# Patient Record
Sex: Female | Born: 1950 | Race: White | Hispanic: No | Marital: Married | Smoking: Current every day smoker
Health system: Southern US, Community
[De-identification: ages and names within clinical notes are randomized; demographics above are authoritative.]

## PROBLEM LIST (undated history)

## (undated) DIAGNOSIS — F419 Anxiety disorder, unspecified: Secondary | ICD-10-CM

## (undated) DIAGNOSIS — Z9181 History of falling: Secondary | ICD-10-CM

## (undated) DIAGNOSIS — R918 Other nonspecific abnormal finding of lung field: Secondary | ICD-10-CM

## (undated) DIAGNOSIS — Z95828 Presence of other vascular implants and grafts: Secondary | ICD-10-CM

## (undated) DIAGNOSIS — Z923 Personal history of irradiation: Secondary | ICD-10-CM

## (undated) DIAGNOSIS — D496 Neoplasm of unspecified behavior of brain: Secondary | ICD-10-CM

## (undated) HISTORY — PX: COLON SURGERY: SHX602

## (undated) HISTORY — PX: APPENDECTOMY: SHX54

---

## 2005-01-24 ENCOUNTER — Ambulatory Visit: Payer: Self-pay

## 2006-02-13 ENCOUNTER — Ambulatory Visit: Payer: Self-pay

## 2007-02-19 ENCOUNTER — Ambulatory Visit: Payer: Self-pay

## 2007-03-06 ENCOUNTER — Ambulatory Visit: Payer: Self-pay | Admitting: Internal Medicine

## 2008-01-13 ENCOUNTER — Emergency Department: Payer: Self-pay | Admitting: Emergency Medicine

## 2008-01-13 ENCOUNTER — Ambulatory Visit: Payer: Self-pay | Admitting: Internal Medicine

## 2008-03-10 ENCOUNTER — Ambulatory Visit: Payer: Self-pay

## 2009-03-12 ENCOUNTER — Ambulatory Visit: Payer: Self-pay

## 2009-12-15 ENCOUNTER — Emergency Department: Payer: Self-pay | Admitting: Emergency Medicine

## 2010-03-18 ENCOUNTER — Ambulatory Visit: Payer: Self-pay | Admitting: Internal Medicine

## 2010-03-29 ENCOUNTER — Ambulatory Visit: Payer: Self-pay

## 2010-04-09 ENCOUNTER — Ambulatory Visit: Payer: Self-pay | Admitting: Unknown Physician Specialty

## 2010-04-12 LAB — PATHOLOGY REPORT

## 2010-04-20 ENCOUNTER — Ambulatory Visit: Payer: Self-pay

## 2010-08-18 ENCOUNTER — Ambulatory Visit: Payer: Self-pay | Admitting: Unknown Physician Specialty

## 2010-08-20 LAB — PATHOLOGY REPORT

## 2011-04-20 ENCOUNTER — Ambulatory Visit: Payer: Self-pay

## 2012-02-02 ENCOUNTER — Ambulatory Visit: Payer: Self-pay | Admitting: Medical

## 2012-04-24 ENCOUNTER — Ambulatory Visit: Payer: Self-pay

## 2013-11-11 ENCOUNTER — Inpatient Hospital Stay (HOSPITAL_COMMUNITY): Payer: BC Managed Care – PPO

## 2013-11-11 ENCOUNTER — Inpatient Hospital Stay (HOSPITAL_COMMUNITY)
Admission: EM | Admit: 2013-11-11 | Discharge: 2013-12-06 | DRG: 826 | Disposition: A | Discharge status: Rehabilitation Facility | Payer: BC Managed Care – PPO | Source: Other Acute Inpatient Hospital | Attending: Hematology & Oncology | Admitting: Hematology & Oncology

## 2013-11-11 ENCOUNTER — Emergency Department: Payer: Self-pay | Admitting: Emergency Medicine

## 2013-11-11 ENCOUNTER — Encounter (HOSPITAL_COMMUNITY): Payer: Self-pay | Admitting: *Deleted

## 2013-11-11 DIAGNOSIS — C719 Malignant neoplasm of brain, unspecified: Secondary | ICD-10-CM

## 2013-11-11 DIAGNOSIS — G935 Compression of brain: Secondary | ICD-10-CM | POA: Diagnosis present

## 2013-11-11 DIAGNOSIS — E440 Moderate protein-calorie malnutrition: Secondary | ICD-10-CM | POA: Insufficient documentation

## 2013-11-11 DIAGNOSIS — R599 Enlarged lymph nodes, unspecified: Secondary | ICD-10-CM | POA: Diagnosis present

## 2013-11-11 DIAGNOSIS — Z79899 Other long term (current) drug therapy: Secondary | ICD-10-CM

## 2013-11-11 DIAGNOSIS — Z681 Body mass index (BMI) 19 or less, adult: Secondary | ICD-10-CM

## 2013-11-11 DIAGNOSIS — C3490 Malignant neoplasm of unspecified part of unspecified bronchus or lung: Secondary | ICD-10-CM

## 2013-11-11 DIAGNOSIS — R739 Hyperglycemia, unspecified: Secondary | ICD-10-CM | POA: Diagnosis present

## 2013-11-11 DIAGNOSIS — T380X5A Adverse effect of glucocorticoids and synthetic analogues, initial encounter: Secondary | ICD-10-CM

## 2013-11-11 DIAGNOSIS — W19XXXA Unspecified fall, initial encounter: Secondary | ICD-10-CM

## 2013-11-11 DIAGNOSIS — F411 Generalized anxiety disorder: Secondary | ICD-10-CM | POA: Diagnosis present

## 2013-11-11 DIAGNOSIS — R4182 Altered mental status, unspecified: Secondary | ICD-10-CM

## 2013-11-11 DIAGNOSIS — S0990XA Unspecified injury of head, initial encounter: Secondary | ICD-10-CM | POA: Diagnosis present

## 2013-11-11 DIAGNOSIS — R55 Syncope and collapse: Secondary | ICD-10-CM | POA: Diagnosis present

## 2013-11-11 DIAGNOSIS — J9600 Acute respiratory failure, unspecified whether with hypoxia or hypercapnia: Secondary | ICD-10-CM

## 2013-11-11 DIAGNOSIS — D496 Neoplasm of unspecified behavior of brain: Secondary | ICD-10-CM

## 2013-11-11 DIAGNOSIS — J189 Pneumonia, unspecified organism: Secondary | ICD-10-CM | POA: Diagnosis not present

## 2013-11-11 DIAGNOSIS — R918 Other nonspecific abnormal finding of lung field: Secondary | ICD-10-CM

## 2013-11-11 DIAGNOSIS — C7A090 Malignant carcinoid tumor of the bronchus and lung: Secondary | ICD-10-CM | POA: Diagnosis present

## 2013-11-11 DIAGNOSIS — Y838 Other surgical procedures as the cause of abnormal reaction of the patient, or of later complication, without mention of misadventure at the time of the procedure: Secondary | ICD-10-CM | POA: Diagnosis not present

## 2013-11-11 DIAGNOSIS — R7309 Other abnormal glucose: Secondary | ICD-10-CM | POA: Diagnosis present

## 2013-11-11 DIAGNOSIS — E43 Unspecified severe protein-calorie malnutrition: Secondary | ICD-10-CM

## 2013-11-11 DIAGNOSIS — Z9181 History of falling: Secondary | ICD-10-CM

## 2013-11-11 DIAGNOSIS — Z781 Physical restraint status: Secondary | ICD-10-CM | POA: Diagnosis not present

## 2013-11-11 DIAGNOSIS — G988 Other disorders of nervous system: Secondary | ICD-10-CM | POA: Diagnosis not present

## 2013-11-11 DIAGNOSIS — Z7982 Long term (current) use of aspirin: Secondary | ICD-10-CM

## 2013-11-11 DIAGNOSIS — R64 Cachexia: Secondary | ICD-10-CM

## 2013-11-11 DIAGNOSIS — C7931 Secondary malignant neoplasm of brain: Secondary | ICD-10-CM

## 2013-11-11 DIAGNOSIS — R636 Underweight: Secondary | ICD-10-CM

## 2013-11-11 DIAGNOSIS — C7949 Secondary malignant neoplasm of other parts of nervous system: Secondary | ICD-10-CM

## 2013-11-11 DIAGNOSIS — C7B8 Other secondary neuroendocrine tumors: Principal | ICD-10-CM | POA: Diagnosis present

## 2013-11-11 DIAGNOSIS — F17200 Nicotine dependence, unspecified, uncomplicated: Secondary | ICD-10-CM | POA: Diagnosis present

## 2013-11-11 DIAGNOSIS — I10 Essential (primary) hypertension: Secondary | ICD-10-CM

## 2013-11-11 DIAGNOSIS — E785 Hyperlipidemia, unspecified: Secondary | ICD-10-CM | POA: Diagnosis present

## 2013-11-11 DIAGNOSIS — G936 Cerebral edema: Secondary | ICD-10-CM | POA: Diagnosis present

## 2013-11-11 DIAGNOSIS — W1809XA Striking against other object with subsequent fall, initial encounter: Secondary | ICD-10-CM | POA: Diagnosis present

## 2013-11-11 DIAGNOSIS — G910 Communicating hydrocephalus: Secondary | ICD-10-CM | POA: Diagnosis present

## 2013-11-11 HISTORY — DX: Anxiety disorder, unspecified: F41.9

## 2013-11-11 HISTORY — DX: Neoplasm of unspecified behavior of brain: D49.6

## 2013-11-11 HISTORY — DX: History of falling: Z91.81

## 2013-11-11 HISTORY — DX: Other nonspecific abnormal finding of lung field: R91.8

## 2013-11-11 LAB — CBC
HCT: 40.6 % (ref 35.0–47.0)
HGB: 14 g/dL (ref 12.0–16.0)
MCH: 32.4 pg (ref 26.0–34.0)
MCHC: 34.4 g/dL (ref 32.0–36.0)
MCV: 94 fL (ref 80–100)
Platelet: 187 10*3/uL (ref 150–440)
RBC: 4.31 10*6/uL (ref 3.80–5.20)
RDW: 13.2 % (ref 11.5–14.5)
WBC: 7.6 10*3/uL (ref 3.6–11.0)

## 2013-11-11 LAB — COMPREHENSIVE METABOLIC PANEL
ALBUMIN: 3.7 g/dL (ref 3.4–5.0)
ANION GAP: 6 — AB (ref 7–16)
AST: 44 U/L — AB (ref 15–37)
Alkaline Phosphatase: 31 U/L — ABNORMAL LOW
BUN: 18 mg/dL (ref 7–18)
Bilirubin,Total: 1.3 mg/dL — ABNORMAL HIGH (ref 0.2–1.0)
CHLORIDE: 105 mmol/L (ref 98–107)
CO2: 24 mmol/L (ref 21–32)
Calcium, Total: 8.9 mg/dL (ref 8.5–10.1)
Creatinine: 0.69 mg/dL (ref 0.60–1.30)
EGFR (African American): >60
Glucose: 88 mg/dL (ref 65–99)
Osmolality: 271 (ref 275–301)
POTASSIUM: 4 mmol/L (ref 3.5–5.1)
SGPT (ALT): 36 U/L (ref 12–78)
SODIUM: 135 mmol/L — AB (ref 136–145)
TOTAL PROTEIN: 6.6 g/dL (ref 6.4–8.2)

## 2013-11-11 LAB — MRSA PCR SCREENING: MRSA by PCR: NEGATIVE

## 2013-11-11 LAB — PROTIME-INR
INR: 1
Prothrombin Time: 13.1 secs (ref 11.5–14.7)

## 2013-11-11 MED ORDER — GADOBENATE DIMEGLUMINE 529 MG/ML IV SOLN
10.0000 mL | Freq: Once | INTRAVENOUS | Status: AC | PRN
  Administered 2013-11-11: 10 mL via INTRAVENOUS

## 2013-11-11 MED ORDER — FAMOTIDINE IN NACL 20-0.9 MG/50ML-% IV SOLN
20.0000 mg | Freq: Two times a day (BID) | INTRAVENOUS | Status: DC
  Administered 2013-11-11 – 2013-11-14 (×7): 20 mg via INTRAVENOUS
  Filled 2013-11-11 (×9): qty 50

## 2013-11-11 MED ORDER — ONDANSETRON HCL 4 MG/2ML IJ SOLN: 4.0000 mg | Freq: Four times a day (QID) | INTRAMUSCULAR | Status: DC | PRN

## 2013-11-11 MED ORDER — SODIUM CHLORIDE 0.9 % IV SOLN
INTRAVENOUS | Status: DC
  Administered 2013-11-11 – 2013-11-15 (×6): via INTRAVENOUS
  Administered 2013-11-17: 20 mL via INTRAVENOUS
  Administered 2013-11-20 – 2013-11-30 (×4): via INTRAVENOUS

## 2013-11-11 MED ORDER — ONDANSETRON HCL 4 MG PO TABS: 4.0000 mg | ORAL_TABLET | Freq: Four times a day (QID) | ORAL | Status: DC | PRN

## 2013-11-11 MED ORDER — DEXAMETHASONE SODIUM PHOSPHATE 10 MG/ML IJ SOLN
10.0000 mg | Freq: Four times a day (QID) | INTRAMUSCULAR | Status: DC
  Administered 2013-11-12 – 2013-11-26 (×58): 10 mg via INTRAVENOUS
  Filled 2013-11-11 (×70): qty 1

## 2013-11-11 MED ORDER — LORAZEPAM BOLUS VIA INFUSION
0.5000 mg | INTRAVENOUS | Status: DC | PRN
  Filled 2013-11-11: qty 1

## 2013-11-11 MED ORDER — LORAZEPAM 2 MG/ML IJ SOLN
0.5000 mg | INTRAMUSCULAR | Status: DC | PRN
  Administered 2013-11-11: 0.5 mg via INTRAVENOUS
  Filled 2013-11-11: qty 1

## 2013-11-11 NOTE — H&P (Signed)
Katie [name] is an 63 y.o. female.   Chief Complaint: New diagnosis brain tumor HPI: Patient is a 63 year old right-handed female who apparently had a syncopal episode today falling and hitting her head in the left frontal region. A CT scan of the brain demonstrates at least 2 large right-sided lesions one frontally and one parietal occipitally there is 12 mm of shift with subfalcine herniation. Was contacted regarding the patient's condition noting that she was somewhat disoriented.  In talking to the husband it appears that she has been failing in her normal activities of daily living for the last 6 weeks time. She did become confused disoriented had some speech difficulties and was not doing routine household chores. She was to see a neurologist in the next week's time.  The patient gives no history of having had any other cancerous lesions ever.  Past Medical History  Diagnosis Date  . Anxiety     takes lexapro    Past Surgical History  Procedure Laterality Date  . Appendectomy    . Colon surgery      polyps  removed    History reviewed. No pertinent family history. Social History:  reports that she has been smoking.  She does not have any smokeless tobacco history on file. Her alcohol and drug histories are not on file.  Allergies: No Known Allergies  No prescriptions prior to admission    No results found for this or any previous visit (from the past 48 hour(s)). No results found.  Review of Systems  Unable to perform ROS: mental acuity    Temperature 98.1 F (36.7 C), temperature source Oral, height 5\' 7"  (1.702 m), weight 53.9 kg (118 lb 13.3 oz). Physical Exam  Constitutional: She appears well-developed and well-nourished.  HENT:  Head: Normocephalic.  Small left frontal contusion and abrasion on scalp at coronal suture  Eyes: Conjunctivae and EOM are normal. Pupils are equal, round, and reactive to light.  Neck: Normal range of motion. Neck supple.  Cardiovascular:  Normal rate, regular rhythm and normal heart sounds.   Respiratory: Effort normal and breath sounds normal.  GI: Soft. Bowel sounds are normal.  Musculoskeletal:  All 4 extremities move well. But not to command  Neurological: She has normal reflexes. No cranial nerve deficit.  Patient appears obtunded. Will not open eyes to painful stimuli but grimaces symmetrically. Moves arms purposefully on both sides. Moves legs spontaneously. Patient was given a half milligram of Ativan prior to transport to [place].  Skin: Skin is warm and dry.     Assessment/Plan New diagnosis of brain lesions in this 63 year old otherwise healthy individual.  She will require an MRI of the brain in addition to a CT of the chest abdomen and pelvis to look for primary lesion. The patient will require surgical removal of the largest lesion which is in the right parieto-occipital region. The MRI will demonstrated there are any other than these 2 notable lesions on the right side of her head.  Katie [name] J 11/11/2013, 9:53 PM

## 2013-11-12 ENCOUNTER — Inpatient Hospital Stay (HOSPITAL_COMMUNITY): Payer: BC Managed Care – PPO

## 2013-11-12 ENCOUNTER — Encounter (HOSPITAL_COMMUNITY): Payer: Self-pay | Admitting: Certified Registered Nurse Anesthetist

## 2013-11-12 ENCOUNTER — Inpatient Hospital Stay (HOSPITAL_COMMUNITY): Payer: BC Managed Care – PPO | Admitting: Certified Registered"

## 2013-11-12 ENCOUNTER — Encounter (HOSPITAL_COMMUNITY): Payer: BC Managed Care – PPO | Admitting: Anesthesiology

## 2013-11-12 ENCOUNTER — Encounter (HOSPITAL_COMMUNITY)
Admission: EM | Disposition: A | Discharge status: Rehabilitation Facility | Payer: Self-pay | Source: Other Acute Inpatient Hospital | Attending: Hematology & Oncology

## 2013-11-12 ENCOUNTER — Inpatient Hospital Stay (HOSPITAL_COMMUNITY): Payer: BC Managed Care – PPO | Admitting: Anesthesiology

## 2013-11-12 ENCOUNTER — Encounter (HOSPITAL_COMMUNITY): Payer: BC Managed Care – PPO | Admitting: Certified Registered"

## 2013-11-12 DIAGNOSIS — R4182 Altered mental status, unspecified: Secondary | ICD-10-CM

## 2013-11-12 DIAGNOSIS — J9600 Acute respiratory failure, unspecified whether with hypoxia or hypercapnia: Secondary | ICD-10-CM | POA: Diagnosis present

## 2013-11-12 DIAGNOSIS — C719 Malignant neoplasm of brain, unspecified: Secondary | ICD-10-CM

## 2013-11-12 HISTORY — PX: CRANIOTOMY: SHX93

## 2013-11-12 LAB — COMPREHENSIVE METABOLIC PANEL
ALT: 25 U/L (ref 0–35)
AST: 39 U/L — ABNORMAL HIGH (ref 0–37)
Albumin: 3.7 g/dL (ref 3.5–5.2)
Alkaline Phosphatase: 29 U/L — ABNORMAL LOW (ref 39–117)
BILIRUBIN TOTAL: 1.3 mg/dL — AB (ref 0.3–1.2)
BUN: 14 mg/dL (ref 6–23)
CHLORIDE: 100 meq/L (ref 96–112)
CO2: 22 mEq/L (ref 19–32)
CREATININE: 0.56 mg/dL (ref 0.50–1.10)
Calcium: 9.4 mg/dL (ref 8.4–10.5)
GFR calc Af Amer: >90 mL/min (ref >90)
GFR calc non Af Amer: >90 mL/min (ref >90)
Glucose, Bld: 138 mg/dL — ABNORMAL HIGH (ref 70–99)
Potassium: 4 mEq/L (ref 3.7–5.3)
Sodium: 138 mEq/L (ref 137–147)
Total Protein: 6.1 g/dL (ref 6.0–8.3)

## 2013-11-12 LAB — POCT I-STAT 7, (LYTES, BLD GAS, ICA,H+H)
ACID-BASE EXCESS: 4 mmol/L — AB (ref 0.0–2.0)
Acid-Base Excess: 1 mmol/L (ref 0.0–2.0)
Acid-base deficit: 1 mmol/L (ref 0.0–2.0)
BICARBONATE: 26.7 meq/L — AB (ref 20.0–24.0)
BICARBONATE: 26.9 meq/L — AB (ref 20.0–24.0)
BICARBONATE: 24.8 meq/L — AB (ref 20.0–24.0)
CALCIUM ION: 1.12 mmol/L — AB (ref 1.13–1.30)
CALCIUM ION: 1.16 mmol/L (ref 1.13–1.30)
Calcium, Ion: 1.13 mmol/L (ref 1.13–1.30)
HCT: 34 % — ABNORMAL LOW (ref 36.0–46.0)
HCT: 27 % — ABNORMAL LOW (ref 36.0–46.0)
HEMATOCRIT: 34 % — AB (ref 36.0–46.0)
HEMOGLOBIN: 11.6 g/dL — AB (ref 12.0–15.0)
HEMOGLOBIN: 11.6 g/dL — AB (ref 12.0–15.0)
HEMOGLOBIN: 9.2 g/dL — AB (ref 12.0–15.0)
O2 SAT: 100 %
O2 Saturation: 100 %
O2 Saturation: 100 %
PH ART: 7.556 — AB (ref 7.350–7.450)
PH ART: 7.385 (ref 7.350–7.450)
PH ART: 7.36 (ref 7.350–7.450)
PO2 ART: 472 mmHg — AB (ref 80.0–100.0)
Patient temperature: 36.3
Patient temperature: 36
Potassium: 3.4 mEq/L — ABNORMAL LOW (ref 3.7–5.3)
Potassium: 3.4 mEq/L — ABNORMAL LOW (ref 3.7–5.3)
Potassium: 3.3 mEq/L — ABNORMAL LOW (ref 3.7–5.3)
SODIUM: 134 meq/L — AB (ref 137–147)
Sodium: 131 mEq/L — ABNORMAL LOW (ref 137–147)
Sodium: 139 mEq/L (ref 137–147)
TCO2: 28 mmol/L (ref 0–100)
TCO2: 28 mmol/L (ref 0–100)
TCO2: 26 mmol/L (ref 0–100)
pCO2 arterial: 29.6 mmHg — ABNORMAL LOW (ref 35.0–45.0)
pCO2 arterial: 44.6 mmHg (ref 35.0–45.0)
pCO2 arterial: 43.4 mmHg (ref 35.0–45.0)
pO2, Arterial: 492 mmHg — ABNORMAL HIGH (ref 80.0–100.0)
pO2, Arterial: 518 mmHg — ABNORMAL HIGH (ref 80.0–100.0)

## 2013-11-12 LAB — GLUCOSE, CAPILLARY
GLUCOSE-CAPILLARY: 132 mg/dL — AB (ref 70–99)
GLUCOSE-CAPILLARY: 159 mg/dL — AB (ref 70–99)

## 2013-11-12 LAB — POCT I-STAT 3, ART BLOOD GAS (G3+)
ACID-BASE DEFICIT: 1 mmol/L (ref 0.0–2.0)
Acid-base deficit: 1 mmol/L (ref 0.0–2.0)
BICARBONATE: 23.7 meq/L (ref 20.0–24.0)
Bicarbonate: 24.4 mEq/L — ABNORMAL HIGH (ref 20.0–24.0)
O2 SAT: 100 %
O2 Saturation: 99 %
Patient temperature: 97.5
Patient temperature: 97.9
TCO2: 26 mmol/L (ref 0–100)
TCO2: 25 mmol/L (ref 0–100)
pCO2 arterial: 44 mmHg (ref 35.0–45.0)
pCO2 arterial: 37.8 mmHg (ref 35.0–45.0)
pH, Arterial: 7.349 — ABNORMAL LOW (ref 7.350–7.450)
pH, Arterial: 7.403 (ref 7.350–7.450)
pO2, Arterial: 367 mmHg — ABNORMAL HIGH (ref 80.0–100.0)
pO2, Arterial: 117 mmHg — ABNORMAL HIGH (ref 80.0–100.0)

## 2013-11-12 LAB — MAGNESIUM: Magnesium: 1.8 mg/dL (ref 1.5–2.5)

## 2013-11-12 LAB — PREPARE RBC (CROSSMATCH)

## 2013-11-12 LAB — ABO/RH: ABO/RH(D): A NEG

## 2013-11-12 LAB — PHOSPHORUS: Phosphorus: 3.7 mg/dL (ref 2.3–4.6)

## 2013-11-12 SURGERY — CRANIOTOMY HEMATOMA EVACUATION SUBDURAL
Anesthesia: General | Site: Head | Laterality: Right

## 2013-11-12 MED ORDER — ROCURONIUM BROMIDE 50 MG/5ML IV SOLN
50.0000 mg | INTRAVENOUS | Status: DC
  Filled 2013-11-12: qty 5

## 2013-11-12 MED ORDER — BUPIVACAINE HCL (PF) 0.5 % IJ SOLN
INTRAMUSCULAR | Status: DC | PRN
  Administered 2013-11-12: 5 mL

## 2013-11-12 MED ORDER — MANNITOL 25 % IV SOLN
INTRAVENOUS | Status: AC
  Filled 2013-11-12: qty 150

## 2013-11-12 MED ORDER — SODIUM CHLORIDE 0.9 % IV SOLN
INTRAVENOUS | Status: DC | PRN
  Administered 2013-11-12: 08:00:00 via INTRAVENOUS

## 2013-11-12 MED ORDER — FENTANYL CITRATE 0.05 MG/ML IJ SOLN
200.0000 ug | Freq: Once | INTRAMUSCULAR | Status: AC
  Administered 2013-11-12: 200 ug via INTRAVENOUS

## 2013-11-12 MED ORDER — PANTOPRAZOLE SODIUM 40 MG IV SOLR
40.0000 mg | Freq: Every day | INTRAVENOUS | Status: DC
  Administered 2013-11-12 – 2013-11-14 (×3): 40 mg via INTRAVENOUS
  Filled 2013-11-12 (×4): qty 40

## 2013-11-12 MED ORDER — MANNITOL 25 % IV SOLN
INTRAVENOUS | Status: AC
  Filled 2013-11-12: qty 50

## 2013-11-12 MED ORDER — SODIUM CHLORIDE 0.9 % IV SOLN
INTRAVENOUS | Status: DC
Start: 2013-11-12 — End: 2013-11-13
  Administered 2013-11-13: via INTRAVENOUS

## 2013-11-12 MED ORDER — SUFENTANIL CITRATE 50 MCG/ML IV SOLN
INTRAVENOUS | Status: AC
  Filled 2013-11-12: qty 1

## 2013-11-12 MED ORDER — LEVETIRACETAM 500 MG/5ML IV SOLN
500.0000 mg | Freq: Two times a day (BID) | INTRAVENOUS | Status: DC
Start: 2013-11-12 — End: 2013-11-15
  Administered 2013-11-12 – 2013-11-14 (×6): 500 mg via INTRAVENOUS
  Filled 2013-11-12 (×7): qty 5

## 2013-11-12 MED ORDER — THROMBIN 5000 UNITS EX SOLR
OROMUCOSAL | Status: DC | PRN
  Administered 2013-11-12 (×3): via TOPICAL

## 2013-11-12 MED ORDER — ONDANSETRON HCL 4 MG/2ML IJ SOLN: 4.0000 mg | INTRAMUSCULAR | Status: DC | PRN

## 2013-11-12 MED ORDER — PHENYLEPHRINE 40 MCG/ML (10ML) SYRINGE FOR IV PUSH (FOR BLOOD PRESSURE SUPPORT)
PREFILLED_SYRINGE | INTRAVENOUS | Status: AC
  Filled 2013-11-12: qty 10

## 2013-11-12 MED ORDER — CHLORHEXIDINE GLUCONATE 0.12 % MT SOLN
15.0000 mL | Freq: Two times a day (BID) | OROMUCOSAL | Status: DC
  Administered 2013-11-12 – 2013-11-15 (×7): 15 mL via OROMUCOSAL
  Filled 2013-11-12 (×6): qty 15

## 2013-11-12 MED ORDER — ADULT MULTIVITAMIN LIQUID CH
5.0000 mL | Freq: Every day | ORAL | Status: DC
  Administered 2013-11-12 – 2013-11-26 (×15): 5 mL
  Filled 2013-11-12 (×18): qty 5

## 2013-11-12 MED ORDER — LIDOCAINE-EPINEPHRINE 1 %-1:100000 IJ SOLN
INTRAMUSCULAR | Status: DC | PRN
  Administered 2013-11-12: 5 mL via INTRADERMAL

## 2013-11-12 MED ORDER — SUFENTANIL CITRATE 50 MCG/ML IV SOLN
INTRAVENOUS | Status: DC | PRN
  Administered 2013-11-12 (×2): 10 ug via INTRAVENOUS
  Administered 2013-11-12: 20 ug via INTRAVENOUS
  Administered 2013-11-12: 10 ug via INTRAVENOUS

## 2013-11-12 MED ORDER — PROPOFOL 10 MG/ML IV EMUL
INTRAVENOUS | Status: AC
  Filled 2013-11-12: qty 100

## 2013-11-12 MED ORDER — FENTANYL CITRATE 0.05 MG/ML IJ SOLN
25.0000 ug | INTRAMUSCULAR | Status: DC | PRN
  Administered 2013-11-14 (×2): 50 ug via INTRAVENOUS
  Filled 2013-11-12 (×2): qty 2

## 2013-11-12 MED ORDER — DEXTROSE 5 % IV SOLN
10.0000 mg | INTRAVENOUS | Status: DC | PRN
  Administered 2013-11-12: 25 ug/min via INTRAVENOUS

## 2013-11-12 MED ORDER — THROMBIN 20000 UNITS EX KIT
PACK | CUTANEOUS | Status: DC | PRN
  Administered 2013-11-12: 07:00:00 via TOPICAL

## 2013-11-12 MED ORDER — SODIUM CHLORIDE 0.9 % IJ SOLN
INTRAMUSCULAR | Status: AC
  Filled 2013-11-12: qty 10

## 2013-11-12 MED ORDER — ROCURONIUM BROMIDE 50 MG/5ML IV SOLN: 1.0000 mg/kg | Freq: Once | INTRAVENOUS | Status: DC

## 2013-11-12 MED ORDER — SUCCINYLCHOLINE CHLORIDE 20 MG/ML IJ SOLN
INTRAMUSCULAR | Status: DC | PRN
  Administered 2013-11-12: 120 mg via INTRAVENOUS

## 2013-11-12 MED ORDER — CEFAZOLIN SODIUM-DEXTROSE 2-3 GM-% IV SOLR
INTRAVENOUS | Status: AC
  Filled 2013-11-12: qty 50

## 2013-11-12 MED ORDER — PROMETHAZINE HCL 25 MG PO TABS: 12.5000 mg | ORAL_TABLET | ORAL | Status: DC | PRN

## 2013-11-12 MED ORDER — ROCURONIUM BROMIDE 100 MG/10ML IV SOLN
INTRAVENOUS | Status: DC | PRN
  Administered 2013-11-12: 20 mg via INTRAVENOUS
  Administered 2013-11-12: 30 mg via INTRAVENOUS
  Administered 2013-11-12: 50 mg via INTRAVENOUS

## 2013-11-12 MED ORDER — FENTANYL CITRATE 0.05 MG/ML IJ SOLN
INTRAMUSCULAR | Status: AC
  Filled 2013-11-12: qty 8

## 2013-11-12 MED ORDER — MANNITOL 25 % IV SOLN
50.0000 g | Freq: Once | INTRAVENOUS | Status: AC
  Administered 2013-11-12: 50 g via INTRAVENOUS

## 2013-11-12 MED ORDER — LABETALOL HCL 5 MG/ML IV SOLN
10.0000 mg | INTRAVENOUS | Status: DC | PRN
  Administered 2013-11-16 – 2013-11-17 (×2): 10 mg via INTRAVENOUS
  Filled 2013-11-12: qty 4
  Filled 2013-11-12 (×2): qty 8

## 2013-11-12 MED ORDER — SODIUM CHLORIDE 0.9 % IR SOLN
Status: DC | PRN
  Administered 2013-11-12: 07:00:00

## 2013-11-12 MED ORDER — CEFAZOLIN SODIUM-DEXTROSE 2-3 GM-% IV SOLR
INTRAVENOUS | Status: DC | PRN
  Administered 2013-11-12: 2 g via INTRAVENOUS

## 2013-11-12 MED ORDER — PIVOT 1.5 CAL PO LIQD
1000.0000 mL | ORAL | Status: DC
  Administered 2013-11-13 – 2013-11-15 (×2): 1000 mL
  Filled 2013-11-12 (×5): qty 1000

## 2013-11-12 MED ORDER — ONDANSETRON HCL 4 MG PO TABS: 4.0000 mg | ORAL_TABLET | ORAL | Status: DC | PRN

## 2013-11-12 MED ORDER — VECURONIUM BROMIDE 10 MG IV SOLR
INTRAVENOUS | Status: DC | PRN
  Administered 2013-11-12: 2 mg via INTRAVENOUS

## 2013-11-12 MED ORDER — ROCURONIUM BROMIDE 50 MG/5ML IV SOLN
INTRAVENOUS | Status: AC
  Filled 2013-11-12: qty 2

## 2013-11-12 MED ORDER — PROPOFOL 10 MG/ML IV EMUL
5.0000 ug/kg/min | INTRAVENOUS | Status: DC
  Administered 2013-11-12: 10 ug/kg/min via INTRAVENOUS
  Administered 2013-11-13: 30 ug/kg/min via INTRAVENOUS
  Administered 2013-11-13: 40 ug/kg/min via INTRAVENOUS
  Administered 2013-11-14: 10 ug/kg/min via INTRAVENOUS
  Administered 2013-11-14 (×2): 40 ug/kg/min via INTRAVENOUS
  Filled 2013-11-12 (×5): qty 100

## 2013-11-12 MED ORDER — LACTATED RINGERS IV SOLN
INTRAVENOUS | Status: DC | PRN
  Administered 2013-11-12: 06:00:00 via INTRAVENOUS

## 2013-11-12 MED ORDER — BIOTENE DRY MOUTH MT LIQD
15.0000 mL | Freq: Four times a day (QID) | OROMUCOSAL | Status: DC
  Administered 2013-11-12 – 2013-12-06 (×91): 15 mL via OROMUCOSAL

## 2013-11-12 MED ORDER — 0.9 % SODIUM CHLORIDE (POUR BTL) OPTIME
TOPICAL | Status: DC | PRN
  Administered 2013-11-12 (×3): 1000 mL

## 2013-11-12 MED ORDER — PROPOFOL 10 MG/ML IV BOLUS
INTRAVENOUS | Status: DC | PRN
  Administered 2013-11-12: 200 mg via INTRAVENOUS

## 2013-11-12 SURGICAL SUPPLY — 76 items
BAG DECANTER FOR FLEXI CONT (MISCELLANEOUS) ×2 IMPLANT
BANDAGE GAUZE ELAST BULKY 4 IN (GAUZE/BANDAGES/DRESSINGS) ×2 IMPLANT
BIT DRILL WIRE PASS 1.3MM (BIT) IMPLANT
BRUSH SCRUB EZ PLAIN DRY (MISCELLANEOUS) ×2 IMPLANT
BUR ACORN 6.0 (BURR) ×2 IMPLANT
BUR ADDG 1.1 (BURR) IMPLANT
BUR ROUTER D-58 CRANI (BURR) IMPLANT
CANISTER SUCT 3000ML (MISCELLANEOUS) ×2 IMPLANT
CLIP TI MEDIUM 6 (CLIP) IMPLANT
CONT SPEC 4OZ CLIKSEAL STRL BL (MISCELLANEOUS) ×6 IMPLANT
CORDS BIPOLAR (ELECTRODE) ×2 IMPLANT
DECANTER SPIKE VIAL GLASS SM (MISCELLANEOUS) ×2 IMPLANT
DRAIN CHANNEL 10M FLAT 3/4 FLT (DRAIN) IMPLANT
DRAIN PENROSE 1/2X12 LTX STRL (WOUND CARE) IMPLANT
DRAPE SURG IRRIG POUCH 19X23 (DRAPES) IMPLANT
DRAPE WARM FLUID 44X44 (DRAPE) ×2 IMPLANT
DRESSING TELFA 8X3 (GAUZE/BANDAGES/DRESSINGS) ×2 IMPLANT
DRILL WIRE PASS 1.3MM (BIT)
DRSG ADAPTIC 3X8 NADH LF (GAUZE/BANDAGES/DRESSINGS) IMPLANT
DRSG OPSITE 4X5.5 SM (GAUZE/BANDAGES/DRESSINGS) ×2 IMPLANT
DURAPREP 6ML APPLICATOR 50/CS (WOUND CARE) ×2 IMPLANT
ELECT CAUTERY BLADE 6.4 (BLADE) ×2 IMPLANT
ELECT REM PT RETURN 9FT ADLT (ELECTROSURGICAL) ×2
ELECTRODE REM PT RTRN 9FT ADLT (ELECTROSURGICAL) ×1 IMPLANT
EVACUATOR SILICONE 100CC (DRAIN) IMPLANT
FORCEPS BIPOLAR SPETZLER 8 1.0 (NEUROSURGERY SUPPLIES) ×2 IMPLANT
GAUZE SPONGE 4X4 16PLY XRAY LF (GAUZE/BANDAGES/DRESSINGS) IMPLANT
GLOVE BIO SURGEON STRL SZ7.5 (GLOVE) IMPLANT
GLOVE BIOGEL PI IND STRL 7.5 (GLOVE) IMPLANT
GLOVE BIOGEL PI IND STRL 8.5 (GLOVE) ×1 IMPLANT
GLOVE BIOGEL PI INDICATOR 7.5 (GLOVE)
GLOVE BIOGEL PI INDICATOR 8.5 (GLOVE) ×1
GLOVE ECLIPSE 8.5 STRL (GLOVE) ×4 IMPLANT
GLOVE EXAM NITRILE LRG STRL (GLOVE) IMPLANT
GLOVE EXAM NITRILE MD LF STRL (GLOVE) IMPLANT
GLOVE EXAM NITRILE XL STR (GLOVE) IMPLANT
GLOVE EXAM NITRILE XS STR PU (GLOVE) IMPLANT
GOWN BRE IMP SLV AUR LG STRL (GOWN DISPOSABLE) IMPLANT
GOWN BRE IMP SLV AUR XL STRL (GOWN DISPOSABLE) IMPLANT
GOWN SPEC L3 XXLG W/TWL (GOWN DISPOSABLE) ×2 IMPLANT
GOWN STRL REIN 2XL LVL4 (GOWN DISPOSABLE) IMPLANT
GOWN STRL REUS W/ TWL XL LVL3 (GOWN DISPOSABLE) ×1 IMPLANT
GOWN STRL REUS W/TWL MED LVL3 (GOWN DISPOSABLE) ×2 IMPLANT
GOWN STRL REUS W/TWL XL LVL3 (GOWN DISPOSABLE) ×1
HEMOSTAT POWDER SURGIFOAM 1G (HEMOSTASIS) ×6 IMPLANT
HEMOSTAT SURGICEL 2X14 (HEMOSTASIS) ×2 IMPLANT
HOOK DURA (MISCELLANEOUS) ×2 IMPLANT
KIT BASIN OR (CUSTOM PROCEDURE TRAY) ×2 IMPLANT
KIT ROOM TURNOVER OR (KITS) ×2 IMPLANT
NEEDLE HYPO 22GX1.5 SAFETY (NEEDLE) ×2 IMPLANT
NS IRRIG 1000ML POUR BTL (IV SOLUTION) ×6 IMPLANT
PACK CRANIOTOMY (CUSTOM PROCEDURE TRAY) ×2 IMPLANT
PAD ABD 8X10 STRL (GAUZE/BANDAGES/DRESSINGS) IMPLANT
PATTIES SURGICAL .5 X.5 (GAUZE/BANDAGES/DRESSINGS) IMPLANT
PATTIES SURGICAL .5 X3 (DISPOSABLE) IMPLANT
PATTIES SURGICAL 1X1 (DISPOSABLE) ×8 IMPLANT
PIN MAYFIELD SKULL DISP (PIN) ×2 IMPLANT
PLATE 1.5  2HOLE LNG NEURO (Plate) ×4 IMPLANT
PLATE 1.5 2HOLE LNG NEURO (Plate) ×4 IMPLANT
SCREW SELF DRILL HT 1.5/4MM (Screw) ×16 IMPLANT
SPECIMEN JAR SMALL (MISCELLANEOUS) IMPLANT
SPONGE GAUZE 4X4 12PLY (GAUZE/BANDAGES/DRESSINGS) IMPLANT
SPONGE NEURO XRAY DETECT 1X3 (DISPOSABLE) ×2 IMPLANT
SPONGE SURGIFOAM ABS GEL 100 (HEMOSTASIS) ×4 IMPLANT
STAPLER SKIN PROX WIDE 3.9 (STAPLE) ×2 IMPLANT
SUT ETHILON 3 0 FSL (SUTURE) IMPLANT
SUT NURALON 4 0 TR CR/8 (SUTURE) ×4 IMPLANT
SUT VIC AB 2-0 CP2 18 (SUTURE) ×4 IMPLANT
SYR 20ML ECCENTRIC (SYRINGE) ×2 IMPLANT
SYR CONTROL 10ML LL (SYRINGE) ×2 IMPLANT
TOWEL OR 17X24 6PK STRL BLUE (TOWEL DISPOSABLE) ×2 IMPLANT
TOWEL OR 17X26 10 PK STRL BLUE (TOWEL DISPOSABLE) ×2 IMPLANT
TRAP SPECIMEN MUCOUS 40CC (MISCELLANEOUS) IMPLANT
TRAY FOLEY CATH 14FRSI W/METER (CATHETERS) IMPLANT
UNDERPAD 30X30 INCONTINENT (UNDERPADS AND DIAPERS) IMPLANT
WATER STERILE IRR 1000ML POUR (IV SOLUTION) ×2 IMPLANT

## 2013-11-12 NOTE — Consult Note (Signed)
PULMONARY / CRITICAL CARE MEDICINE   Name: Katie [name] MRN: [id number] DOB: 1950/10/08    ADMISSION DATE:  11/11/2013 CONSULTATION DATE:  11/12/2013  REFERRING MD :  Ellene Route PRIMARY SERVICE: Neurosurgery  CHIEF COMPLAINT:  Vent Management s/p craniotomy and resection of brain tumor.  BRIEF PATIENT DESCRIPTION: 63 y.o. F who underwent right parietal occipital craniotomy and resection of brain tumor 3/24.  PCCM consulted post op for vent management.  SIGNIFICANT EVENTS / STUDIES:  3/23 >>> admitted after syncopal episode, CT head demonstrated 2 large R sided lesions with subfalcine herniation. 3/24 >>> to OR for right parietal occipital craniotomy and resection of brain tumor, back to Neuro ICU on vent.  LINES / TUBES: OETT 3/24 >>> R radial a.line 3/24 >>> OGT 3/24 >>> Foley 3/24 >>>  CULTURES: None  ANTIBIOTICS: None  HISTORY OF PRESENT ILLNESS:  Katie [name] is a 63 y.o. F who apparently had a syncopal episode 3/23 falling and hitting her head in the left frontal region. A CT scan of the brain demonstrated at least 2 large right-sided lesions one frontally and one parietal occipitally there is 12 mm of shift with subfalcine herniation. Neurosurgery was contacted regarding the patient's condition noting that she was somewhat disoriented.  Per NS notes, the husband reported that pt has been failing in her normal activities of daily living for the last 6 weeks. She has become confused, disoriented, and has had some speech difficulties. She was to see a neurologist in the next week's time.   No history of having had any other cancerous lesions ever. Pt went to OR emergently on 3/24 for resection of tumor after she began to develop decorticate posturing while undergoing CT of the chest/abd/pelvis for further workup. Pathologic diagnosis from frozen section was obtained as malignant carcinoma. Pt returned to neuro ICU on vent and PCCM consulted for vent management.  PAST MEDICAL HISTORY :   Past Medical History  Diagnosis Date  . Anxiety     takes lexapro   Past Surgical History  Procedure Laterality Date  . Appendectomy    . Colon surgery      polyps  removed   Prior to Admission medications   Medication Sig Start Date End Date Taking? Authorizing Provider  aspirin EC 81 MG tablet Take 81 mg by mouth daily.   Yes Historical Provider, MD  atorvastatin (LIPITOR) 10 MG tablet Take 10 mg by mouth every evening.   Yes Historical Provider, MD  cyanocobalamin 500 MCG tablet Take 500 mcg by mouth daily.   Yes Historical Provider, MD  escitalopram (LEXAPRO) 10 MG tablet Take 10 mg by mouth every morning.   Yes Historical Provider, MD   No Known Allergies  FAMILY HISTORY:  History reviewed. No pertinent family history. SOCIAL HISTORY:  reports that she has been smoking.  She does not have any smokeless tobacco history on file. Her alcohol and drug histories are not on file.  REVIEW OF SYSTEMS:  Unable to complete as pt is intubated.  SUBJECTIVE:  Stable post op, on vent.  VITAL SIGNS: Temp:  [98.1 F (36.7 C)-99.1 F (37.3 C)] 99.1 F (37.3 C) (03/24 0320) Pulse Rate:  [63-114] 70 (03/24 1130) Resp:  [12-24] 12 (03/24 1130) BP: (83-184)/(42-110) 106/60 mmHg (03/24 1130) SpO2:  [89 %-100 %] 100 % (03/24 1130) Arterial Line BP: (94-140)/(35-55) 140/55 mmHg (03/24 1130) FiO2 (%):  [40 %-100 %] 40 % (03/24 1130) Weight:  [118 lb 13.3 oz (53.9 kg)] 118 lb 13.3 oz (53.9 kg) (  03/23 2107) HEMODYNAMICS:   VENTILATOR SETTINGS: Vent Mode:  [-] PRVC FiO2 (%):  [40 %-100 %] 40 % Set Rate:  [14 bmp-15 bmp] 14 bmp Vt Set:  [470 mL-500 mL] 500 mL PEEP:  [5 cmH20] 5 cmH20 Plateau Pressure:  [12 cmH20-21 cmH20] 21 cmH20 INTAKE / OUTPUT: Intake/Output     03/23 0701 - 03/24 0700 03/24 0701 - 03/25 0700   I.V. (mL/kg) 731.3 (13.6) 2018.8 (37.5)   IV Piggyback 50    Total Intake(mL/kg) 781.3 (14.5) 2018.8 (37.5)   Urine (mL/kg/hr) 500 600 (2.3)   Blood  1100 (4.1)   Total  Output 500 1700   Net +281.3 +318.8        Urine Occurrence 2 x      PHYSICAL EXAMINATION: General:  Acutely ill appearing female, sedated post op. Neuro:  Sedated and paralyzed, not moving any ext. HEENT:  Post crani, pupils non-reactive, no EOM. Cardiovascular:  RRR, Nl S1/S2, -M/R/G. Lungs:  Coarse BS diffusely. Abdomen:  Soft, NT, ND and +BS. Musculoskeletal:  -edema and -tenderness. Skin:  Intact, wound dressed.  LABS:  CBC No results found for this basename: WBC, HGB, HCT, PLT,  in the last 168 hours Coag's No results found for this basename: APTT, INR,  in the last 168 hours BMET  Recent Labs Lab 11/12/13 0055  NA 138  K 4.0  CL 100  CO2 22  BUN 14  CREATININE 0.56  GLUCOSE 138*   Electrolytes  Recent Labs Lab 11/12/13 0055  CALCIUM 9.4  MG 1.8  PHOS 3.7   Sepsis Markers No results found for this basename: LATICACIDVEN, PROCALCITON, O2SATVEN,  in the last 168 hours ABG  Recent Labs Lab 11/12/13 0949  PHART 7.349*  PCO2ART 44.0  PO2ART 367.0*   Liver Enzymes  Recent Labs Lab 11/12/13 0055  AST 39*  ALT 25  ALKPHOS 29*  BILITOT 1.3*  ALBUMIN 3.7   Cardiac Enzymes No results found for this basename: TROPONINI, PROBNP,  in the last 168 hours Glucose No results found for this basename: GLUCAP,  in the last 168 hours  Imaging Mr Jeri Cos Wo Contrast  11/12/2013   CLINICAL DATA:  Syncopal episode, intracranial masses.  EXAM: MRI HEAD WITHOUT AND WITH CONTRAST  TECHNIQUE: Multiplanar, multiecho pulse sequences of the brain and surrounding structures were obtained without and with intravenous contrast for surgical planning.  CONTRAST:  34mL MULTIHANCE GADOBENATE DIMEGLUMINE 529 MG/ML IV SOLN  COMPARISON:  CT HEAD W/O CM dated 11/11/2013; CT HEAD W/O CM dated 12/15/2009  FINDINGS: Moderately motion degraded examination, per technologist patient was unable to remain still for the examination.  Heterogeneous may enhancing right temporal parietal  occipital at least 4.4 x 6 cm (transverse by AP) mass with surrounding T2 FLAIR hyperintense vasogenic edema and possibly nonenhancing tumor. T2 bright signal along the temporal stem extending into the internal capsule, external capsule on the right. Effacement of the right temporal horn, occipital horn with left lateral ventricle/atrial enlargement concerning for entrapment with FLAIR hyperintense probable transependymal flow cerebral spinal fluid. 10 mm of right-to-left midline shift.  Similar characteristic 2.3 x 2.3 cm mass in high right frontal lobe, the gray-white matter junction. Extensor surrounding FLAIR hyperintense signal.  Additional scattered subcentimeter supratentorial white matter lesions, with no definite enhancement. No extra-axial masses no abnormal leptomeningeal enhancement. Effacement of the right quadrigeminal/ambient cistern deforming the mid brain. Mildly effaced suprasellar cistern.  IMPRESSION: Moderately motion degraded limited MRI of the brain for surgical planning.  Heterogeneously enhancing  4.4 x 6 cm right temporal parietal occipital mass with extensive surrounding presumed vasogenic edema, though a component infiltrated nonenhancing tumor not excluded. In addition, similar appearing high right frontal lobe 2.3 x 2.3. Constellation of findings likely reflect metastatic disease, less likely lymphoma or possibly primary brain tumors.  10 mm of right-to-left midline shift with left ventricle entrapment/hydrocephalus. Effaced basal cisterns.  Additional subcentimeter white matter lesions favor chronic small vessel ischemic disease, incompletely characterized on this motion degraded limited MRI.   Electronically Signed   By: Elon Alas   On: 11/12/2013 00:32   Dg Chest Port 1 View  11/12/2013   CLINICAL DATA:  Metastatic brain tumor.  Craniotomy yesterday.  EXAM: PORTABLE CHEST - 1 VIEW  COMPARISON:  11/11/2013  FINDINGS: Endotracheal tube and the NG tube appear in good position.   Lungs appear clear.  No effusions.  No acute osseous abnormality.  IMPRESSION: Clear lungs.   Electronically Signed   By: Rozetta Nunnery M.D.   On: 11/12/2013 10:22   Dg Abd Portable 1v  11/12/2013   CLINICAL DATA:  Nasogastric tube placement.  EXAM: PORTABLE ABDOMEN - 1 VIEW  COMPARISON:  No priors.  FINDINGS: Single view of the abdomen demonstrates a nasogastric tube with tip in the body of the stomach and side port near the gastroesophageal junction. Visualized bowel gas pattern is nonobstructive.  IMPRESSION: 1. Tip of nasogastric tube is in the body of the stomach.   Electronically Signed   By: Vinnie Langton M.D.   On: 11/12/2013 05:05     ASSESSMENT / PLAN:  PULMONARY A: VDRF P:   - Full vent support, wean as able. - ABG done, titrate FiO2 down. - Propofol with PRN fentanyl for sedation. - Daily SBT's. - CXR in AM.  CARDIOVASCULAR A:  BP control P:  - Per Neurosurgery.  RENAL A:   No acute issues P:   - BMP in AM. - Correct lytes as indicated. - IVF as ordered, will KVO once TF are at goal.  GASTROINTESTINAL A:   NPO P:   - SUP: pantoprazole. - Consult nutrition for TF as per nutrition.  HEMATOLOGIC A:  VTE Prophylaxis P:  - SCD's. - Hold anticoags until OK with neurosurgery. - CBC in AM.  INFECTIOUS A:   No acute issues P:   - Monitor WBC's/fever curve.  ENDOCRINE A:   At risk for steroid induced hyperglycemia   P:   - Monitor glucose.  NEUROLOGIC A:   S/p right craniotomy Malignant carcinoma P:   - Per neurosurgery.   Montey Hora, PA - [facility] Pgr: [phone]  or [phone]   I have personally obtained a history, examined the patient, evaluated laboratory and imaging results, formulated the assessment and plan and placed orders.  CRITICAL CARE: The patient is critically ill with multiple organ systems failure and requires high complexity decision making for assessment and support, frequent  evaluation and titration of therapies, application of advanced monitoring technologies and extensive interpretation of multiple databases. Critical Care Time devoted to patient care services described in this note is 35 minutes.   Rush Farmer, M.D. [facility] Pulmonary/Critical Care Medicine. Pager: [phone]. After hours pager: [phone].

## 2013-11-12 NOTE — Progress Notes (Addendum)
Per CT, pt requiring oral contrast as well as IV contrast for CA w/u. Attempted to page Dr. Ellene Route x2 to clarify if pt could have NG placed and receive oral contrast while NPO for surgery. At this time, no call back received. Will await response.   Dr. Ellene Route responded and wished for NG to be placed for oral contrast administration. Will follow through.

## 2013-11-12 NOTE — Anesthesia Procedure Notes (Signed)
Procedure Name: Intubation Date/Time: 11/12/2013 5:45 AM Performed by: Valetta Fuller Pre-anesthesia Checklist: Patient identified, Emergency Drugs available, Suction available and Patient being monitored Patient Re-evaluated:Patient Re-evaluated prior to inductionOxygen Delivery Method: Circle system utilized Preoxygenation: Pre-oxygenation with 100% oxygen Intubation Type: IV induction, Rapid sequence and Cricoid Pressure applied Ventilation: Mask ventilation without difficulty Laryngoscope Size: Miller and 2 Grade View: Grade II Tube type: Subglottic suction tube Tube size: 7.5 mm Number of attempts: 2 (first attempt with Glidescope. Able to visualize posterior cords but unable to pass ETT. DL with Mill 2, Gr 2 view, intubated without trauma ) Airway Equipment and Method: Stylet Placement Confirmation: ETT inserted through vocal cords under direct vision,  positive ETCO2 and breath sounds checked- equal and bilateral Secured at: 23 cm Tube secured with: Tape Dental Injury: Teeth and Oropharynx as per pre-operative assessment

## 2013-11-12 NOTE — Progress Notes (Signed)
UR completed.  Brandan Glauber, RN BSN [facility] CCM Trauma/Neuro ICU Case Manager [phone]  

## 2013-11-12 NOTE — Transfer of Care (Signed)
Immediate Anesthesia Transfer of Care Note  Patient: Katie [name]  Procedure(s) Performed: Procedure(s): RIGHT PARIETAL CRANIOTOMY (Right)  Patient Location: ICU  Anesthesia Type:General  Level of Consciousness: sedated  Airway & Oxygen Therapy: Patient remains intubated per anesthesia plan and Patient placed on Ventilator (see vital sign flow sheet for setting)  Post-op Assessment: Report given to PACU RN and Post -op Vital signs reviewed and stable  Post vital signs: Reviewed and stable  Complications: No apparent anesthesia complications

## 2013-11-12 NOTE — Progress Notes (Addendum)
INITIAL NUTRITION ASSESSMENT  DOCUMENTATION CODES Per approved criteria  -Not Applicable   INTERVENTION: Initiate Pivot 1.5 @ 20 ml/hr via OGT and increase by 10 ml every 4 hours to goal rate of 35 ml/hr. At goal rate, tube feeding regimen will provide 1260 kcal, 78 grams of protein, and 638 ml of H2O. This will meet 95% of estimated calorie needs and 100% of estimated protein needs.  Provide daily liquid multivitamin per tube   NUTRITION DIAGNOSIS: Inadequate oral intake related to inability to eat as evidenced by NPO/Vent status.   Goal: Pt to meet >/= 90% of their estimated nutrition needs   Monitor:  TF initiation/tolerance, vent status, diet advancement/PO intake, weight trends, labs  Reason for Assessment: Vent/ Consult for TF management  63 y.o. female  Admitting Dx: <principal problem not specified>  ASSESSMENT: 63 y.o. F who apparently had a syncopal episode 3/23 falling and hitting her head in the left frontal region. A CT scan of the brain demonstrated at least 2 large right-sided lesions one frontally and one parietal occipitally there is 12 mm of shift with subfalcine herniation. She underwent right parietal occipital craniotomy and resection of brain tumor 3/24. PCCM consulted post op for vent management. Per Chart, the husband reported that pt has been failing in her normal activities of daily living for the last 6 weeks. She has become confused, disoriented, and has had some speech difficulties.  Pt asleep at time of visit. Propofol hanging but not in use. No family present at this time.   Nutrition Focused Physical Exam:  Subcutaneous Fat:  Orbital Region: wnl Upper Arm Region: wnl Thoracic and Lumbar Region: NA  Muscle:  Temple Region: wnl Clavicle Bone Region: mild/moderate wasting Clavicle and Acromion Bone Region: moderate wasting Scapular Bone Region: NA Dorsal Hand: NA Patellar Region: mild wasting Anterior Thigh Region: mild wasting Posterior Calf  Region: NA  Edema: none noted  Height: Ht Readings from Last 1 Encounters:  11/11/13 5\' 7"  (1.702 m)    Weight: Wt Readings from Last 1 Encounters:  11/11/13 118 lb 13.3 oz (53.9 kg)    Ideal Body Weight: 135 lbs  % Ideal Body Weight: 87%  Wt Readings from Last 10 Encounters:  11/11/13 118 lb 13.3 oz (53.9 kg)  11/11/13 118 lb 13.3 oz (53.9 kg)    Usual Body Weight: unknown  % Usual Body Weight: NA  BMI:  Body mass index is 18.61 kg/(m^2).  Patient is currently intubated on ventilator support.  MV: 7.2 L/min Temp (24hrs), Avg:98.3 F (36.8 C), Min:97.5 F (36.4 C), Max:99.1 F (37.3 C)  Propofol: turned off  Estimated Nutritional Needs: Kcal: 1322 Protein: 70-80 grams Fluid: 2 L/day  Skin: closed incision on head, abrasion on left elbow, ecchymosis on left hip  Diet Order: NPO  EDUCATION NEEDS: -No education needs identified at this time   Intake/Output Summary (Last 24 hours) at 11/12/13 1441 Last data filed at 11/12/13 1300  Gross per 24 hour  Intake   3105 ml  Output   2460 ml  Net    645 ml    Last BM: PTA   Labs:   Recent Labs Lab 11/12/13 0055  NA 138  K 4.0  CL 100  CO2 22  BUN 14  CREATININE 0.56  CALCIUM 9.4  MG 1.8  PHOS 3.7  GLUCOSE 138*    CBG (last 3)  No results found for this basename: GLUCAP,  in the last 72 hours  Scheduled Meds: . antiseptic oral  rinse  15 mL Mouth Rinse QID  . chlorhexidine  15 mL Mouth Rinse BID  . dexamethasone  10 mg Intravenous 4 times per day  . famotidine (PEPCID) IV  20 mg Intravenous Q12H  . levETIRAcetam  500 mg Intravenous Q12H  . pantoprazole (PROTONIX) IV  40 mg Intravenous QHS  . propofol        Continuous Infusions: . sodium chloride 75 mL/hr at 11/11/13 2155  . sodium chloride 75 mL/hr at 11/12/13 1300  . propofol      Past Medical History  Diagnosis Date  . Anxiety     takes lexapro    Past Surgical History  Procedure Laterality Date  . Appendectomy    . Colon  surgery      polyps  removed    Pryor Ochoa RD, LDN Inpatient Clinical Dietitian Pager: [phone] After Hours Pager: [phone]

## 2013-11-12 NOTE — Plan of Care (Signed)
Problem: Consults Goal: Craniotomy Patient Education See Patient Education Module for education specifics.  Outcome: Completed/Met Date Met:  11/12/13 Education provided to family. Goal: Diagnosis - Craniotomy Outcome: Completed/Met Date Met:  11/12/13 Brain tumor R parietal, R frontal

## 2013-11-12 NOTE — Op Note (Signed)
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 Surgeon: Kristeen Miss Assistant: Deri Fuelling Anesthesia: Gen. endotracheal Indications: The patient is a 63 year old individual who's had new diagnosis of brain tumor made yesterday. She was undergoing workup to include a CT of the chest abdomen and pelvis what was noted that the patient was developing decorticate posture. She was emergently intubated she is taken to the operating room to undergo surgical decompression of a large right parietal metastatic lesion. On  Procedure: Patient was brought to the operating room supine on a stretcher already intubated. She was placed in a 3 pin head rest and carefully transferred to the operating table where she was placed in the left lateral decubitus position. The head was turned towards the left side to expose the left parietal occipital region. The scalp over this region was shaved. The skin was cleansed with alcohol and then prepped with DuraPrep. A straight incision was made from the region of the mastoid to the vertex of the scalp. The dissection was carried down to the galea. The subperiosteal tissues were dissected free. The posterior portion of the temporalis muscle and fascia was then removed from the bone. A singular burr hole was placed in its anterior most location near the temporalis fascia. A craniotomy flap was then raised measuring 6 cm in maximum diameter. The underlying dura was noted be modestly tense. The patient had been given 50 g of mannitol in addition to being hyperventilated. The dura was then opened in a cruciate fashion. At the inferior aspect of the craniotomy there was noted to be a translucent area of the brain  with some grayish material underneath. A corticectomy was performed here. Brain tissue was evacuated that was clearly devitalized in this region. Meanwhile immediately underneath this a very hemorrhagic abnormal mass of tumor was encountered. Several biopsies of this region were obtained. These were sent for frozen section. Then a general dissection of the mass was performed by using some cottonoid patties to [place] on the surrounding brain from the mass. The mass was gradually mobilized. As the dissection continued in all the quadrants some of the tumor could be debulked from an internal debulking the most of it was then removed as a singular large mass after an adequate circumferential dissection was performed. There was a substantial amount of devitalized brain around the tumor. This was resected. Hemostasis was obtained in a piecemeal fashion using pledgets of Gelfoam soaked in thrombin along with Surgifoam to create and cannot enforce with cottonoids overlying this. This procedure of hemostasis then continued for the next 45 minutes until ultimately a dry field was obtained. The dissection was noted to have gone down to the tentorium, and also abutted the ventricle. The ventricular lining was cauterized carefully Nevin Bloodgood feels surfaces being cauterized to obtain hemostasis.  Pathologic diagnosis from the frozen section was obtained as a malignant carcinoma. Once hemostasis was adequately obtained the dura was Crowell closed in a cruciate fashion with 4-0 Nurolon tack up sutures were placed around the perimetry the bone flap was replaced with 4 straight titanium plates. The galea was closed with 2-0 Vicryl, and surgical staples were used on the scalp. A dry dressing was applied. The patient was returned to the neuro ICU in stable condition intubated. Blood loss was estimated at 750 cc.

## 2013-11-12 NOTE — Anesthesia Preprocedure Evaluation (Signed)
Anesthesia Evaluation  Patient identified by MRN, date of birth, ID band Patient unresponsive    Reviewed: Allergy & Precautions, H&P , NPO status , Patient's Chart, lab work & pertinent test results  Airway Mallampati: I TM Distance: >3 FB Neck ROM: Full    Dental  (+) Teeth Intact   Pulmonary Current Smoker,  breath sounds clear to auscultation        Cardiovascular Rhythm:Regular Rate:Normal     Neuro/Psych    GI/Hepatic   Endo/Other    Renal/GU      Musculoskeletal   Abdominal   Peds  Hematology   Anesthesia Other Findings Unable to interview pt as she had a seizure and was reported posturing in a decorticate manner on my arrival.  Reproductive/Obstetrics                           Anesthesia Physical Anesthesia Plan  ASA: III  Anesthesia Plan: General   Post-op Pain Management:    Induction: Intravenous  Airway Management Planned: Oral ETT  Additional Equipment:   Intra-op Plan:   Post-operative Plan: Possible Post-op intubation/ventilation  Informed Consent: I have reviewed the patients History and Physical, chart, labs and discussed the procedure including the risks, benefits and alternatives for the proposed anesthesia with the patient or authorized representative who has indicated his/her understanding and acceptance.   History available from chart only  Plan Discussed with: CRNA, Anesthesiologist and Surgeon  Anesthesia Plan Comments:         Anesthesia Quick Evaluation

## 2013-11-12 NOTE — Anesthesia Postprocedure Evaluation (Signed)
  Anesthesia Post-op Note  Patient: Katie [name]  Procedure(s) Performed: Procedure(s): RIGHT PARIETAL CRANIOTOMY (Right)  Patient Location: PACU  Anesthesia Type:General  Level of Consciousness: sedated and Patient remains intubated per anesthesia plan  Airway and Oxygen Therapy: Patient remains intubated per anesthesia plan  Post-op Pain: none  Post-op Assessment: Post-op Vital signs reviewed, Patient's Cardiovascular Status Stable, Respiratory Function Stable, Patent Airway, No signs of Nausea or vomiting and Pain level controlled  Post-op Vital Signs: Reviewed and stable  Complications: No apparent anesthesia complications

## 2013-11-12 NOTE — Progress Notes (Signed)
Dr. Ellene Route notififed of patient neuro change. Pupils now 3 and hippus/non-reactive. Pt not moving all extremities purposefully at this time. Decorticate posturing to UE. No movement to painful stimulus on LE. Instructed to call anesthesia for intubation. Anesthesia at bedside at this time.

## 2013-11-13 ENCOUNTER — Inpatient Hospital Stay (HOSPITAL_COMMUNITY): Payer: BC Managed Care – PPO

## 2013-11-13 LAB — POCT I-STAT 3, ART BLOOD GAS (G3+)
ACID-BASE EXCESS: 2 mmol/L (ref 0.0–2.0)
BICARBONATE: 25.9 meq/L — AB (ref 20.0–24.0)
O2 Saturation: 100 %
TCO2: 27 mmol/L (ref 0–100)
pCO2 arterial: 39.7 mmHg (ref 35.0–45.0)
pH, Arterial: 7.425 (ref 7.350–7.450)
pO2, Arterial: 298 mmHg — ABNORMAL HIGH (ref 80.0–100.0)

## 2013-11-13 LAB — CBC
HCT: 31.2 % — ABNORMAL LOW (ref 36.0–46.0)
Hemoglobin: 10.7 g/dL — ABNORMAL LOW (ref 12.0–15.0)
MCH: 32.3 pg (ref 26.0–34.0)
MCHC: 34.3 g/dL (ref 30.0–36.0)
MCV: 94.3 fL (ref 78.0–100.0)
PLATELETS: 178 10*3/uL (ref 150–400)
RBC: 3.31 MIL/uL — AB (ref 3.87–5.11)
RDW: 13 % (ref 11.5–15.5)
WBC: 11.1 10*3/uL — AB (ref 4.0–10.5)

## 2013-11-13 LAB — GLUCOSE, CAPILLARY
GLUCOSE-CAPILLARY: 158 mg/dL — AB (ref 70–99)
GLUCOSE-CAPILLARY: 167 mg/dL — AB (ref 70–99)
GLUCOSE-CAPILLARY: 180 mg/dL — AB (ref 70–99)
Glucose-Capillary: 181 mg/dL — ABNORMAL HIGH (ref 70–99)
Glucose-Capillary: 125 mg/dL — ABNORMAL HIGH (ref 70–99)
Glucose-Capillary: 158 mg/dL — ABNORMAL HIGH (ref 70–99)

## 2013-11-13 LAB — BASIC METABOLIC PANEL
BUN: 31 mg/dL — ABNORMAL HIGH (ref 6–23)
CALCIUM: 8.5 mg/dL (ref 8.4–10.5)
CO2: 24 mEq/L (ref 19–32)
CREATININE: 0.64 mg/dL (ref 0.50–1.10)
Chloride: 108 mEq/L (ref 96–112)
GFR calc non Af Amer: >90 mL/min (ref >90)
Glucose, Bld: 157 mg/dL — ABNORMAL HIGH (ref 70–99)
Potassium: 4.3 mEq/L (ref 3.7–5.3)
SODIUM: 143 meq/L (ref 137–147)

## 2013-11-13 LAB — MAGNESIUM: Magnesium: 2.1 mg/dL (ref 1.5–2.5)

## 2013-11-13 LAB — PHOSPHORUS: PHOSPHORUS: 3.4 mg/dL (ref 2.3–4.6)

## 2013-11-13 MED ORDER — ACETAMINOPHEN 160 MG/5ML PO SOLN
650.0000 mg | Freq: Four times a day (QID) | ORAL | Status: DC | PRN
  Administered 2013-11-13 – 2013-12-01 (×7): 650 mg
  Filled 2013-11-13 (×7): qty 20.3

## 2013-11-13 MED ORDER — INSULIN ASPART 100 UNIT/ML ~~LOC~~ SOLN
0.0000 [IU] | SUBCUTANEOUS | Status: DC
  Administered 2013-11-13 (×2): 3 [IU] via SUBCUTANEOUS
  Administered 2013-11-13: 2 [IU] via SUBCUTANEOUS
  Administered 2013-11-13 – 2013-11-14 (×3): 3 [IU] via SUBCUTANEOUS
  Administered 2013-11-14 (×3): 2 [IU] via SUBCUTANEOUS
  Administered 2013-11-14: 3 [IU] via SUBCUTANEOUS
  Administered 2013-11-15 – 2013-11-16 (×8): 2 [IU] via SUBCUTANEOUS
  Administered 2013-11-16: 3 [IU] via SUBCUTANEOUS
  Administered 2013-11-16: 2 [IU] via SUBCUTANEOUS
  Administered 2013-11-16 (×2): 3 [IU] via SUBCUTANEOUS
  Administered 2013-11-17: 2 [IU] via SUBCUTANEOUS
  Administered 2013-11-17: 3 [IU] via SUBCUTANEOUS
  Administered 2013-11-17: 2 [IU] via SUBCUTANEOUS
  Administered 2013-11-17: 3 [IU] via SUBCUTANEOUS
  Administered 2013-11-17 – 2013-11-18 (×4): 2 [IU] via SUBCUTANEOUS
  Administered 2013-11-18: 3 [IU] via SUBCUTANEOUS
  Administered 2013-11-18 – 2013-11-19 (×5): 2 [IU] via SUBCUTANEOUS
  Administered 2013-11-19 – 2013-11-20 (×4): 3 [IU] via SUBCUTANEOUS
  Administered 2013-11-20 (×3): 2 [IU] via SUBCUTANEOUS
  Administered 2013-11-20: 3 [IU] via SUBCUTANEOUS
  Administered 2013-11-20: 2 [IU] via SUBCUTANEOUS
  Administered 2013-11-21: 3 [IU] via SUBCUTANEOUS
  Administered 2013-11-21 (×4): 2 [IU] via SUBCUTANEOUS
  Administered 2013-11-21: 3 [IU] via SUBCUTANEOUS
  Administered 2013-11-22: 2 [IU] via SUBCUTANEOUS
  Administered 2013-11-22: 3 [IU] via SUBCUTANEOUS
  Administered 2013-11-22 – 2013-11-23 (×2): 2 [IU] via SUBCUTANEOUS
  Administered 2013-11-23: 20:00:00 via SUBCUTANEOUS
  Administered 2013-11-23 – 2013-11-24 (×6): 2 [IU] via SUBCUTANEOUS
  Administered 2013-11-24: 3 [IU] via SUBCUTANEOUS
  Administered 2013-11-24 – 2013-11-25 (×4): 2 [IU] via SUBCUTANEOUS
  Administered 2013-11-25: 3 [IU] via SUBCUTANEOUS
  Administered 2013-11-25 – 2013-11-26 (×2): 2 [IU] via SUBCUTANEOUS
  Administered 2013-11-26: 3 [IU] via SUBCUTANEOUS
  Administered 2013-11-26 (×3): 2 [IU] via SUBCUTANEOUS
  Administered 2013-11-27: 3 [IU] via SUBCUTANEOUS
  Administered 2013-11-27 – 2013-11-28 (×4): 2 [IU] via SUBCUTANEOUS
  Administered 2013-11-28: 3 [IU] via SUBCUTANEOUS
  Administered 2013-11-29 (×2): 2 [IU] via SUBCUTANEOUS
  Administered 2013-11-29: 3 [IU] via SUBCUTANEOUS
  Administered 2013-11-30 – 2013-12-01 (×6): 2 [IU] via SUBCUTANEOUS
  Administered 2013-12-02: 3 [IU] via SUBCUTANEOUS
  Administered 2013-12-02 (×3): 2 [IU] via SUBCUTANEOUS
  Administered 2013-12-02: 3 [IU] via SUBCUTANEOUS
  Administered 2013-12-03: 2 [IU] via SUBCUTANEOUS
  Administered 2013-12-03: 3 [IU] via SUBCUTANEOUS
  Administered 2013-12-03 (×3): 2 [IU] via SUBCUTANEOUS
  Administered 2013-12-04 (×4): 3 [IU] via SUBCUTANEOUS
  Administered 2013-12-04 – 2013-12-05 (×2): 2 [IU] via SUBCUTANEOUS
  Administered 2013-12-05: 3 [IU] via SUBCUTANEOUS
  Administered 2013-12-05: 2 [IU] via SUBCUTANEOUS
  Administered 2013-12-06: 3 [IU] via SUBCUTANEOUS
  Administered 2013-12-06: 2 [IU] via SUBCUTANEOUS

## 2013-11-13 NOTE — Progress Notes (Signed)
Subjective: Patient reports Remains intubated. Drainage has been noted from dressing. Patient appears more spontaneous in her localization.  Objective: Vital signs in last 24 hours: Temp:  [97.1 F (36.2 C)-101.2 F (38.4 C)] 97.1 F (36.2 C) (03/25 0805) Pulse Rate:  [69-107] 82 (03/25 1100) Resp:  [13-20] 18 (03/25 1100) BP: (102-140)/(56-78) 140/76 mmHg (03/25 1100) SpO2:  [95 %-100 %] 99 % (03/25 1100) Arterial Line BP: (98-158)/(44-66) 158/66 mmHg (03/25 1100) FiO2 (%):  [40 %] 40 % (03/25 0805) Weight:  [57.2 kg (126 lb 1.7 oz)] 57.2 kg (126 lb 1.7 oz) (03/25 0335)  Intake/Output from previous day: 03/24 0701 - 03/25 0700 In: 4241 [I.V.:3508; NG/GT:423; IV Piggyback:310] Out: 2620 [Urine:1520; Blood:1100] Intake/Output this shift: Total I/O In: 1868.4 [I.V.:1818.4; IV Piggyback:50] Out: 160 [Urine:160]  Localizes briskly to voice or mild pain. Does not follow commands.  Lab Results:  Recent Labs  11/12/13 0913 11/13/13 0500  WBC  --  11.1*  HGB 9.2* 10.7*  HCT 27.0* 31.2*  PLT  --  178   BMET  Recent Labs  11/12/13 0055  11/12/13 0913 11/13/13 0500  NA 138  < > 139 143  K 4.0  < > 3.3* 4.3  CL 100  --   --  108  CO2 22  --   --  24  GLUCOSE 138*  --   --  157*  BUN 14  --   --  31*  CREATININE 0.56  --   --  0.64  CALCIUM 9.4  --   --  8.5  < > = values in this interval not displayed.  Studies/Results: Ct Head Wo Contrast  11/13/2013   CLINICAL DATA:  Follow-up brain tumor resection.  EXAM: CT HEAD WITHOUT CONTRAST  TECHNIQUE: Contiguous axial images were obtained from the base of the skull through the vertex without intravenous contrast.  COMPARISON:  MR HEAD WO/W CM dated 11/11/2013; CT HEAD W/O CM dated 11/11/2013  FINDINGS: Status post interval right frontal parietal craniotomy for debulking/ resection of right temporoparietal mass. Small amount of hyperdense blood products and air within the resection cavity. Small amount of extra-axial  pneumocephalus mass seen.  No apparent change in mildly hyperdense right frontal lobe 22 x 19 mm mass, with a subcentimeter focus along the inferior margin of hyperdense possible blood products, axial 15/33. Right cerebral probable vasogenic edema, corresponding to MRI abnormality. 10 mm right to left midline shift, previously 12 mm. The right lateral ventricle remains predominantly the face, however the left ventricle system is decompressed, no entrapment on today's examination. Patchy hypodensity within the left mesial parietal lobe, axial 20/ 33 and evolving the gray and white matter.  Slight re-expansion of supercerebellar cistern, with persistent effacement of the quadrigeminal/ambient cistern. Very mild right uncal herniation is similar.  Mild calcific atherosclerosis of the carotid siphons. Visualized paranasal sinuses remain well aerated. Included ocular globes and orbital contents are nonsuspicious.  IMPRESSION: Status post interval right frontal parietal craniotomy for debulking/ resection of known right temporal parietal mass, with expected postoperative changes including minimal air and blood within the resection cavity. Small amount of extra-axial pneumocephalus. Overall less mass effect with residual 10 mm right to left midline shift (improved), without hydrocephalus or ventricular entrapment on today's study.  Focal loss of the gray-white matter junction involving the mesial left parietal lobe concerning for acute ischemia.  Stable appearance of 22 x 19 mm right frontal lobe mass, however there is a subcentimeter focus of apparent blood along the inferior  margin of the mass which appears new.   Electronically Signed   By: Elon Alas   On: 11/13/2013 06:33   Mr Brain W Wo Contrast  11/12/2013   CLINICAL DATA:  Syncopal episode, intracranial masses.  EXAM: MRI HEAD WITHOUT AND WITH CONTRAST  TECHNIQUE: Multiplanar, multiecho pulse sequences of the brain and surrounding structures were obtained  without and with intravenous contrast for surgical planning.  CONTRAST:  98mL MULTIHANCE GADOBENATE DIMEGLUMINE 529 MG/ML IV SOLN  COMPARISON:  CT HEAD W/O CM dated 11/11/2013; CT HEAD W/O CM dated 12/15/2009  FINDINGS: Moderately motion degraded examination, per technologist patient was unable to remain still for the examination.  Heterogeneous may enhancing right temporal parietal occipital at least 4.4 x 6 cm (transverse by AP) mass with surrounding T2 FLAIR hyperintense vasogenic edema and possibly nonenhancing tumor. T2 bright signal along the temporal stem extending into the internal capsule, external capsule on the right. Effacement of the right temporal horn, occipital horn with left lateral ventricle/atrial enlargement concerning for entrapment with FLAIR hyperintense probable transependymal flow cerebral spinal fluid. 10 mm of right-to-left midline shift.  Similar characteristic 2.3 x 2.3 cm mass in high right frontal lobe, the gray-white matter junction. Extensor surrounding FLAIR hyperintense signal.  Additional scattered subcentimeter supratentorial white matter lesions, with no definite enhancement. No extra-axial masses no abnormal leptomeningeal enhancement. Effacement of the right quadrigeminal/ambient cistern deforming the mid brain. Mildly effaced suprasellar cistern.  IMPRESSION: Moderately motion degraded limited MRI of the brain for surgical planning.  Heterogeneously enhancing 4.4 x 6 cm right temporal parietal occipital mass with extensive surrounding presumed vasogenic edema, though a component infiltrated nonenhancing tumor not excluded. In addition, similar appearing high right frontal lobe 2.3 x 2.3. Constellation of findings likely reflect metastatic disease, less likely lymphoma or possibly primary brain tumors.  10 mm of right-to-left midline shift with left ventricle entrapment/hydrocephalus. Effaced basal cisterns.  Additional subcentimeter white matter lesions favor chronic small  vessel ischemic disease, incompletely characterized on this motion degraded limited MRI.   Electronically Signed   By: Elon Alas   On: 11/12/2013 00:32   Dg Chest Port 1 View  11/13/2013   CLINICAL DATA:  Evaluate endotracheal tube position.  EXAM: PORTABLE CHEST - 1 VIEW  COMPARISON:  DG CHEST 1V PORT dated 11/12/2013; DG CHEST 1V PORT dated 11/11/2013; DG CHEST 2V dated 12/15/2009  FINDINGS: Endotracheal tube is roughly 3.1 cm above the carina. Slightly increased densities in the retrocardiac space probably represent atelectasis or vascular crowding. Again noted is underlying hyperinflation. No evidence for a pneumothorax. Stable appearance of the heart and mediastinum. Heart size is normal. Nasogastric tube extends into the abdomen.  IMPRESSION: Endotracheal tube is appropriately positioned.  Questionable retrocardiac density could represent focal atelectasis. Recommend attention on follow-up imaging.   Electronically Signed   By: Markus Daft M.D.   On: 11/13/2013 08:09   Dg Chest Port 1 View  11/12/2013   CLINICAL DATA:  Metastatic brain tumor.  Craniotomy yesterday.  EXAM: PORTABLE CHEST - 1 VIEW  COMPARISON:  11/11/2013  FINDINGS: Endotracheal tube and the NG tube appear in good position.  Lungs appear clear.  No effusions.  No acute osseous abnormality.  IMPRESSION: Clear lungs.   Electronically Signed   By: Rozetta Nunnery M.D.   On: 11/12/2013 10:22   Dg Abd Portable 1v  11/12/2013   CLINICAL DATA:  Nasogastric tube placement.  EXAM: PORTABLE ABDOMEN - 1 VIEW  COMPARISON:  No priors.  FINDINGS: Single view  of the abdomen demonstrates a nasogastric tube with tip in the body of the stomach and side port near the gastroesophageal junction. Visualized bowel gas pattern is nonobstructive.  IMPRESSION: 1. Tip of nasogastric tube is in the body of the stomach.   Electronically Signed   By: Vinnie Langton M.D.   On: 11/12/2013 05:05    Assessment/Plan: Dressing changed and more staples applied to  areas of sanguinous fluid from incision.  LOS: 2 days  Continue to observe clinical status. Followup CT scan shows less shift and mass effect than preoperatively. No significant bleeding in tumor bed   Katlen Seyer J 11/13/2013, 12:25 PM

## 2013-11-13 NOTE — Progress Notes (Signed)
Chaplain met with pt family pertaining to consult.  Chaplain spent time talking with family and determining the level of pastoral care needed.  Family seemed to be appropriately grieving and realistic about pt expectations.  Chaplain assessment is that there is suitable family support and interpersonal comfort being provided between family members.  Chaplain ended visit prematurely due to code blue but it appears that pt family is not in need of spiritual support at this time.  However, the need may arise in the coming days and spiritual care will remain on stand-by if that were to happen.

## 2013-11-13 NOTE — Progress Notes (Signed)
[facility] Progress Note Patient Name: Katie [name] DOB: [birth year] MRN: [id number]  Date of Service  11/13/2013   HPI/Events of Note  Hyperglycemia on seroids   eICU Interventions  Placed on q4 hour SSI coverage - moderate scale   Intervention Category Intermediate Interventions: Hyperglycemia - evaluation and treatment  [place] 11/13/2013, 3:55 AM

## 2013-11-13 NOTE — Consult Note (Addendum)
PULMONARY / CRITICAL CARE MEDICINE   Name: Katie [name] MRN: [id number] DOB: 11-18-1950    ADMISSION DATE:  11/11/2013 CONSULTATION DATE:  11/12/2013  REFERRING MD :  Ellene Route PRIMARY SERVICE: Neurosurgery  CHIEF COMPLAINT:  Vent Management s/p craniotomy and resection of brain tumor.  BRIEF PATIENT DESCRIPTION: 63 y.o. F who underwent right parietal occipital craniotomy and resection of brain tumor 3/24.  PCCM consulted post op for vent management.  SIGNIFICANT EVENTS / STUDIES:  3/23 >>> admitted after syncopal episode, CT head demonstrated 2 large R sided lesions with subfalcine herniation. 3/24 >>> to OR for right parietal occipital craniotomy and resection of brain tumor, back to Neuro ICU on vent. 3/25>>>remains vented  LINES / TUBES: OETT 3/24 >>> R radial a.line 3/24 >>> OGT 3/24 >>> Foley 3/24 >>>  CULTURES: None  ANTIBIOTICS: None  SUBJECTIVE:  Remains vented  VITAL SIGNS: Temp:  [97.1 F (36.2 C)-101.2 F (38.4 C)] 97.1 F (36.2 C) (03/25 0805) Pulse Rate:  [63-107] 72 (03/25 0805) Resp:  [11-20] 19 (03/25 0805) BP: (83-131)/(42-78) 131/56 mmHg (03/25 0805) SpO2:  [95 %-100 %] 98 % (03/25 0805) Arterial Line BP: (94-156)/(35-58) 127/51 mmHg (03/25 0700) FiO2 (%):  [40 %-100 %] 40 % (03/25 0805) Weight:  [57.2 kg (126 lb 1.7 oz)] 57.2 kg (126 lb 1.7 oz) (03/25 0335) HEMODYNAMICS:   VENTILATOR SETTINGS: Vent Mode:  [-] CPAP;PSV FiO2 (%):  [40 %-100 %] 40 % Set Rate:  [14 bmp-15 bmp] 14 bmp Vt Set:  [470 mL-500 mL] 500 mL PEEP:  [5 cmH20] 5 cmH20 Pressure Support:  [5 cmH20] 5 cmH20 Plateau Pressure:  [9 cmH20-21 cmH20] 10 cmH20 INTAKE / OUTPUT: Intake/Output     03/24 0701 - 03/25 0700 03/25 0701 - 03/26 0700   I.V. (mL/kg) 3508 (61.3)    NG/GT 423    IV Piggyback 310    Total Intake(mL/kg) 4241 (74.1)    Urine (mL/kg/hr) 1520 (1.1) 60 (0.6)   Blood 1100 (0.8)    Total Output 2620 60   Net +1621 -60          PHYSICAL EXAMINATION: General:   Acutely ill appearing female, sedated post op Neuro:  Sedated, moving all ext, rass 1 HEENT:  Wound oozing Cardiovascular:  RRR, Nl S1/S2 Lungs:  Coarse Abdomen:  Soft, NT, ND and +BS. Musculoskeletal:  -edema and -tenderness. Skin:  Intact, wound dressed.  LABS:  CBC  Recent Labs Lab 11/12/13 0759 11/12/13 0913 11/13/13 0500  WBC  --   --  11.1*  HGB 11.6* 9.2* 10.7*  HCT 34.0* 27.0* 31.2*  PLT  --   --  178   Coag's No results found for this basename: APTT, INR,  in the last 168 hours BMET  Recent Labs Lab 11/12/13 0055  11/12/13 0759 11/12/13 0913 11/13/13 0500  NA 138  < > 134* 139 143  K 4.0  < > 3.4* 3.3* 4.3  CL 100  --   --   --  108  CO2 22  --   --   --  24  BUN 14  --   --   --  31*  CREATININE 0.56  --   --   --  0.64  GLUCOSE 138*  --   --   --  157*  < > = values in this interval not displayed. Electrolytes  Recent Labs Lab 11/12/13 0055 11/13/13 0500  CALCIUM 9.4 8.5  MG 1.8 2.1  PHOS 3.7 3.4   Sepsis Markers  No results found for this basename: LATICACIDVEN, PROCALCITON, O2SATVEN,  in the last 168 hours ABG  Recent Labs Lab 11/12/13 0949 11/12/13 1427 11/13/13 0458  PHART 7.349* 7.403 7.425  PCO2ART 44.0 37.8 39.7  PO2ART 367.0* 117.0* 298.0*   Liver Enzymes  Recent Labs Lab 11/12/13 0055  AST 39*  ALT 25  ALKPHOS 29*  BILITOT 1.3*  ALBUMIN 3.7   Cardiac Enzymes No results found for this basename: TROPONINI, PROBNP,  in the last 168 hours Glucose  Recent Labs Lab 11/12/13 1639 11/12/13 2022 11/13/13 0001 11/13/13 0349  GLUCAP 132* 159* 180* 181*    Imaging Ct Head Wo Contrast  11/13/2013   CLINICAL DATA:  Follow-up brain tumor resection.  EXAM: CT HEAD WITHOUT CONTRAST  TECHNIQUE: Contiguous axial images were obtained from the base of the skull through the vertex without intravenous contrast.  COMPARISON:  MR HEAD WO/W CM dated 11/11/2013; CT HEAD W/O CM dated 11/11/2013  FINDINGS: Status post interval right  frontal parietal craniotomy for debulking/ resection of right temporoparietal mass. Small amount of hyperdense blood products and air within the resection cavity. Small amount of extra-axial pneumocephalus mass seen.  No apparent change in mildly hyperdense right frontal lobe 22 x 19 mm mass, with a subcentimeter focus along the inferior margin of hyperdense possible blood products, axial 15/33. Right cerebral probable vasogenic edema, corresponding to MRI abnormality. 10 mm right to left midline shift, previously 12 mm. The right lateral ventricle remains predominantly the face, however the left ventricle system is decompressed, no entrapment on today's examination. Patchy hypodensity within the left mesial parietal lobe, axial 20/ 33 and evolving the gray and white matter.  Slight re-expansion of supercerebellar cistern, with persistent effacement of the quadrigeminal/ambient cistern. Very mild right uncal herniation is similar.  Mild calcific atherosclerosis of the carotid siphons. Visualized paranasal sinuses remain well aerated. Included ocular globes and orbital contents are nonsuspicious.  IMPRESSION: Status post interval right frontal parietal craniotomy for debulking/ resection of known right temporal parietal mass, with expected postoperative changes including minimal air and blood within the resection cavity. Small amount of extra-axial pneumocephalus. Overall less mass effect with residual 10 mm right to left midline shift (improved), without hydrocephalus or ventricular entrapment on today's study.  Focal loss of the gray-white matter junction involving the mesial left parietal lobe concerning for acute ischemia.  Stable appearance of 22 x 19 mm right frontal lobe mass, however there is a subcentimeter focus of apparent blood along the inferior margin of the mass which appears new.   Electronically Signed   By: Elon Alas   On: 11/13/2013 06:33   Mr Brain W Wo Contrast  11/12/2013   CLINICAL  DATA:  Syncopal episode, intracranial masses.  EXAM: MRI HEAD WITHOUT AND WITH CONTRAST  TECHNIQUE: Multiplanar, multiecho pulse sequences of the brain and surrounding structures were obtained without and with intravenous contrast for surgical planning.  CONTRAST:  38mL MULTIHANCE GADOBENATE DIMEGLUMINE 529 MG/ML IV SOLN  COMPARISON:  CT HEAD W/O CM dated 11/11/2013; CT HEAD W/O CM dated 12/15/2009  FINDINGS: Moderately motion degraded examination, per technologist patient was unable to remain still for the examination.  Heterogeneous may enhancing right temporal parietal occipital at least 4.4 x 6 cm (transverse by AP) mass with surrounding T2 FLAIR hyperintense vasogenic edema and possibly nonenhancing tumor. T2 bright signal along the temporal stem extending into the internal capsule, external capsule on the right. Effacement of the right temporal horn, occipital horn with left lateral ventricle/atrial enlargement  concerning for entrapment with FLAIR hyperintense probable transependymal flow cerebral spinal fluid. 10 mm of right-to-left midline shift.  Similar characteristic 2.3 x 2.3 cm mass in high right frontal lobe, the gray-white matter junction. Extensor surrounding FLAIR hyperintense signal.  Additional scattered subcentimeter supratentorial white matter lesions, with no definite enhancement. No extra-axial masses no abnormal leptomeningeal enhancement. Effacement of the right quadrigeminal/ambient cistern deforming the mid brain. Mildly effaced suprasellar cistern.  IMPRESSION: Moderately motion degraded limited MRI of the brain for surgical planning.  Heterogeneously enhancing 4.4 x 6 cm right temporal parietal occipital mass with extensive surrounding presumed vasogenic edema, though a component infiltrated nonenhancing tumor not excluded. In addition, similar appearing high right frontal lobe 2.3 x 2.3. Constellation of findings likely reflect metastatic disease, less likely lymphoma or possibly primary  brain tumors.  10 mm of right-to-left midline shift with left ventricle entrapment/hydrocephalus. Effaced basal cisterns.  Additional subcentimeter white matter lesions favor chronic small vessel ischemic disease, incompletely characterized on this motion degraded limited MRI.   Electronically Signed   By: Elon Alas   On: 11/12/2013 00:32   Dg Chest Port 1 View  11/13/2013   CLINICAL DATA:  Evaluate endotracheal tube position.  EXAM: PORTABLE CHEST - 1 VIEW  COMPARISON:  DG CHEST 1V PORT dated 11/12/2013; DG CHEST 1V PORT dated 11/11/2013; DG CHEST 2V dated 12/15/2009  FINDINGS: Endotracheal tube is roughly 3.1 cm above the carina. Slightly increased densities in the retrocardiac space probably represent atelectasis or vascular crowding. Again noted is underlying hyperinflation. No evidence for a pneumothorax. Stable appearance of the heart and mediastinum. Heart size is normal. Nasogastric tube extends into the abdomen.  IMPRESSION: Endotracheal tube is appropriately positioned.  Questionable retrocardiac density could represent focal atelectasis. Recommend attention on follow-up imaging.   Electronically Signed   By: Markus Daft M.D.   On: 11/13/2013 08:09   Dg Chest Port 1 View  11/12/2013   CLINICAL DATA:  Metastatic brain tumor.  Craniotomy yesterday.  EXAM: PORTABLE CHEST - 1 VIEW  COMPARISON:  11/11/2013  FINDINGS: Endotracheal tube and the NG tube appear in good position.  Lungs appear clear.  No effusions.  No acute osseous abnormality.  IMPRESSION: Clear lungs.   Electronically Signed   By: Rozetta Nunnery M.D.   On: 11/12/2013 10:22   Dg Abd Portable 1v  11/12/2013   CLINICAL DATA:  Nasogastric tube placement.  EXAM: PORTABLE ABDOMEN - 1 VIEW  COMPARISON:  No priors.  FINDINGS: Single view of the abdomen demonstrates a nasogastric tube with tip in the body of the stomach and side port near the gastroesophageal junction. Visualized bowel gas pattern is nonobstructive.  IMPRESSION: 1. Tip of  nasogastric tube is in the body of the stomach.   Electronically Signed   By: Vinnie Langton M.D.   On: 11/12/2013 05:05     ASSESSMENT / PLAN:  PULMONARY A: VDRF P:   -ABg reviewed, consider MV reduction -pcxr reviewed, repeat in am  -wean cpap 5 ps 5, goal 1 hr, no extubation with current neuro status -assess neuro strength, when able  CARDIOVASCULAR A:  BP control, ICP elevation presumed P:  - consider MAP 65-105 -low threshold cvp, line placement  RENAL A:   ICP elevation P:   - BMP in AM. - avoid brain edema -may need cvp  GASTROINTESTINAL A:   TF started P:   - SUP: pantoprazole. - TF to goal -follow for BM  HEMATOLOGIC A:  VTE Prophylaxis Brain malignancy P:  -  SCD's. - Hold anticoags until OK with neurosurgery, consider lovnenox in future with cancer - CBC in AM. -consider CT abdo/pelvis, chest -follow path  INFECTIOUS A:   No acute issues P:   - Monitor WBC's/fever curve.  ENDOCRINE A:   At risk for steroid induced hyperglycemia   P:   - ssi  NEUROLOGIC A:   S/p right craniotomy Malignant carcinoma P:   - Per neurosurgery. -propofol , WUA planned -keep map 65-105 -avoid fever -steroids -keppra   I have personally obtained a history, examined the patient, evaluated laboratory and imaging results, formulated the assessment and plan and placed orders.  CRITICAL CARE: The patient is critically ill with multiple organ systems failure and requires high complexity decision making for assessment and support, frequent evaluation and titration of therapies, application of advanced monitoring technologies and extensive interpretation of multiple databases. Critical Care Time devoted to patient care services described in this note is 30 minutes.   Lavon Paganini. Titus Mould, MD, [place] Pgr: [facility]

## 2013-11-14 ENCOUNTER — Inpatient Hospital Stay (HOSPITAL_COMMUNITY): Payer: BC Managed Care – PPO

## 2013-11-14 ENCOUNTER — Encounter (HOSPITAL_COMMUNITY): Payer: Self-pay | Admitting: Neurological Surgery

## 2013-11-14 LAB — COMPREHENSIVE METABOLIC PANEL
ALBUMIN: 2.5 g/dL — AB (ref 3.5–5.2)
ALK PHOS: 20 U/L — AB (ref 39–117)
ALT: 17 U/L (ref 0–35)
AST: 22 U/L (ref 0–37)
BUN: 34 mg/dL — AB (ref 6–23)
CALCIUM: 8.2 mg/dL — AB (ref 8.4–10.5)
CO2: 26 mEq/L (ref 19–32)
CREATININE: 0.48 mg/dL — AB (ref 0.50–1.10)
Chloride: 107 mEq/L (ref 96–112)
GFR calc non Af Amer: >90 mL/min (ref >90)
GLUCOSE: 159 mg/dL — AB (ref 70–99)
Potassium: 4.2 mEq/L (ref 3.7–5.3)
Sodium: 143 mEq/L (ref 137–147)
Total Bilirubin: 0.3 mg/dL (ref 0.3–1.2)
Total Protein: 4.6 g/dL — ABNORMAL LOW (ref 6.0–8.3)

## 2013-11-14 LAB — CBC WITH DIFFERENTIAL/PLATELET
Basophils Absolute: 0 10*3/uL (ref 0.0–0.1)
Basophils Relative: 0 % (ref 0–1)
Eosinophils Absolute: 0 10*3/uL (ref 0.0–0.7)
Eosinophils Relative: 0 % (ref 0–5)
HEMATOCRIT: 30.2 % — AB (ref 36.0–46.0)
HEMOGLOBIN: 10.4 g/dL — AB (ref 12.0–15.0)
LYMPHS ABS: 0.8 10*3/uL (ref 0.7–4.0)
LYMPHS PCT: 6 % — AB (ref 12–46)
MCH: 32.5 pg (ref 26.0–34.0)
MCHC: 34.4 g/dL (ref 30.0–36.0)
MCV: 94.4 fL (ref 78.0–100.0)
MONO ABS: 0.5 10*3/uL (ref 0.1–1.0)
Monocytes Relative: 4 % (ref 3–12)
NEUTROS ABS: 11.3 10*3/uL — AB (ref 1.7–7.7)
Neutrophils Relative %: 90 % — ABNORMAL HIGH (ref 43–77)
Platelets: 181 10*3/uL (ref 150–400)
RBC: 3.2 MIL/uL — AB (ref 3.87–5.11)
RDW: 12.9 % (ref 11.5–15.5)
WBC: 12.6 10*3/uL — AB (ref 4.0–10.5)

## 2013-11-14 LAB — TYPE AND SCREEN
ABO/RH(D): A NEG
ANTIBODY SCREEN: NEGATIVE
Unit division: 0
Unit division: 0

## 2013-11-14 LAB — GLUCOSE, CAPILLARY
GLUCOSE-CAPILLARY: 114 mg/dL — AB (ref 70–99)
GLUCOSE-CAPILLARY: 143 mg/dL — AB (ref 70–99)
GLUCOSE-CAPILLARY: 156 mg/dL — AB (ref 70–99)
GLUCOSE-CAPILLARY: 162 mg/dL — AB (ref 70–99)
Glucose-Capillary: 125 mg/dL — ABNORMAL HIGH (ref 70–99)
Glucose-Capillary: 133 mg/dL — ABNORMAL HIGH (ref 70–99)

## 2013-11-14 LAB — POCT I-STAT GLUCOSE
Glucose, Bld: 148 mg/dL — ABNORMAL HIGH (ref 70–99)
OPERATOR ID: 305741

## 2013-11-14 LAB — POCT I-STAT 3, ART BLOOD GAS (G3+)
Acid-Base Excess: 3 mmol/L — ABNORMAL HIGH (ref 0.0–2.0)
Acid-Base Excess: 4 mmol/L — ABNORMAL HIGH (ref 0.0–2.0)
BICARBONATE: 27.5 meq/L — AB (ref 20.0–24.0)
Bicarbonate: 27.5 mEq/L — ABNORMAL HIGH (ref 20.0–24.0)
O2 Saturation: 98 %
O2 Saturation: 98 %
PCO2 ART: 41.2 mmHg (ref 35.0–45.0)
PCO2 ART: 36.5 mmHg (ref 35.0–45.0)
PH ART: 7.431 (ref 7.350–7.450)
Patient temperature: 97.9
TCO2: 29 mmol/L (ref 0–100)
TCO2: 29 mmol/L (ref 0–100)
pH, Arterial: 7.486 — ABNORMAL HIGH (ref 7.350–7.450)
pO2, Arterial: 93 mmHg (ref 80.0–100.0)
pO2, Arterial: 92 mmHg (ref 80.0–100.0)

## 2013-11-14 LAB — TRIGLYCERIDES: Triglycerides: 70 mg/dL (ref <150)

## 2013-11-14 MED ORDER — IOHEXOL 300 MG/ML  SOLN
100.0000 mL | Freq: Once | INTRAMUSCULAR | Status: AC | PRN
  Administered 2013-11-14: 100 mL via INTRAVENOUS

## 2013-11-14 MED ORDER — IOHEXOL 300 MG/ML  SOLN
25.0000 mL | INTRAMUSCULAR | Status: AC
  Administered 2013-11-14 (×2): 25 mL via ORAL

## 2013-11-14 NOTE — Consult Note (Signed)
PULMONARY / CRITICAL CARE MEDICINE   Name: Katie [name] MRN: [id number] DOB: 04/08/1951    ADMISSION DATE:  11/11/2013 CONSULTATION DATE:  11/12/2013  REFERRING MD :  Ellene Route PRIMARY SERVICE: Neurosurgery  CHIEF COMPLAINT:  Vent Management s/p craniotomy and resection of brain tumor.  BRIEF PATIENT DESCRIPTION: 63 y.o. F who underwent right parietal occipital craniotomy and resection of brain tumor 3/24.  PCCM consulted post op for vent management.  SIGNIFICANT EVENTS / STUDIES:  3/23 >>> admitted after syncopal episode, CT head demonstrated 2 large R sided lesions with subfalcine herniation. 3/24 >>> to OR for right parietal occipital craniotomy and resection of brain tumor, back to Neuro ICU on vent. 3/25>>>remains vented 3/25 ct head>>>Status post interval right frontal parietal craniotomy for<BR>debulking/ resection of known right temporal parietal mass, with<BR>expected postoperative changes including minimal air and blood<BR>within the resection cavity. Small amount of extra-axial<BR>pneumocephalus. Overall less mass effect with residual 10 mm right<BR>to left midline shift (improved), without hydrocephalus or<BR>ventricular entrapment on today's study.<BR> <BR>Focal loss of the gray-white matter junction involving the mesial<BR>left parietal lobe concerning for acute ischemia.<BR> <BR>Stable appearance of 22 x 19 mm right frontal lobe mass, however<BR>there is a subcentimeter focus of apparent blood along the inferior<BR>margin of the mass which appears new.<BR>  LINES / TUBES: OETT 3/24 >>> R radial a.line 3/24 >>> OGT 3/24 >>> Foley 3/24 >>>  CULTURES: None  ANTIBIOTICS: None  SUBJECTIVE: Some HTN  VITAL SIGNS: Temp:  [97.7 F (36.5 C)-98.2 F (36.8 C)] 97.9 F (36.6 C) (03/26 0340) Pulse Rate:  [62-83] 83 (03/26 1000) Resp:  [14-19] 17 (03/26 1000) BP: (101-159)/(54-76) 125/66 mmHg (03/26 1000) SpO2:  [96 %-100 %] 100 % (03/26 1000) Arterial Line BP: (105-194)/(51-78)  183/66 mmHg (03/26 1000) FiO2 (%):  [40 %] 40 % (03/26 0830) Weight:  [56.9 kg (125 lb 7.1 oz)] 56.9 kg (125 lb 7.1 oz) (03/26 0411) HEMODYNAMICS:   VENTILATOR SETTINGS: Vent Mode:  [-] CPAP;PSV FiO2 (%):  [40 %] 40 % Set Rate:  [14 bmp] 14 bmp Vt Set:  [500 mL] 500 mL PEEP:  [5 cmH20] 5 cmH20 Pressure Support:  [5 cmH20-8 cmH20] 8 cmH20 Plateau Pressure:  [6 cmH20-13 cmH20] 6 cmH20 INTAKE / OUTPUT: Intake/Output     03/25 0701 - 03/26 0700 03/26 0701 - 03/27 0700   I.V. (mL/kg) 2443.5 (42.9)    NG/GT 840    IV Piggyback 310    Total Intake(mL/kg) 3593.5 (63.2)    Urine (mL/kg/hr) 770 (0.6) 150 (0.8)   Blood     Total Output 770 150   Net +2823.5 -150          PHYSICAL EXAMINATION: General:  Acutely ill appearing female Neuro:  Sedated, moving all ext equal, does not open eyes HEENT:  Wound clean Cardiovascular:  RRR, Nl S1/S2 Lungs:  CTA Abdomen:  Soft, NT, ND and +BS. Musculoskeletal:  -edema and -tenderness. Skin:  Intact, wound dressed.  LABS:  CBC  Recent Labs Lab 11/12/13 0913 11/13/13 0500 11/14/13 0500  WBC  --  11.1* 12.6*  HGB 9.2* 10.7* 10.4*  HCT 27.0* 31.2* 30.2*  PLT  --  178 181   Coag's No results found for this basename: APTT, INR,  in the last 168 hours BMET  Recent Labs Lab 11/12/13 0055  11/12/13 0913 11/13/13 0500 11/14/13 0500  NA 138  < > 139 143 143  K 4.0  < > 3.3* 4.3 4.2  CL 100  --   --  108 107  CO2  22  --   --  24 26  BUN 14  --   --  31* 34*  CREATININE 0.56  --   --  0.64 0.48*  GLUCOSE 138*  --   --  157* 159*  < > = values in this interval not displayed. Electrolytes  Recent Labs Lab 11/12/13 0055 11/13/13 0500 11/14/13 0500  CALCIUM 9.4 8.5 8.2*  MG 1.8 2.1  --   PHOS 3.7 3.4  --    Sepsis Markers No results found for this basename: LATICACIDVEN, PROCALCITON, O2SATVEN,  in the last 168 hours ABG  Recent Labs Lab 11/12/13 1427 11/13/13 0458 11/14/13 0520  PHART 7.403 7.425 7.431  PCO2ART 37.8  39.7 41.2  PO2ART 117.0* 298.0* 93.0   Liver Enzymes  Recent Labs Lab 11/12/13 0055 11/14/13 0500  AST 39* 22  ALT 25 17  ALKPHOS 29* 20*  BILITOT 1.3* 0.3  ALBUMIN 3.7 2.5*   Cardiac Enzymes No results found for this basename: TROPONINI, PROBNP,  in the last 168 hours Glucose  Recent Labs Lab 11/13/13 1148 11/13/13 1600 11/13/13 2008 11/13/13 2358 11/14/13 0339 11/14/13 0806  GLUCAP 125* 167* 158* 143* 156* 162*    Imaging Ct Head Wo Contrast  11/13/2013   CLINICAL DATA:  Follow-up brain tumor resection.  EXAM: CT HEAD WITHOUT CONTRAST  TECHNIQUE: Contiguous axial images were obtained from the base of the skull through the vertex without intravenous contrast.  COMPARISON:  MR HEAD WO/W CM dated 11/11/2013; CT HEAD W/O CM dated 11/11/2013  FINDINGS: Status post interval right frontal parietal craniotomy for debulking/ resection of right temporoparietal mass. Small amount of hyperdense blood products and air within the resection cavity. Small amount of extra-axial pneumocephalus mass seen.  No apparent change in mildly hyperdense right frontal lobe 22 x 19 mm mass, with a subcentimeter focus along the inferior margin of hyperdense possible blood products, axial 15/33. Right cerebral probable vasogenic edema, corresponding to MRI abnormality. 10 mm right to left midline shift, previously 12 mm. The right lateral ventricle remains predominantly the face, however the left ventricle system is decompressed, no entrapment on today's examination. Patchy hypodensity within the left mesial parietal lobe, axial 20/ 33 and evolving the gray and white matter.  Slight re-expansion of supercerebellar cistern, with persistent effacement of the quadrigeminal/ambient cistern. Very mild right uncal herniation is similar.  Mild calcific atherosclerosis of the carotid siphons. Visualized paranasal sinuses remain well aerated. Included ocular globes and orbital contents are nonsuspicious.  IMPRESSION: Status  post interval right frontal parietal craniotomy for debulking/ resection of known right temporal parietal mass, with expected postoperative changes including minimal air and blood within the resection cavity. Small amount of extra-axial pneumocephalus. Overall less mass effect with residual 10 mm right to left midline shift (improved), without hydrocephalus or ventricular entrapment on today's study.  Focal loss of the gray-white matter junction involving the mesial left parietal lobe concerning for acute ischemia.  Stable appearance of 22 x 19 mm right frontal lobe mass, however there is a subcentimeter focus of apparent blood along the inferior margin of the mass which appears new.   Electronically Signed   By: Elon Alas   On: 11/13/2013 06:33   Dg Chest Port 1 View  11/14/2013   CLINICAL DATA:  Endotracheal tube evaluation  EXAM: PORTABLE CHEST - 1 VIEW  COMPARISON:  DG CHEST 1V PORT dated 11/13/2013  FINDINGS: Hyperaeration. Tubular device is stable. Minimal patchy density at the left base stable. Right lung is  clear. No pneumothorax.  IMPRESSION: Stable minimal patchy density at the left base.   Electronically Signed   By: Maryclare Bean M.D.   On: 11/14/2013 07:52   Dg Chest Port 1 View  11/13/2013   CLINICAL DATA:  Evaluate endotracheal tube position.  EXAM: PORTABLE CHEST - 1 VIEW  COMPARISON:  DG CHEST 1V PORT dated 11/12/2013; DG CHEST 1V PORT dated 11/11/2013; DG CHEST 2V dated 12/15/2009  FINDINGS: Endotracheal tube is roughly 3.1 cm above the carina. Slightly increased densities in the retrocardiac space probably represent atelectasis or vascular crowding. Again noted is underlying hyperinflation. No evidence for a pneumothorax. Stable appearance of the heart and mediastinum. Heart size is normal. Nasogastric tube extends into the abdomen.  IMPRESSION: Endotracheal tube is appropriately positioned.  Questionable retrocardiac density could represent focal atelectasis. Recommend attention on  follow-up imaging.   Electronically Signed   By: Markus Daft M.D.   On: 11/13/2013 08:09     ASSESSMENT / PLAN:  PULMONARY A: VDRF P:   -ABG reviewed, reduce T V450 -wean this am cpap5 ps 5 then to 10 , goal 4-6 hr -assess cough, and anility to protect airway -pcxr in am for ett  CARDIOVASCULAR A:  BP control, ICP elevation presumed P:  - consider MAP 65-105, so far has met mark, some concern A line accuracy, dc -low threshold cvp -avoid such gross pos balance  RENAL A:   ICP elevation, for contrast P:   - BMP in AM. -allow pos balance but less than prior, add maintenance for CT  GASTROINTESTINAL A:   TF tolerated R/o primary cance abdo P:   - SUP: pantoprazole. - TF to goal, holf for CT -for CT  HEMATOLOGIC A:  VTE Prophylaxis Brain malignancy P:  - SCD's. - Hold anticoags until OK with neurosurgery, consider lovnenox in future with cancer - CBC in AM -today CT abdo/pelvis, chest -follow path  INFECTIOUS A:   No acute issues P:   - Monitor WBC's/fever curve.  ENDOCRINE A:   At risk for steroid induced hyperglycemia- controlled P:   - ssi  NEUROLOGIC A:   S/p right craniotomy Malignant carcinoma P:   - Per neurosurgery. -propofol now off, can restart  As needed -keep map 65-105, met on own -avoid fever -steroids -keppra   I have personally obtained a history, examined the patient, evaluated laboratory and imaging results, formulated the assessment and plan and placed orders.  CRITICAL CARE: The patient is critically ill with multiple organ systems failure and requires high complexity decision making for assessment and support, frequent evaluation and titration of therapies, application of advanced monitoring technologies and extensive interpretation of multiple databases. Critical Care Time devoted to patient care services described in this note is 30 minutes.   Lavon Paganini. Titus Mould, MD, [place] Pgr: [facility]

## 2013-11-14 NOTE — Progress Notes (Signed)
Postop day 2. Patient remains elevated on ventilator. Sedation has recently been stopped. Patient hemodynamically stable overnight. No new issues.  Afebrile. Vitals are stable. No awakening to voice or pain. Pupils 1 mm and sluggish bilaterally. Corneal nerve reflexes normal bilaterally. Patient with cough and gag reflex. Purposeful movement in both upper and lower extremities without obvious asymmetry. Dressing clean and dry. Chest and abdomen benign.  Status post craniotomy for metastatic brain tumor. Pathology still pending. Observe today and allow sedation to reverse. Cancer workup pending results of pathology and CT of chest and abdomen when feasible.

## 2013-11-14 NOTE — Clinical Documentation Improvement (Signed)
  [facility] MD's, PA's, and NP's   Possible Clinical Conditions?  Patient's BMI 18.7 per anesthethia report.  If  either the following conditions are appropriate, please document in notes if appropriate.  Thank you   Underweight  Cachetic  Other Condition  Cannot clinically determine   Risk Factors:s/p Craniotomy for newly dx Brain tumor  Thank You, Ree Kida ,RN Clinical Documentation Specialist:  [facility] Information Management

## 2013-11-15 ENCOUNTER — Inpatient Hospital Stay (HOSPITAL_COMMUNITY): Payer: BC Managed Care – PPO

## 2013-11-15 ENCOUNTER — Encounter: Payer: Self-pay | Admitting: Radiation Oncology

## 2013-11-15 ENCOUNTER — Ambulatory Visit
Admit: 2013-11-15 | Discharge: 2013-11-15 | Disposition: A | Discharge status: Home / Self Care | Payer: BC Managed Care – PPO | Attending: Radiation Oncology | Admitting: Radiation Oncology

## 2013-11-15 DIAGNOSIS — C3490 Malignant neoplasm of unspecified part of unspecified bronchus or lung: Secondary | ICD-10-CM

## 2013-11-15 DIAGNOSIS — C7931 Secondary malignant neoplasm of brain: Secondary | ICD-10-CM

## 2013-11-15 DIAGNOSIS — C7949 Secondary malignant neoplasm of other parts of nervous system: Secondary | ICD-10-CM

## 2013-11-15 DIAGNOSIS — C719 Malignant neoplasm of brain, unspecified: Secondary | ICD-10-CM

## 2013-11-15 LAB — CBC WITH DIFFERENTIAL/PLATELET
BASOS PCT: 0 % (ref 0–1)
Basophils Absolute: 0 10*3/uL (ref 0.0–0.1)
EOS ABS: 0 10*3/uL (ref 0.0–0.7)
EOS PCT: 0 % (ref 0–5)
HCT: 31.4 % — ABNORMAL LOW (ref 36.0–46.0)
Hemoglobin: 10.7 g/dL — ABNORMAL LOW (ref 12.0–15.0)
LYMPHS ABS: 0.8 10*3/uL (ref 0.7–4.0)
Lymphocytes Relative: 6 % — ABNORMAL LOW (ref 12–46)
MCH: 32.1 pg (ref 26.0–34.0)
MCHC: 34.1 g/dL (ref 30.0–36.0)
MCV: 94.3 fL (ref 78.0–100.0)
MONOS PCT: 5 % (ref 3–12)
Monocytes Absolute: 0.7 10*3/uL (ref 0.1–1.0)
Neutro Abs: 11.9 10*3/uL — ABNORMAL HIGH (ref 1.7–7.7)
Neutrophils Relative %: 89 % — ABNORMAL HIGH (ref 43–77)
Platelets: 193 10*3/uL (ref 150–400)
RBC: 3.33 MIL/uL — AB (ref 3.87–5.11)
RDW: 12.9 % (ref 11.5–15.5)
WBC: 13.3 10*3/uL — ABNORMAL HIGH (ref 4.0–10.5)

## 2013-11-15 LAB — BASIC METABOLIC PANEL
BUN: 30 mg/dL — AB (ref 6–23)
CALCIUM: 8.4 mg/dL (ref 8.4–10.5)
CO2: 27 mEq/L (ref 19–32)
Chloride: 103 mEq/L (ref 96–112)
Creatinine, Ser: 0.51 mg/dL (ref 0.50–1.10)
GFR calc Af Amer: >90 mL/min (ref >90)
GLUCOSE: 123 mg/dL — AB (ref 70–99)
Potassium: 4.3 mEq/L (ref 3.7–5.3)
Sodium: 142 mEq/L (ref 137–147)

## 2013-11-15 LAB — GLUCOSE, CAPILLARY
Glucose-Capillary: 125 mg/dL — ABNORMAL HIGH (ref 70–99)
Glucose-Capillary: 130 mg/dL — ABNORMAL HIGH (ref 70–99)
Glucose-Capillary: 131 mg/dL — ABNORMAL HIGH (ref 70–99)
Glucose-Capillary: 138 mg/dL — ABNORMAL HIGH (ref 70–99)
Glucose-Capillary: 140 mg/dL — ABNORMAL HIGH (ref 70–99)
Glucose-Capillary: 148 mg/dL — ABNORMAL HIGH (ref 70–99)
Glucose-Capillary: 153 mg/dL — ABNORMAL HIGH (ref 70–99)

## 2013-11-15 MED ORDER — PIVOT 1.5 CAL PO LIQD
1000.0000 mL | ORAL | Status: DC
  Administered 2013-11-15 – 2013-11-17 (×3): 1000 mL
  Filled 2013-11-15 (×5): qty 1000

## 2013-11-15 MED ORDER — PANTOPRAZOLE SODIUM 40 MG PO PACK
40.0000 mg | PACK | Freq: Every day | ORAL | Status: DC
  Administered 2013-11-15: 40 mg
  Filled 2013-11-15 (×3): qty 20

## 2013-11-15 MED ORDER — FAMOTIDINE 40 MG/5ML PO SUSR
20.0000 mg | Freq: Two times a day (BID) | ORAL | Status: DC
  Administered 2013-11-15 – 2013-11-17 (×5): 20 mg
  Filled 2013-11-15 (×8): qty 2.5

## 2013-11-15 MED ORDER — LEVETIRACETAM 100 MG/ML PO SOLN
500.0000 mg | Freq: Two times a day (BID) | ORAL | Status: DC
  Administered 2013-11-15 – 2013-11-22 (×15): 500 mg
  Filled 2013-11-15 (×23): qty 5

## 2013-11-15 NOTE — Addendum Note (Signed)
Addendum created 11/15/13 5027 by Napoleon Form, MD   Modules edited: Anesthesia Attestations

## 2013-11-15 NOTE — Procedures (Signed)
Extubation Procedure Note  Patient Details:   Name: Katie [name] DOB: [birth year] MRN: [id number]   Pt extubated to [facility] after successful SBT      Evaluation  O2 sats: stable throughout Complications: No apparent complications Patient did tolerate procedure well. Bilateral Breath Sounds: Clear;Diminished Suctioning: Airway Yes  [name] 11/15/2013, 12:08 PM

## 2013-11-15 NOTE — Progress Notes (Signed)
Patient remains intubated on the ventilator. No significant issues overnight.  Afebrile. Vital stable. Wound clean and dry. Patient will open eyes to noxious stimuli but not spontaneously. She by report follow commands once this morning but now is just purposeful bilaterally. Chest and abdomen are stable.  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  Status post craniotomy and resection of tumor. 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 Patient should be a radiated is seen this medically feasible.

## 2013-11-15 NOTE — Progress Notes (Signed)
NUTRITION FOLLOW-UP  DOCUMENTATION CODES Per approved criteria  -Not Applicable   INTERVENTION: To better meet nutritional needs, increase Pivot 1.5 to 40 ml/hr. At goal rate, tube feeding regimen will provide 1440 kcal, 90 grams of protein, and 729 ml of H2O. This will meet 102% of estimated calorie needs and 100% of estimated protein needs.  Continue liquid multivitamin per tube daily. RD to continue to follow nutrition care plan.  NUTRITION DIAGNOSIS: Inadequate oral intake related to inability to eat as evidenced by NPO/Vent status.   Goal: Pt to meet >/= 90% of their estimated nutrition needs   Monitor:  TF initiation/tolerance, vent status, diet advancement/PO intake, weight trends, labs  ASSESSMENT: 63 y.o. F who apparently had a syncopal episode 3/23 falling and hitting her head in the left frontal region. A CT scan of the brain demonstrated at least 2 large right-sided lesions one frontally and one parietal occipitally there is 12 mm of shift with subfalcine herniation. She underwent right parietal occipital craniotomy and resection of brain tumor 3/24. PCCM consulted post op for vent management.  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  Patient is currently intubated on ventilator support MV: 9.3 L/min Temp (24hrs), Avg:98.7 F (37.1 C), Min:98.2 F (36.8 C), Max:99.5 F (37.5 C)  Propofol: off  Potassium, magnesium and phosphorus WNL CBG's elevated: 133, 125, 131  Currently receiving Pivot 1.5 at 35 ml/hr. Pt is tolerating well, per RN. Pt has yet to have BM.  Height: Ht Readings from Last 1 Encounters:  11/11/13 5\' 7"  (1.702 m)    Weight: Wt Readings from Last 1 Encounters:  11/15/13 126 lb 15.8 oz (57.6 kg)  Admit wt 118 lb  BMI:  Body mass index is 19.88 kg/(m^2). WNL  Estimated Nutritional Needs: Kcal: 1413 Protein: 90 - 110 grams Fluid: 2 L/day  Skin: closed incision on head, abrasion on left  elbow, ecchymosis on left hip  Diet Order:   NPO    Intake/Output Summary (Last 24 hours) at 11/15/13 0906 Last data filed at 11/15/13 0600  Gross per 24 hour  Intake 2287.86 ml  Output   1575 ml  Net 712.86 ml    Last BM: PTA   Labs:   Recent Labs Lab 11/12/13 0055  11/13/13 0500 11/14/13 0500 11/15/13 0316  NA 138  < > 143 143 142  K 4.0  < > 4.3 4.2 4.3  CL 100  --  108 107 103  CO2 22  --  24 26 27   BUN 14  --  31* 34* 30*  CREATININE 0.56  --  0.64 0.48* 0.51  CALCIUM 9.4  --  8.5 8.2* 8.4  MG 1.8  --  2.1  --   --   PHOS 3.7  --  3.4  --   --   GLUCOSE 138*  < > 157* 159* 123*  < > = values in this interval not displayed.  CBG (last 3)   Recent Labs  11/14/13 2010 11/14/13 2358 11/15/13 0351  GLUCAP 133* 125* 131*    Scheduled Meds: . antiseptic oral rinse  15 mL Mouth Rinse QID  . chlorhexidine  15 mL Mouth Rinse BID  . dexamethasone  10 mg Intravenous 4 times per day  . famotidine (PEPCID) IV  20 mg Intravenous Q12H  . feeding supplement (PIVOT 1.5 CAL)  1,000 mL Per Tube Q24H  . insulin aspart  0-15 Units Subcutaneous 6 times per day  . levETIRAcetam  500 mg Intravenous Q12H  . multivitamin  5 mL Per Tube Daily  . pantoprazole (PROTONIX) IV  40 mg Intravenous QHS    Continuous Infusions: . sodium chloride 50 mL/hr at 11/14/13 2331  . propofol 20 mcg/kg/min (11/15/13 0200)    Inda Coke MS, RD, LDN Inpatient Registered Dietitian Pager: [phone] After-hours pager: [phone]

## 2013-11-15 NOTE — Consult Note (Signed)
[facility]  Telephone:([phone]) [place]                                MR#: [id number]  DOB: 1950/09/26                       CSN#: [id number]  Referring MD: CCM Primary MD: Dr.  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 female transferred from [facility]  on 11/11/13 with syncopal episode with trauma to the frontal portion of the head. CT of the head revealed multiple intracranial masses, largest  In the right posterior parietal region  of 5 cm,and frontally of 2 cm. Occipitally there is 12 mm of shift with subfalcine herniation worrisome for malignancy. There was surrounding vasogenic and cytotoxic edema.    MRI brain demonstrated Heterogeneously enhancing 4.4 x 6 cm right temporal parietal occipital mass with extensive surrounding presumed vasogenic edema .In addition, similar appearing high right frontal lobe 2.3 x 2.3. cm was seen. A10 mm of right-to-left midline shift with left ventricle entrapment/hydrocephalus, and Effaced basal cisterns were noted. She received IV Decadron 10 mg . She underwent right parietal occipital craniotomy and resection of brain tumor on 3/24.   A new, post procedure CT of the head on 3/25 showed Stable appearance of 22 x 19 mm right frontal lobe mass, however there was a subcentimeter focus of apparent blood along the inferior margin of the mass which appeared new.CXR on 3/23  showed  A dense 3 mm left apical nodule.  CT of the chest with contrast on 3/26 revealed Four low-attenuation likely necrotic enlarged subcarinal of lymph nodes  measuring 2.5 cm, 2.6 cm, 2.2 cm and 2.2 cm  No additional thoracic adenopathy. Scattered subcutaneous and soft tissue edema.   Lobulated left lower lobe mass 2.0 x 1.7 cm by3 cm was seen. No pleural effusion, pneumothorax, or additional mass/nodule. No acute osseous findings. CT of the abdomen was only  remarkable for few normal-sized retroperitoneal lymph nodes No mass, adenopathy, or ascites. No acute osseous findings. Path report, case [id number] Dr. 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 Based on these findings we have been asked to see this patient in consultation with recommendations regarding her care.  Specimen Gross and Clinical Information       PMH:  Past Medical History  Diagnosis Date  . Anxiety     takes lexapro    Surgeries:  Past Surgical History  Procedure Laterality Date  . Appendectomy    . Colon surgery      polyps  removed  . Craniotomy Right 11/12/2013    Procedure: RIGHT PARIETAL CRANIOTOMY;  Surgeon: Kristeen Miss, MD;  Location: [facility];  Service: Neurosurgery;  Laterality: Right;    Allergies: No Known Allergies  Medications:   Prior to Admission:  Prescriptions prior to admission  Medication Sig Dispense Refill  . aspirin EC 81 MG tablet Take 81 mg by mouth daily.      Marland Kitchen  atorvastatin (LIPITOR) 10 MG tablet Take 10 mg by mouth every evening.      . cyanocobalamin 500 MCG tablet Take 500 mcg by mouth daily.      Marland Kitchen escitalopram (LEXAPRO) 10 MG tablet Take 10 mg by mouth every morning.        PJA:SNKNLZJQBHALP (TYLENOL) oral liquid 160 mg/5 mL, fentaNYL, labetalol, ondansetron (ZOFRAN) IV, ondansetron (ZOFRAN) IV, ondansetron, ondansetron, promethazine  ROS: Unable to obtain, patient intubated and sedated .  Neurological: Patient had been experiencing falls 6 weeks prior to admission, with intermittent confusion and  disorientation  Family History:    Unable to obtain. Per chart, no pertinent past medical history   Social History:  reports that she has been smoking.  She does not have any smokeless tobacco history on file. Her alcohol and drug histories are not on file. Married. Lives in [place], [place]  Physical Exam    Filed Vitals:   11/15/13 0743  BP: 142/77  Pulse: 66  Temp: 98.2 F (36.8 C)  Resp: 15     Filed Weights   11/13/13 0335 11/14/13 0411 11/15/13 0404  Weight: 126 lb 1.7 oz (57.2 kg) 125 lb 7.1 oz (56.9 kg) 126 lb 15.8 oz (57.6 kg)   General:  59 -year-old white female in no acute distress intubated and sedated   HEENT: Left frontal bruising at the site of the fall, PERRLA. Sclerae anicteric. Oral cavity without thrush or lesions. NECK:supple. no thyromegaly, no cervical or supraclavicular adenopathy  LUNGS:decreased breath sounds anteriorily. No wheezing, rhonchi or rales. No axillary masses. BREASTS: not examined. CARDIOVASCULAR: regular rate and rhythm, no murmur , rubs or gallops ABDOMEN: soft nontender , bowel sounds x4. No hepatosplenomegaly. No masses palpable.  GU/rectal: deferred. EXTREMITIES: no clubbing cyanosis or edema.some echymmoses after fall or petechial rash NEURO: as per NS. Opens and closes eyes, but unable to communicate as patient is intubated and sedated  Labs:  CBC   Recent Labs Lab 11/12/13 0759 11/12/13 0913 11/13/13 0500 11/14/13 0500 11/15/13 0316  WBC  --   --  11.1* 12.6* 13.3*  HGB 11.6* 9.2* 10.7* 10.4* 10.7*  HCT 34.0* 27.0* 31.2* 30.2* 31.4*  PLT  --   --  178 181 193  MCV  --   --  94.3 94.4 94.3  MCH  --   --  32.3 32.5 32.1  MCHC  --   --  34.3 34.4 34.1  RDW  --   --  13.0 12.9 12.9  LYMPHSABS  --   --   --  0.8 0.8  MONOABS  --   --   --  0.5 0.7  EOSABS  --   --   --  0.0 0.0  BASOSABS  --   --   --  0.0 0.0     CMP    Recent Labs Lab 11/12/13 0055  11/12/13 0732 11/12/13 0759 11/12/13 0913 11/13/13 0500  11/14/13 0500 11/15/13 0316  NA 138  < >  --  134* 139 143 143 142  K 4.0  < >  --  3.4* 3.3* 4.3 4.2 4.3  CL 100  --   --   --   --  108 107 103  CO2 22  --   --   --   --  24 26 27   GLUCOSE 138*  --  148*  --   --  157* 159* 123*  BUN 14  --   --   --   --  31* 34* 30*  CREATININE 0.56  --   --   --   --  0.64 0.48* 0.51  CALCIUM 9.4  --   --   --   --  8.5 8.2* 8.4  MG 1.8  --   --   --   --  2.1  --   --   AST 39*  --   --   --   --   --  22  --   ALT 25  --   --   --   --   --  17  --   ALKPHOS 29*  --   --   --   --   --  20*  --   BILITOT 1.3*  --   --   --   --   --  0.3  --   < > = values in this interval not displayed.      Component Value Date/Time   BILITOT 0.3 11/14/2013 0500      Imaging Studies:  Ct Head Wo Contrast  11/13/2013     COMPARISON:  MR HEAD WO/W CM dated 11/11/2013; CT HEAD W/O CM dated 11/11/2013  FINDINGS: Status post interval right frontal parietal craniotomy for debulking/ resection of right temporoparietal mass. Small amount of hyperdense blood products and air within the resection cavity. Small amount of extra-axial pneumocephalus mass seen.  No apparent change in mildly hyperdense right frontal lobe 22 x 19 mm mass, with a subcentimeter focus along the inferior margin of hyperdense possible blood products, axial 15/33. Right cerebral probable vasogenic edema, corresponding to MRI abnormality. 10 mm right to left midline shift, previously 12 mm. The right lateral ventricle remains predominantly the face, however the left ventricle system is decompressed, no entrapment on today's examination. Patchy hypodensity within the left mesial parietal lobe, axial 20/ 33 and evolving the gray and white matter.  Slight re-expansion of supercerebellar cistern, with persistent effacement of the quadrigeminal/ambient cistern. Very mild right uncal herniation is similar.  Mild calcific atherosclerosis of the carotid siphons. Visualized paranasal sinuses remain well aerated.  Included ocular globes and orbital contents are nonsuspicious.  IMPRESSION: Status post interval right frontal parietal craniotomy for debulking/ resection of known right temporal parietal mass, with expected postoperative changes including minimal air and blood within the resection cavity. Small amount of extra-axial pneumocephalus. Overall less mass effect with residual 10 mm right to left midline shift (improved), without hydrocephalus or ventricular entrapment on today's study.  Focal loss of the gray-white matter junction involving the mesial left parietal lobe concerning for acute ischemia.  Stable appearance of 22 x 19 mm right frontal lobe mass, however there is a subcentimeter focus of apparent blood along the inferior margin of the mass which appears new.   Electronically Signed   By: Elon Alas   On: 11/13/2013 06:33   Ct Chest W Contrast COMPARISON:  None  FINDINGS: CT CHEST FINDINGS  Tip of endotracheal tube 3.3 cm above carinal.  Nasogastric tube extends into stomach.  Scattered aortic and coronary arterial calcifications.  Four low-attenuation likely necrotic enlarged subcarinal of lymph nodes identified, measuring 2.5 cm, 2.6 cm, 2.2 cm and 2.2 cm in short axes.  No additional thoracic adenopathy.  Scattered subcutaneous and soft tissue edema.  Vascular structures grossly patent on nondedicated exam.  Lobulated left lower lobe mass 2.0 x 1.7 cm image 42, approximately 3 cm length.  Mild atelectasis in medial left lower lobe.  Remaining lungs clear.  No pleural effusion, pneumothorax, or additional mass/nodule.  No acute osseous findings.  CT ABDOMEN AND PELVIS FINDINGS  Liver, spleen, pancreas, kidneys, and adrenal glands normal appearance.  Distended gallbladder, otherwise unremarkable.  Stomach decompressed with suboptimal assessment of gastric wall thickness.  Normal appendix.  Air and Foley catheter within urinary bladder.  Small amount of nonspecific presacral soft tissue edema.  Bowel  loops otherwise unremarkable.  Few normal-sized retroperitoneal lymph nodes seen.  Scattered atherosclerotic calcification.  No mass, adenopathy, or ascites.  Unremarkable uterus and adnexae.  No acute osseous findings.  IMPRESSION: Lobulated left lower lobe mass 2.0 x 1.7 cm, approximately 3 cm length, consistent with tumor.  Subcarinal adenopathy, low attenuation question necrotic, largest node 2.6 cm short axis.  No intra-abdominal or intrapelvic metastatic disease identified.  Subsegmental atelectasis left lower lobe.   Electronically Signed   By: Lavonia Dana M.D.   On: 11/14/2013 17:00   Mr Jeri Cos BH Contrast  11/12/2013  COMPARISON:  CT HEAD W/O CM dated 11/11/2013; CT HEAD W/O CM dated 12/15/2009  FINDINGS: Moderately motion degraded examination, per technologist patient was unable to remain still for the examination.  Heterogeneous may enhancing right temporal parietal occipital at least 4.4 x 6 cm (transverse by AP) mass with surrounding T2 FLAIR hyperintense vasogenic edema and possibly nonenhancing tumor. T2 bright signal along the temporal stem extending into the internal capsule, external capsule on the right. Effacement of the right temporal horn, occipital horn with left lateral ventricle/atrial enlargement concerning for entrapment with FLAIR hyperintense probable transependymal flow cerebral spinal fluid. 10 mm of right-to-left midline shift.  Similar characteristic 2.3 x 2.3 cm mass in high right frontal lobe, the gray-white matter junction. Extensor surrounding FLAIR hyperintense signal.  Additional scattered subcentimeter supratentorial white matter lesions, with no definite enhancement. No extra-axial masses no abnormal leptomeningeal enhancement. Effacement of the right quadrigeminal/ambient cistern deforming the mid brain. Mildly effaced suprasellar cistern.  IMPRESSION: Moderately motion degraded limited MRI of the brain for surgical planning.  Heterogeneously enhancing 4.4 x 6 cm right  temporal parietal occipital mass with extensive surrounding presumed vasogenic edema, though a component infiltrated nonenhancing tumor not excluded. In addition, similar appearing high right frontal lobe 2.3 x 2.3. Constellation of findings likely reflect metastatic disease, less likely lymphoma or possibly primary brain tumors.  10 mm of right-to-left midline shift with left ventricle entrapment/hydrocephalus. Effaced basal cisterns.  Additional subcentimeter white matter lesions favor chronic small vessel ischemic disease, incompletely characterized on this motion degraded limited MRI.   Electronically Signed   By: Elon Alas   On: 11/12/2013 00:32   Ct Abdomen Pelvis W Contrast  11/14/2013    COMPARISON:  None  FINDINGS: CT CHEST FINDINGS  Tip of endotracheal tube 3.3 cm above carinal.  Nasogastric tube extends into stomach.  Scattered aortic and coronary arterial calcifications.  Four low-attenuation likely necrotic enlarged subcarinal of lymph nodes identified, measuring 2.5 cm, 2.6 cm, 2.2 cm and 2.2 cm in short axes.  No additional thoracic adenopathy.  Scattered subcutaneous and soft tissue edema.  Vascular structures grossly patent on nondedicated exam.  Lobulated left lower lobe mass 2.0 x 1.7 cm image 42, approximately 3 cm length.  Mild atelectasis in medial left lower lobe.  Remaining lungs clear.  No pleural effusion, pneumothorax, or additional mass/nodule.  No acute osseous findings.  CT ABDOMEN AND PELVIS FINDINGS  Liver, spleen, pancreas, kidneys, and adrenal glands normal appearance.  Distended gallbladder, otherwise unremarkable.  Stomach decompressed with suboptimal assessment of  gastric wall thickness.  Normal appendix.  Air and Foley catheter within urinary bladder.  Small amount of nonspecific presacral soft tissue edema.  Bowel loops otherwise unremarkable.  Few normal-sized retroperitoneal lymph nodes seen.  Scattered atherosclerotic calcification.  No mass, adenopathy, or  ascites.  Unremarkable uterus and adnexae.  No acute osseous findings.  IMPRESSION: Lobulated left lower lobe mass 2.0 x 1.7 cm, approximately 3 cm length, consistent with tumor.  Subcarinal adenopathy, low attenuation question necrotic, largest node 2.6 cm short axis.  No intra-abdominal or intrapelvic metastatic disease identified.  Subsegmental atelectasis left lower lobe.   Electronically Signed   By: Lavonia Dana M.D.   On: 11/14/2013 17:00   Dg Chest Port 1 View  COMPARISON:  CT CHEST W/CM dated 11/14/2013; DG CHEST 1V PORT dated 11/14/2013  FINDINGS: Endotracheal tube and NG tube in stable position. Left lower lobe infiltrate noted consistent with pneumonia. No pleural effusion or pneumothorax. Heart size and pulmonary vascularity normal.  IMPRESSION: 1. Endotracheal tube and NG tube in good anatomic position. 2. Left lower lobe infiltrate consistent with pneumonia.   Electronically Signed   By: Marcello Moores  Register   On: 11/15/2013 08:04   Diagnosis 1. Brain, for tumor resection, Right parietal - HIGH GRADE POORLY DIFFERENTIATED NEUROENDOCRINE CARCINOMA, SMALL CELL TYPE, SEE COMMENT. 2. Brain, for tumor resection, Right parietal - HIGH GRADE POORLY DIFFERENTIATED NEUROENDOCRINE CARCINOMA, SMALL CELL TYPE, SEE COMMENT. Microscopic Comment 1. and 2. In both parts 1 and 2, there is diffuse involvement by malignant basaloid neoplasm demonstrating relatively uniform cells with hyperchromatic nuclei, nuclear molding, abundant apoptotic tumor cells, and abundant mitotic figures (including atypical forms). The tumor demonstrates the following immunophenotype: GFAP - negative expression. Cytokeratin AE1/3 - patchy moderate strong expression. CD56 - strong diffuse expression. Chromogranin - focal weak expression. TTF-1 - diffuse moderate to strong expression. Overall, the morphology and immunophenotype are that of metastatic high grade poorly differentiated neuroendocrine carcinoma, small cell type,  primary to lung. The case was reviewed with Dr. Lyndon Code who concurs. The case was discussed with Dr. Clarice Pole nurse on 11/14/2013 (CRR:gt, 11/13/13) Mali RUND DO Pathologist, Electronic Signature (Case signed 11/14/2013) Intraoperative Diagnosis RAPID INTRAOPERATIVE CONSULTATION, FROZEN SECTION, BRAIN TUMOR RIGHT PARIETAL: MALIGNANT. FAVOR CARCINOMA. (JBK)  A/P: 63 y.o. 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 S/p right parietal occipital craniotomy and resection of brain tumor on 3/24. We were kindly requested to evaluate the patient. Radiation Oncology to consult Dr. Marin Olp    is to see the patient following this consult with recommendations regarding diagnosis, treatment options and further workup studies. An addendum to this note is to be written.  Thank you for the referral.  Rondel Jumbo, PA-C 11/15/2013 8:41 AM  ADDENDUM:  I saw and examined Katie [name]. She, unfortunately, was not really aware of what was going on. She was recently extubated. She had her brain surgery for resection of a large right parietal lobe mass. She was bordering on brain herniation. This was found to be small cell lung cancer.  On her body scans, and she has a left lower lobe nodule and significant mediastinal adenopathy. This is all very consistent with small cell lung cancer.  She is to be a fairly heavy smoker.  Is put her family. I show the family and the MRI and CT scan.  Katie [name] has been seen by radiation oncology.  She has been relatively asymptomatic with respect to her pulmonary disease. As such, I think that initiating radiation therapy  for that  brain would be reasonable. I would think that this did start next week.  I'm sure that she has microscopic systemic disease. As such, chemotherapy would be appropriate.  Prior to her illness, she was very active and very healthy. As such, we can be aggressive. They we can utilize carboplatin/VP-16. I would think that she  should have a good response rate with her systemic disease.  It is likely that she will be moved over to [facility] for radiation therapy.  This somewhat interesting that we don't see any obvious systemic disease, outside of that decision with the brain.  We will follow her along closely.  She will need a Port-A-Cath. We can get this placed as an inpatient.  I suspect she may need some physical therapy as she improves.  She does have a feeding tube in place. This, is very important.  I answered all the questions from her family. I told him that this disease can be treated but cannot be cured as as are the spread to the brain, and again, she likely has micrometastatic disease elsewhere.  Katie [name] family has a very strong faith and we shared Scripture.  Laurey Arrow E  Romans 5:3-5

## 2013-11-15 NOTE — Progress Notes (Signed)
PULMONARY / CRITICAL CARE MEDICINE   Name: Katie [name] MRN: [id number] DOB: 1951-04-20    ADMISSION DATE:  11/11/2013 CONSULTATION DATE:  11/12/2013  REFERRING MD :  Ellene Route PRIMARY SERVICE: Neurosurgery  CHIEF COMPLAINT:  Vent Management s/p craniotomy and resection of brain tumor.  BRIEF PATIENT DESCRIPTION: 63 y.o. F who underwent right parietal occipital craniotomy and resection of brain tumor 3/24.  PCCM consulted post op for vent management.  SIGNIFICANT EVENTS / STUDIES:  3/23 >>> admitted after syncopal episode, CT head demonstrated 2 large R sided lesions with subfalcine herniation. 3/24 >>> to OR for right parietal occipital craniotomy and resection of brain tumor, back to Neuro ICU on vent. 3/25>>>remains vented 3/25 ct head>>>Status post interval right frontal parietal craniotomy for<BR>debulking/ resection of known right temporal parietal mass, with<BR>expected postoperative changes including minimal air and blood<BR>within the resection cavity. Small amount of extra-axial<BR>pneumocephalus. Overall less mass effect with residual 10 mm right<BR>to left midline shift (improved), without hydrocephalus or<BR>ventricular entrapment on today's study.<BR> <BR>Focal loss of the gray-white matter junction involving the mesial<BR>left parietal lobe concerning for acute ischemia.<BR> <BR>Stable appearance of 22 x 19 mm right frontal lobe mass, however<BR>there is a subcentimeter focus of apparent blood along the inferior<BR>margin of the mass which appears new. 3/26 ct chest>>>Lobulated left lower lobe mass 2.0 x 1.7 cm, approximately 3 cm length, consistent with tumor. Subcarinal adenopathy, low attenuation question necrotic, largest node 2.6 cm short axis.  3/26 ct abdo>>>No intra-abdominal or intrapelvic metastatic disease identified  LINES / TUBES: OETT 3/24 >>> R radial a.line 3/24 >>> OGT 3/24 >>> Foley 3/24 >>>  CULTURES: None  PATH: Brain bx>>>HIGH GRADE POORLY DIFFERENTIATED  NEUROENDOCRINE CARCINOMA, SMALL CELL TYPE  ANTIBIOTICS: None  SUBJECTIVE: CT chest mass  VITAL SIGNS: Temp:  [98.2 F (36.8 C)-99.5 F (37.5 C)] 98.2 F (36.8 C) (03/27 0743) Pulse Rate:  [61-96] 66 (03/27 0743) Resp:  [12-19] 15 (03/27 0743) BP: (112-169)/(58-96) 142/77 mmHg (03/27 0743) SpO2:  [97 %-100 %] 98 % (03/27 0743) Arterial Line BP: (158-183)/(59-75) 181/75 mmHg (03/26 1300) FiO2 (%):  [40 %] 40 % (03/27 0743) Weight:  [57.6 kg (126 lb 15.8 oz)] 57.6 kg (126 lb 15.8 oz) (03/27 0404) HEMODYNAMICS:   VENTILATOR SETTINGS: Vent Mode:  [-] PSV;CPAP FiO2 (%):  [40 %] 40 % Set Rate:  [14 bmp] 14 bmp Vt Set:  [420 mL-500 mL] 450 mL PEEP:  [5 cmH20] 5 cmH20 Pressure Support:  [5 cmH20-8 cmH20] 5 cmH20 Plateau Pressure:  [6 cmH20-11 cmH20] 11 cmH20 INTAKE / OUTPUT: Intake/Output     03/26 0701 - 03/27 0700 03/27 0701 - 03/28 0700   I.V. (mL/kg) 1137.9 (19.8)    NG/GT 840    IV Piggyback 310    Total Intake(mL/kg) 2287.9 (39.7)    Urine (mL/kg/hr) 1575 (1.1)    Total Output 1575     Net +712.9            PHYSICAL EXAMINATION: General:  Acutely ill appearing female Neuro: followed commands, wiggled toes with WUA HEENT:  Wound clean Cardiovascular:  RRR, Nl S1/S2 Lungs:  CTA Abdomen:  Soft, NT, ND and +BS. Musculoskeletal:  -edema and -tenderness. Skin:  Intact, wound dressed.  LABS:  CBC  Recent Labs Lab 11/13/13 0500 11/14/13 0500 11/15/13 0316  WBC 11.1* 12.6* 13.3*  HGB 10.7* 10.4* 10.7*  HCT 31.2* 30.2* 31.4*  PLT 178 181 193   Coag's No results found for this basename: APTT, INR,  in the last 168 hours BMET  Recent  Labs Lab 11/13/13 0500 11/14/13 0500 11/15/13 0316  NA 143 143 142  K 4.3 4.2 4.3  CL 108 107 103  CO2 _0 BUN 31* 34* 30*  CREATININE 0.64 0.48* 0.51  GLUCOSE 157* 159* 123*   Electrolytes  Recent Labs Lab 11/12/13 0055 11/13/13 0500 11/14/13 0500 11/15/13 0316  CALCIUM 9.4 8.5 8.2* 8.4  MG 1.8 2.1  --    --   PHOS 3.7 3.4  --   --    Sepsis Markers No results found for this basename: LATICACIDVEN, PROCALCITON, O2SATVEN,  in the last 168 hours ABG  Recent Labs Lab 11/13/13 0458 11/14/13 0520 11/14/13 1235  PHART 7.425 7.431 7.486*  PCO2ART 39.7 41.2 36.5  PO2ART 298.0* 93.0 92.0   Liver Enzymes  Recent Labs Lab 11/12/13 0055 11/14/13 0500  AST 39* 22  ALT 25 17  ALKPHOS 29* 20*  BILITOT 1.3* 0.3  ALBUMIN 3.7 2.5*   Cardiac Enzymes No results found for this basename: TROPONINI, PROBNP,  in the last 168 hours Glucose  Recent Labs Lab 11/14/13 0806 11/14/13 1213 11/14/13 1556 11/14/13 2010 11/14/13 2358 11/15/13 0351  GLUCAP 162* 114* 125* 133* 125* 131*    Imaging Ct Chest W Contrast  11/14/2013   CLINICAL DATA:  Brain tumor post excision, 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 Sagittal and coronal MPR images reconstructed from axial data set.  CONTRAST:  124m OMNIPAQUE IOHEXOL 300 MG/ML  SOLN  COMPARISON:  None  FINDINGS: CT CHEST FINDINGS  Tip of endotracheal tube 3.3 cm above carinal.  Nasogastric tube extends into stomach.  Scattered aortic and coronary arterial calcifications.  Four low-attenuation likely necrotic enlarged subcarinal of lymph nodes identified, measuring 2.5 cm, 2.6 cm, 2.2 cm and 2.2 cm in short axes.  No additional thoracic adenopathy.  Scattered subcutaneous and soft tissue edema.  Vascular structures grossly patent on nondedicated exam.  Lobulated left lower lobe mass 2.0 x 1.7 cm image 42, approximately 3 cm length.  Mild atelectasis in medial left lower lobe.  Remaining lungs clear.  No pleural effusion, pneumothorax, or additional mass/nodule.  No acute  osseous findings.  CT ABDOMEN AND PELVIS FINDINGS  Liver, spleen, pancreas, kidneys, and adrenal glands normal appearance.  Distended gallbladder, otherwise unremarkable.  Stomach decompressed with suboptimal assessment of gastric wall thickness.  Normal appendix.  Air and Foley catheter within urinary bladder.  Small amount of nonspecific presacral soft tissue edema.  Bowel loops otherwise unremarkable.  Few normal-sized retroperitoneal lymph nodes seen.  Scattered atherosclerotic calcification.  No mass, adenopathy, or ascites.  Unremarkable uterus and adnexae.  No acute osseous findings.  IMPRESSION: Lobulated left lower lobe mass 2.0 x 1.7 cm, approximately 3 cm length, consistent with tumor.  Subcarinal adenopathy, low attenuation question necrotic, largest node 2.6 cm short axis.  No intra-abdominal or intrapelvic metastatic disease identified.  Subsegmental atelectasis left lower lobe.   Electronically Signed   By: MLavonia DanaM.D.   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 Sagittal and coronal MPR images reconstructed from axial data set.  CONTRAST:  152m OMNIPAQUE IOHEXOL 300 MG/ML  SOLN  COMPARISON:  None  FINDINGS: CT CHEST FINDINGS  Tip of endotracheal tube 3.3 cm above carinal.  Nasogastric tube extends into stomach.  Scattered aortic and coronary arterial calcifications.  Four low-attenuation likely necrotic enlarged subcarinal of lymph nodes identified, measuring 2.5 cm, 2.6 cm, 2.2 cm and 2.2 cm in short axes.  No additional thoracic adenopathy.  Scattered subcutaneous and soft tissue edema.  Vascular  structures grossly patent on nondedicated exam.  Lobulated left lower lobe mass 2.0 x 1.7 cm image 42, approximately 3 cm length.  Mild atelectasis in medial left lower lobe.  Remaining lungs clear.  No pleural effusion, pneumothorax, or additional mass/nodule.  No acute osseous findings.  CT ABDOMEN AND PELVIS FINDINGS  Liver, spleen, pancreas, kidneys, and adrenal glands normal appearance.  Distended gallbladder, otherwise unremarkable.  Stomach decompressed with suboptimal assessment of gastric wall thickness.  Normal appendix.  Air and Foley catheter within urinary bladder.  Small amount of nonspecific presacral soft tissue edema.  Bowel loops otherwise unremarkable.  Few normal-sized retroperitoneal lymph nodes seen.  Scattered atherosclerotic calcification.  No mass, adenopathy, or ascites.  Unremarkable uterus and adnexae.  No acute osseous findings.  IMPRESSION: Lobulated left lower lobe mass 2.0 x 1.7 cm, approximately 3 cm length, consistent with tumor.  Subcarinal adenopathy, low attenuation question necrotic, largest node 2.6 cm short axis.  No intra-abdominal or intrapelvic metastatic disease identified.  Subsegmental atelectasis left lower lobe.   Electronically Signed   By: MLavonia DanaM.D.   On: 11/14/2013 17:00   Dg Chest Port 1 View  11/14/2013   CLINICAL DATA:  Endotracheal tube evaluation  EXAM: PORTABLE CHEST - 1 VIEW  COMPARISON:  DG CHEST 1V PORT dated 11/13/2013  FINDINGS: Hyperaeration. Tubular device is stable. Minimal patchy density at the left base stable. Right lung is clear. No pneumothorax.  IMPRESSION: Stable minimal patchy density at the left base.   Electronically Signed   By: AMaryclare BeanM.D.   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 -allow pos balance today as post contrast , then dc in am   GASTROINTESTINAL A:   TF tolerated R/o primary cance abdo P:   - SUP: pantoprazole. - TF restart -CT reviewed  HEMATOLOGIC A:  VTE Prophylaxis Brain mets, small cell , 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 - Hold anticoags further -I have contacted oncology who will see the pt today and consider rads / chemo  - CBC in AM -today CT abdo/pelvis, chest -follow path  INFECTIOUS A:   At risk LLL atx / obstruction P:   - Monitor WBC's/fever curve.  ENDOCRINE A:   At risk for steroid induced hyperglycemia- controlled P:   - ssi  NEUROLOGIC A:  S/p right craniotomy Malignant carcinoma P:   - Per neurosurgery. -propofol WUA -keep map 65-105 -avoid fever -steroids, per NS, consider reduction -may need rads soon, called -keppra   I have personally obtained a history, examined the patient, evaluated laboratory and imaging results, formulated the assessment and plan and placed orders.  CRITICAL CARE: The patient is critically ill with multiple organ systems failure and requires high complexity decision making for assessment and support, frequent evaluation and titration of therapies, application of advanced monitoring technologies and extensive interpretation of multiple databases. Critical Care Time devoted to patient care services described in this note is 30 minutes.   Lavon Paganini. Titus Mould, MD, [place] Pgr: [facility]

## 2013-11-15 NOTE — Progress Notes (Signed)
Radiation Oncology         [phone] ________________________________  Initial inpatient Consultation  Name: Katie [name] MRN: [id number]  Date: 11/11/2013  DOB: [birth year]  UX:LKGMW, DAVID, MD     REFERRING PHYSICIAN: Dr. 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 female who is still quite sedated at the time of consultation but her family members, including her husband, provide a history. Her husband reports that she had been falling at home and was demonstrating slowing of movements and symptoms that make him concern she was depressed. She was seen by her primary Dr. and her gait was not stable when he tested heal to toe walking. Her B12 was also low. She was referred to a neurologist but had not yet seen her neurologist when she had a syncopal episode at home and trauma to her head. She was initially seen in [facility] but transferred to [facility] on 11/11/2013.  CT of the head revealed multiple intracranial masses  This was followed by MRI of the brain which shows heterogeneously enhancing 4.4 x 6 cm right temporal parietal occipital mass with surrounding vasogenic edema as well as a high right frontal lobe 2.3 x 2.3. cm mass. 10 mm of right-to-left midline shift with left ventricle entrapment/hydrocephalus were appreciated.  She underwent right parietal occipital craniotomy and resection of "a singular large mass " on 3/24. 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   Postoperative CT of the head on 3/25 demonstrates persistent right frontal lobe mass with new subcentimeter focus of blood at the inferior margin and postoperative changes in the right temporal parietal region. CT scans of the chest abdomen and pelvis were performed on 11/14/2013. This demonstrates 4 likely necrotic enlarged lymph nodes in the subcarinal  region as well as a lobulated left lower lobe lung mass. No evidence of osseous metastases or abdominal or pelvic metastases.  She remains in Neuro ICU but was successfully extubated today. Medical Oncology and neurosurgery wished for radiation oncology to see the patient to determine plans of care. She lives in [place], [place]. Multiple family members are present today and they report that she is becoming more responsive.  She is on IV decadron 10mg  QID.  PREVIOUS RADIATION THERAPY: No  PAST MEDICAL HISTORY:  has a past medical history of Anxiety.    PAST SURGICAL HISTORY: Past Surgical History  Procedure Laterality Date  . Appendectomy    . Colon surgery      polyps  removed  . Craniotomy Right 11/12/2013    Procedure: RIGHT PARIETAL CRANIOTOMY;  Surgeon: Kristeen Miss, MD;  Location: [facility];  Service: Neurosurgery;  Laterality: Right;    FAMILY HISTORY: family history is not on file.  SOCIAL HISTORY:  reports that she has been smoking.  She does not have any smokeless tobacco history on file.  ALLERGIES: Review of patient's allergies indicates no known allergies.  MEDICATIONS:  Current Facility-Administered Medications  Medication Dose Route Frequency Provider Last Rate Last Dose  . 0.9 %  sodium chloride infusion   Intravenous Continuous Raylene Miyamoto, MD 50 mL/hr at 11/14/13 2331    . acetaminophen (TYLENOL) solution 650 mg  650 mg Per Tube Q6H PRN Colbert Coyer, MD   650 mg at 11/13/13 0108  . antiseptic oral rinse (BIOTENE) solution 15 mL  15 mL Mouth Rinse QID Rush Farmer,  MD   15 mL at 11/15/13 1200  . chlorhexidine (PERIDEX) 0.12 % solution 15 mL  15 mL Mouth Rinse BID Rush Farmer, MD   15 mL at 11/15/13 0804  . dexamethasone (DECADRON) injection 10 mg  10 mg Intravenous 4 times per day Kristeen Miss, MD   10 mg at 11/15/13 1243  . famotidine (PEPCID) 40 MG/5ML suspension 20 mg  20 mg Per Tube BID Kristeen Miss, MD   20 mg at 11/15/13 1239  .  feeding supplement (PIVOT 1.5 CAL) liquid 1,000 mL  1,000 mL Per Tube Q24H Erlene Quan, RD 40 mL/hr at 11/15/13 1300 1,000 mL at 11/15/13 1300  . insulin aspart (novoLOG) injection 0-15 Units  0-15 Units Subcutaneous 6 times per day Colbert Coyer, MD   2 Units at 11/15/13 1238  . labetalol (NORMODYNE,TRANDATE) injection 10-40 mg  10-40 mg Intravenous Q10 min PRN Kristeen Miss, MD      . levETIRAcetam (KEPPRA) 100 MG/ML solution 500 mg  500 mg Per Tube BID Kristeen Miss, MD   500 mg at 11/15/13 1358  . multivitamin liquid 5 mL  5 mL Per Tube Daily Baird Lyons, RD   5 mL at 11/15/13 1238  . ondansetron (ZOFRAN) tablet 4 mg  4 mg Oral Q6H PRN Kristeen Miss, MD       Or  . ondansetron [facility]) injection 4 mg  4 mg Intravenous Q6H PRN Kristeen Miss, MD      . ondansetron [facility]) tablet 4 mg  4 mg Oral Q4H PRN Kristeen Miss, MD       Or  . ondansetron [facility]) injection 4 mg  4 mg Intravenous Q4H PRN Kristeen Miss, MD      . pantoprazole sodium (PROTONIX) 40 mg/20 mL oral suspension 40 mg  40 mg Per Tube Daily Kristeen Miss, MD   40 mg at 11/15/13 1238  . promethazine (PHENERGAN) tablet 12.5-25 mg  12.5-25 mg Oral Q4H PRN Kristeen Miss, MD        REVIEW OF SYSTEMS:  Notable for that above.   PHYSICAL EXAM:  height is 5\' 7"  (1.702 m) and weight is 126 lb 15.8 oz (57.6 kg). Her axillary temperature is 99.2 F (37.3 C). Her blood pressure is 155/70 and her pulse is 86. Her respiration is 15 and oxygen saturation is 97%.   General: lying in bed in ICU, extubated, responsive at times to voice of family members but not following my commands.  Swinging legs over bedside, moving arms. HEENT: bandage over craniotomy incision: Right parieto occipital region Heart: Regular in rate and rhythm with no murmurs, rubs, or gallops. Chest: Clear to auscultation bilaterally, with no rhonchi, wheezes, or rales. Extremities: moving all extremities. Neurologic: Eyelids closed. Responsive at times to voice of  family members but not following my commands.  Swinging legs over bedside, moving arms.     ECOG = 4 (but due to acute postoperative state, I estimate she had an ECOG of 1 before diagnosis).  0 - Asymptomatic (Fully active, able to carry on all predisease activities without restriction)  1 - Symptomatic but completely ambulatory (Restricted in physically strenuous activity but ambulatory and able to carry out work of a light or sedentary nature. For example, light housework, office work)  2 - Symptomatic, <50% in bed during the day (Ambulatory and capable of all self care but unable to carry out any work activities. Up and about more than 50% of waking hours)  3 - Symptomatic, >  50% in bed, but not bedbound (Capable of only limited self-care, confined to bed or chair 50% or more of waking hours)  4 - Bedbound (Completely disabled. Cannot carry on any self-care. Totally confined to bed or chair)  5 - Death   Eustace Pen MM, Creech RH, Tormey DC, et al. [phone]). "Toxicity and response criteria of the [facility]". [place]col. 5 (6): 649-55   LABORATORY DATA:  Lab Results  Component Value Date   WBC 13.3* 11/15/2013   HGB 10.7* 11/15/2013   HCT 31.4* 11/15/2013   MCV 94.3 11/15/2013   PLT 193 11/15/2013   CMP     Component Value Date/Time   NA 142 11/15/2013 0316   K 4.3 11/15/2013 0316   CL 103 11/15/2013 0316   CO2 27 11/15/2013 0316   GLUCOSE 123* 11/15/2013 0316   BUN 30* 11/15/2013 0316   CREATININE 0.51 11/15/2013 0316   CALCIUM 8.4 11/15/2013 0316   PROT 4.6* 11/14/2013 0500   ALBUMIN 2.5* 11/14/2013 0500   AST 22 11/14/2013 0500   ALT 17 11/14/2013 0500   ALKPHOS 20* 11/14/2013 0500   BILITOT 0.3 11/14/2013 0500   GFRNONAA >90 11/15/2013 0316   GFRAA >90 11/15/2013 0316      PATHOLOGY: REPORT OF SURGICAL PATHOLOGY FINAL DIAGNOSIS Diagnosis 1. Brain, for tumor resection, Right parietal - HIGH GRADE POORLY DIFFERENTIATED NEUROENDOCRINE CARCINOMA, SMALL CELL  TYPE, SEE COMMENT. 2. Brain, for tumor resection, Right parietal - HIGH GRADE POORLY DIFFERENTIATED NEUROENDOCRINE CARCINOMA, SMALL CELL TYPE, SEE COMMENT. Microscopic Comment 1. and 2. In both parts 1 and 2, there is diffuse involvement by malignant basaloid neoplasm demonstrating relatively uniform cells with hyperchromatic nuclei, nuclear molding, abundant apoptotic tumor cells, and abundant mitotic figures (including atypical forms). The tumor demonstrates the following immunophenotype: GFAP - negative expression. Cytokeratin AE1/3 - patchy moderate strong expression. CD56 - strong diffuse expression. Chromogranin - focal weak expression. TTF-1 - diffuse moderate to strong expression. Overall, the morphology and immunophenotype are that of metastatic high grade poorly differentiated neuroendocrine carcinoma, small cell type, primary to lung.    RADIOGRAPHY: I reviewed the imaging described above     IMPRESSION/PLAN: This is a 40 old woman with newly diagnosed small cell lung cancer, with brain metastases. No other sites of systemic disease beyond the chest appreciated based on CT imaging. She was in reasonably good health and according to her family before diagnosis.  She appears to be recovering from surgery and was extubated today.  I would recommend whole brain radiation before starting chemotherapy as it does not appear that the disease in her chest is causing any impending issues. I do not think it is emergent to start whole brain radiation right away and it's reasonable to allow her some further time to heal from surgery.  I spoke with the patient's family about the option of receiving treatments at [facility] versus here in [place] at the [facility] at [facility]. If she has favorable recovery over the next few days, radiation could be considered next week. If they wish to receive radiation here in [place], it may be possibly started as an  inpatient after transfer  to [facility], and then continued as an outpatient after discharge. I would anticipate 2-3 weeks of whole brain radiotherapy.  I had a lengthy discussion with the patient's family and spouse.  We spoke about the risks benefits and side effects of whole brain radiotherapy to address her  brain metastases.  During part of our discussion, we spoke about the fatigue, skin irritation, and hair loss that can occur acutely as well as late cognitive side effects that can result from whole brain radiotherapy - this is usually notable in terms of short term memory and speed of thinking, but rarely a frank dementia.  In case treatment is given at [facility], consent form was signed today.  We will reassess how she is doing on Monday, 3-30, and I will figure out a disposition with the patient's husband and inpatient team.    __________________________________________   Eppie Gibson, MD

## 2013-11-16 ENCOUNTER — Inpatient Hospital Stay (HOSPITAL_COMMUNITY): Payer: BC Managed Care – PPO

## 2013-11-16 LAB — BASIC METABOLIC PANEL
BUN: 26 mg/dL — AB (ref 6–23)
CHLORIDE: 100 meq/L (ref 96–112)
CO2: 28 mEq/L (ref 19–32)
Calcium: 8.6 mg/dL (ref 8.4–10.5)
Creatinine, Ser: 0.48 mg/dL — ABNORMAL LOW (ref 0.50–1.10)
GFR calc Af Amer: >90 mL/min (ref >90)
GFR calc non Af Amer: >90 mL/min (ref >90)
Glucose, Bld: 171 mg/dL — ABNORMAL HIGH (ref 70–99)
POTASSIUM: 3.8 meq/L (ref 3.7–5.3)
Sodium: 140 mEq/L (ref 137–147)

## 2013-11-16 LAB — GLUCOSE, CAPILLARY
GLUCOSE-CAPILLARY: 140 mg/dL — AB (ref 70–99)
GLUCOSE-CAPILLARY: 149 mg/dL — AB (ref 70–99)
Glucose-Capillary: 164 mg/dL — ABNORMAL HIGH (ref 70–99)
Glucose-Capillary: 151 mg/dL — ABNORMAL HIGH (ref 70–99)
Glucose-Capillary: 143 mg/dL — ABNORMAL HIGH (ref 70–99)

## 2013-11-16 LAB — LACTATE DEHYDROGENASE: LDH: 309 U/L — AB (ref 94–250)

## 2013-11-16 NOTE — Progress Notes (Signed)
PULMONARY / CRITICAL CARE MEDICINE   Name: Katie [name] MRN: [id number] DOB: Dec 17, 1950    ADMISSION DATE:  11/11/2013 CONSULTATION DATE:  11/12/2013  REFERRING MD :  Ellene Route PRIMARY SERVICE: Neurosurgery  CHIEF COMPLAINT:  Vent Management s/p craniotomy and resection of brain tumor.  BRIEF PATIENT DESCRIPTION: 63 y.o. F who underwent right parietal occipital craniotomy and resection of brain tumor 3/24.  PCCM consulted post op for vent management.  SIGNIFICANT EVENTS / STUDIES:  3/23 >>> admitted after syncopal episode, CT head demonstrated 2 large R sided lesions with subfalcine herniation. 3/24 >>> to OR for right parietal occipital craniotomy and resection of brain tumor, back to Neuro ICU on vent. 3/25>>>remains vented 3/25 ct head>>>Status post interval right frontal parietal craniotomy for<BR>debulking/ resection of known right temporal parietal mass, with<BR>expected postoperative changes including minimal air and blood<BR>within the resection cavity. Small amount of extra-axial<BR>pneumocephalus. Overall less mass effect with residual 10 mm right<BR>to left midline shift (improved), without hydrocephalus or<BR>ventricular entrapment on today's study.<BR> <BR>Focal loss of the gray-white matter junction involving the mesial<BR>left parietal lobe concerning for acute ischemia.<BR> <BR>Stable appearance of 22 x 19 mm right frontal lobe mass, however<BR>there is a subcentimeter focus of apparent blood along the inferior<BR>margin of the mass which appears new. 3/26 ct chest>>>Lobulated left lower lobe mass 2.0 x 1.7 cm, approximately 3 cm length, consistent with tumor. Subcarinal adenopathy, low attenuation question necrotic, largest node 2.6 cm short axis.  3/26 ct abdo>>>No intra-abdominal or intrapelvic metastatic disease identified  LINES / TUBES: OETT 3/24 >>>3/27 R radial a.line 3/24 >>> OGT 3/24 >>> Foley 3/24 >>>  CULTURES: None  PATH: Brain bx>>>HIGH GRADE POORLY  DIFFERENTIATED NEUROENDOCRINE CARCINOMA, SMALL CELL TYPE  ANTIBIOTICS: None  SUBJECTIVE: Extubated 3/27, stable since then, breathing comfortably, said name this morning.  VITAL SIGNS: Temp:  [97.9 F (36.6 C)-99.2 F (37.3 C)] 97.9 F (36.6 C) (03/28 0754) Pulse Rate:  [68-96] 73 (03/28 0800) Resp:  [13-18] 14 (03/28 0800) BP: (106-164)/(58-87) 113/60 mmHg (03/28 0800) SpO2:  [96 %-100 %] 98 % (03/28 0800) FiO2 (%):  [40 %] 40 % (03/27 1100) Weight:  [54.3 kg (119 lb 11.4 oz)] 54.3 kg (119 lb 11.4 oz) (03/28 0400) HEMODYNAMICS:   VENTILATOR SETTINGS: Vent Mode:  [-]  FiO2 (%):  [40 %] 40 % INTAKE / OUTPUT: Intake/Output     03/27 0701 - 03/28 0700 03/28 0701 - 03/29 0700   I.V. (mL/kg) 1163 (21.4) 50 (0.9)   NG/GT 925    IV Piggyback     Total Intake(mL/kg) 2088 (38.5) 50 (0.9)   Urine (mL/kg/hr) 2540 (1.9) 325 (2.2)   Total Output 2540 325   Net -452 -275          PHYSICAL EXAMINATION: General:  Awake in bed, no distress HEENT: scalp dressing in place, NG feeding tube PULM: CTA with a few crackles left base CV: RRR, no mgr AB: BS+, soft Ext: warm, edema Neuro: awake, slurred speech, answers "Marianna Fuss" when asked her name  LABS:  CBC  Recent Labs Lab 11/13/13 0500 11/14/13 0500 11/15/13 0316  WBC 11.1* 12.6* 13.3*  HGB 10.7* 10.4* 10.7*  HCT 31.2* 30.2* 31.4*  PLT 178 181 193   Coag's No results found for this basename: APTT, INR,  in the last 168 hours BMET  Recent Labs Lab 11/14/13 0500 11/15/13 0316 11/16/13 0345  NA 143 142 140  K 4.2 4.3 3.8  CL 107 103 100  CO2 26 27 28   BUN 34* 30* 26*  CREATININE 0.48* 0.51 0.48*  GLUCOSE 159* 123* 171*   Electrolytes  Recent Labs Lab 11/12/13 0055 11/13/13 0500 11/14/13 0500 11/15/13 0316 11/16/13 0345  CALCIUM 9.4 8.5 8.2* 8.4 8.6  MG 1.8 2.1  --   --   --   PHOS 3.7 3.4  --   --   --    Sepsis Markers No results found for this basename: LATICACIDVEN, PROCALCITON, O2SATVEN,   in the last 168 hours ABG  Recent Labs Lab 11/13/13 0458 11/14/13 0520 11/14/13 1235  PHART 7.425 7.431 7.486*  PCO2ART 39.7 41.2 36.5  PO2ART 298.0* 93.0 92.0   Liver Enzymes  Recent Labs Lab 11/12/13 0055 11/14/13 0500  AST 39* 22  ALT 25 17  ALKPHOS 29* 20*  BILITOT 1.3* 0.3  ALBUMIN 3.7 2.5*   Cardiac Enzymes No results found for this basename: TROPONINI, PROBNP,  in the last 168 hours Glucose  Recent Labs Lab 11/15/13 1226 11/15/13 1603 11/15/13 1956 11/15/13 2347 11/16/13 0332 11/16/13 0756  GLUCAP 130* 148* 140* 153* 164* 151*    Imaging   ASSESSMENT / PLAN:  PULMONARY A: Small cell neuroendocrine, 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 - Hold anticoags further - plan XRT whole brain when able - will likely transfer to [facility] on 3/29 so we can start XRT there next week  INFECTIOUS A:   At risk LLL atx / obstruction P:   - Monitor WBC's/fever curve.  ENDOCRINE A:   At risk for steroid induced hyperglycemia- controlled P:   - ssi  NEUROLOGIC A:   S/p right craniotomy Malignant carcinoma P:   - Per neurosurgery. -keep map 65-105 -avoid fever -steroids, per NS, consider reduction -keppra   Family updated at bedside Exam improving Dispo: likely transfer to [facility] on 3/29 after rounds in anticipation of likely XRT next week  I have personally obtained a history, examined the patient, evaluated laboratory and imaging results,  formulated the assessment and plan and placed orders.  CRITICAL CARE: The patient is critically ill with multiple organ systems failure and requires high complexity decision making for assessment and support, frequent evaluation and titration of therapies, application of advanced monitoring technologies and extensive interpretation of multiple databases. Critical Care Time devoted to patient care services described in this note is 30 minutes.   Jillyn Hidden PCCM Pager: [phone] Cell: [phone] If no response, call [phone]

## 2013-11-16 NOTE — Progress Notes (Signed)
Resp Care note; Unable to do FVC/NIF,pt unable to follow commands.

## 2013-11-16 NOTE — Progress Notes (Signed)
Pt seen and examined. No issues overnight x some drainage from wound. Family at bedside report improvement in LOC.  EXAM: Temp:  [97.9 F (36.6 C)-99.2 F (37.3 C)] 97.9 F (36.6 C) (03/28 0754) Pulse Rate:  [68-96] 80 (03/28 0900) Resp:  [13-18] 18 (03/28 0900) BP: (106-164)/(58-87) 134/67 mmHg (03/28 0900) SpO2:  [96 %-100 %] 98 % (03/28 0900) FiO2 (%):  [40 %] 40 % (03/27 1100) Weight:  [54.3 kg (119 lb 11.4 oz)] 54.3 kg (119 lb 11.4 oz) (03/28 0400) Intake/Output     03/27 0701 - 03/28 0700 03/28 0701 - 03/29 0700   I.V. (mL/kg) 1163 (21.4) 150 (2.8)   NG/GT 925 120   IV Piggyback     Total Intake(mL/kg) 2088 (38.5) 270 (5)   Urine (mL/kg/hr) 2540 (1.9) 325 (1.8)   Total Output 2540 325   Net -452 -55         Arousable to voice Attempts to say name, dysarthric FC BUE, BLE Serosanguinous drainage from inferior margin of wound  LABS: Lab Results  Component Value Date   CREATININE 0.48* 11/16/2013   BUN 26* 11/16/2013   NA 140 11/16/2013   K 3.8 11/16/2013   CL 100 11/16/2013   CO2 28 11/16/2013   Lab Results  Component Value Date   WBC 13.3* 11/15/2013   HGB 10.7* 11/15/2013   HCT 31.4* 11/15/2013   MCV 94.3 11/15/2013   PLT 193 11/15/2013    IMPRESSION: - 63 y.o. female POD# 4 s/p crani for SCLC. Improving neurologic exam  PLAN: - Wound reinforced with extra staples, headwrap applied - Cont observation in ICU - If neurologic exam continues to improve, will likely plan on Tx to [facility] for WBRT early next week. Pt already seen by Dr. Isidore Moos and Dr. Marin Olp.

## 2013-11-16 NOTE — Progress Notes (Signed)
Resp Care Note; Pt unable to follow commands,unable to do NIF /FVC.

## 2013-11-16 NOTE — Evaluation (Signed)
Physical Therapy Evaluation Patient Details Name: Katie [name] MRN: [id number] DOB: Apr 10, 1951 Today's Date: 11/16/2013   History of Present Illness  63 y.o. F s/p R parietal occipital craniotomy and resection of brain tumor 3/24  Clinical Impression  Pt adm with the above dx. Pt presents with impaired cognition with expressive difficulties. Pt was able to follow simple commands with increased time but inconsistently. Pt demonstrated unsafe voluntary movements throughout therapy session limiting activities to EOB and sit to stand transfer. Pt unsafe to sit in chair on this date. Pt to benefit from CIR to maximize functional mobility for safe return home. Pt to benefit from skilled acute PT to address limitations listed below.     Follow Up Recommendations CIR    Equipment Recommendations  Rolling walker with 5" wheels;3in1 (PT)    Recommendations for Other Services Rehab consult     Precautions / Restrictions Precautions Precautions: Fall Restrictions Weight Bearing Restrictions: No      Mobility  Bed Mobility Overal bed mobility: Needs Assistance;+2 for physical assistance Bed Mobility: Supine to Sit     Supine to sit: +2 for physical assistance;Mod assist     General bed mobility comments: pt able to initiate sitting upright. verbal and tactile cues req for sequencing. Pt req LE placement EOB and trunk control support during supine to sit transfer  Transfers Overall transfer level: Needs assistance Equipment used: 2 person hand held assist Transfers: Sit to/from Stand Sit to Stand: +2 physical assistance;Mod assist         General transfer comment: 2 person hand held mod A blocking Bilat feet and knees. pt req simple verbal and tactile cues  Ambulation/Gait                Stairs            Wheelchair Mobility    Modified Rankin (Stroke Patients Only)       Balance Overall balance assessment: Needs assistance Sitting-balance support: Feet  supported;Bilateral upper extremity supported Sitting balance-Leahy Scale: Fair Sitting balance - Comments: pt demonstrated responive reactions to resist and push against forceful (A) in sitting. pt req max A initially seated EOB but relaxed to hand held min A. pt able to sit EOB for 10 min with hand held min A Postural control: Left lateral lean                           Pertinent Vitals/Pain Pt reports no pain    Home Living Family/patient expects to be discharged to:: Private residence Living Arrangements: Spouse/significant other Available Help at Discharge: Family Type of Home: House Home Access: Stairs to enter Entrance Stairs-Rails: None Entrance Stairs-Number of Steps: 3 Home Layout: Two level;Able to live on main level with bedroom/bathroom Home Equipment: None Additional Comments: home living information obtained from pt's brother's and neighbor's best knowledge     Prior Function Level of Independence: Independent               Hand Dominance        Extremity/Trunk Assessment   Upper Extremity Assessment: RUE deficits/detail;LUE deficits/detail ( ) RUE Deficits / Details: pt able to produce voluntary movements with simple commands but deficits difficult to assess due to impaired cognition.      LUE Deficits / Details: pt able to produce voluntary movements with simple commands but deficits difficult to assess due to impaired cognition.   Lower Extremity Assessment: RLE deficits/detail;LLE deficits/detail RLE Deficits / Details:  lacks DF/PF. pt able to produce voluntary movements with simple commands but deficits difficult to assess due to impaired cognition. LLE Deficits / Details: lacks DF/PF. pt able to produce voluntary movements with simple commands but deficits difficult to assess due to impaired cognition.     Communication   Communication: Expressive difficulties (mutterred/mumbled speach )  Cognition Arousal/Alertness:  Lethargic Behavior During Therapy: Impulsive;Flat affect;Restless Overall Cognitive Status: Impaired/Different from baseline Area of Impairment: Orientation;Attention;Memory;Following commands;Safety/judgement;Awareness;Problem solving Orientation Level: Disoriented to;Place;Time;Situation Current Attention Level: Focused Memory: Decreased recall of precautions;Decreased short-term memory Following Commands: Follows one step commands with increased time;Follows one step commands inconsistently Safety/Judgement: Decreased awareness of safety;Decreased awareness of deficits Awareness: Intellectual Problem Solving: Slow processing;Decreased initiation;Difficulty sequencing;Requires verbal cues;Requires tactile cues General Comments: pt able to state name and birth date with expressive difficulties. pt was impulsive frequently reaching for lines and leads. pt able to open L eye to command. unable to track finger with eyes. L sided gaze. pt able to give thumbs up and squeeze L hand on command. verable and tactile cues req for pt safety not to pull on lines and for hand placement during tx.     General Comments General comments (skin integrity, edema, etc.): pt's awareness and ability to answer questions and follow commands improved while sitting EOB    Exercises        Assessment/Plan    PT Assessment Patient needs continued PT services  PT Diagnosis Difficulty walking;Generalized weakness;Altered mental status   PT Problem List Decreased strength;Decreased range of motion;Decreased activity tolerance;Decreased balance;Decreased mobility;Decreased coordination;Decreased cognition;Decreased knowledge of use of DME;Decreased safety awareness  PT Treatment Interventions DME instruction;Gait training;Functional mobility training;Therapeutic activities;Therapeutic exercise;Balance training;Patient/family education   PT Goals (Current goals can be found in the Care Plan section) Acute Rehab PT  Goals Patient Stated Goal: none stated PT Goal Formulation: With patient Time For Goal Achievement: 11/23/13 Potential to Achieve Goals: Good    Frequency Min 4X/week   Barriers to discharge        End of Session Equipment Utilized During Treatment: Gait belt Activity Tolerance: Patient limited by lethargy (Simultaneous filing. User may not have seen previous data.) Patient left: in bed;with call bell/phone within reach         Time: 1350-1418 PT Time Calculation (min): 28 min   Charges:   PT Evaluation $Initial PT Evaluation Tier I: 1 Procedure PT Treatments $Therapeutic Activity: 8-22 mins   PT G Codes:          Kalum Minner SPT 11/16/2013, 4:29 PM

## 2013-11-16 NOTE — Evaluation (Signed)
Physical Therapy Evaluation Patient Details Name: Katie [name] MRN: [id number] DOB: 1951/07/26 Today's Date: 11/16/2013   History of Present Illness  63 y.o. F s/p R parietal occipital craniotomy and resection of brain tumor 3/24  Clinical Impression  Pt adm with the above dx. Pt presents with impaired cognition with expressive difficulties. Pt was able to follow simple commands with increased time but inconsistently. Pt demonstrated unsafe voluntary movements throughout therapy session limiting activities to EOB and sit to stand transfer. Pt unsafe to sit in chair on this date. Pt to benefit from CIR to maximize functional mobility for safe return home. Pt to benefit from skilled acute PT to address limitations listed below.     Follow Up Recommendations CIR    Equipment Recommendations  Rolling walker with 5" wheels;3in1 (PT)    Recommendations for Other Services Rehab consult     Precautions / Restrictions Precautions Precautions: Fall Restrictions Weight Bearing Restrictions: No      Mobility  Bed Mobility Overal bed mobility: Needs Assistance;+2 for physical assistance Bed Mobility: Supine to Sit     Supine to sit: +2 for physical assistance;Mod assist     General bed mobility comments: pt able to initiate sitting upright. verbal and tactile cues req for sequencing. Pt req LE placement EOB and trunk control support during supine to sit transfer  Transfers Overall transfer level: Needs assistance Equipment used: 2 person hand held assist Transfers: Sit to/from Stand Sit to Stand: +2 physical assistance;Mod assist         General transfer comment: 2 person hand held mod A blocking Bilat feet and knees. pt req simple verbal and tactile cues  Ambulation/Gait                Stairs            Wheelchair Mobility    Modified Rankin (Stroke Patients Only)       Balance Overall balance assessment: Needs assistance Sitting-balance support: Feet  supported;Bilateral upper extremity supported Sitting balance-Leahy Scale: Fair Sitting balance - Comments: pt demonstrated responive reactions to resist and push against forceful (A) in sitting. pt req max A initially seated EOB but relaxed to hand held min A. pt able to sit EOB for 10 min with hand held min A Postural control: Left lateral lean                           Pertinent Vitals/Pain Pt reports no pain    Home Living Family/patient expects to be discharged to:: Private residence Living Arrangements: Spouse/significant other Available Help at Discharge: Family Type of Home: House Home Access: Stairs to enter Entrance Stairs-Rails: None Entrance Stairs-Number of Steps: 3 Home Layout: Two level;Able to live on main level with bedroom/bathroom Home Equipment: None Additional Comments: home living information obtained from pt's brother's and neighbor's best knowledge     Prior Function Level of Independence: Independent               Hand Dominance        Extremity/Trunk Assessment   Upper Extremity Assessment: RUE deficits/detail;LUE deficits/detail ( ) RUE Deficits / Details: pt able to produce voluntary movements with simple commands but deficits difficult to assess due to impaired cognition.      LUE Deficits / Details: pt able to produce voluntary movements with simple commands but deficits difficult to assess due to impaired cognition.   Lower Extremity Assessment: RLE deficits/detail;LLE deficits/detail RLE Deficits / Details:  lacks DF/PF. pt able to produce voluntary movements with simple commands but deficits difficult to assess due to impaired cognition. LLE Deficits / Details: lacks DF/PF. pt able to produce voluntary movements with simple commands but deficits difficult to assess due to impaired cognition.     Communication   Communication: Expressive difficulties (mutterred/mumbled speach )  Cognition Arousal/Alertness:  Lethargic Behavior During Therapy: Impulsive;Flat affect;Restless Overall Cognitive Status: Impaired/Different from baseline Area of Impairment: Orientation;Attention;Memory;Following commands;Safety/judgement;Awareness;Problem solving Orientation Level: Disoriented to;Place;Time;Situation Current Attention Level: Focused Memory: Decreased recall of precautions;Decreased short-term memory Following Commands: Follows one step commands with increased time;Follows one step commands inconsistently Safety/Judgement: Decreased awareness of safety;Decreased awareness of deficits Awareness: Intellectual Problem Solving: Slow processing;Decreased initiation;Difficulty sequencing;Requires verbal cues;Requires tactile cues General Comments: pt able to state name and birth date with expressive difficulties. pt was impulsive frequently reaching for lines and leads. pt able to open L eye to command. unable to track finger with eyes. L sided gaze. pt able to give thumbs up and squeeze L hand on command. verable and tactile cues req for pt safety not to pull on lines and for hand placement during tx.     General Comments General comments (skin integrity, edema, etc.): pt's awareness and ability to answer questions and follow commands improved while sitting EOB    Exercises        Assessment/Plan    PT Assessment Patient needs continued PT services  PT Diagnosis Difficulty walking;Generalized weakness;Altered mental status   PT Problem List Decreased strength;Decreased range of motion;Decreased activity tolerance;Decreased balance;Decreased mobility;Decreased coordination;Decreased cognition;Decreased knowledge of use of DME;Decreased safety awareness  PT Treatment Interventions DME instruction;Gait training;Functional mobility training;Therapeutic activities;Therapeutic exercise;Balance training;Patient/family education   PT Goals (Current goals can be found in the Care Plan section) Acute Rehab PT  Goals Patient Stated Goal: none stated PT Goal Formulation: With patient Time For Goal Achievement: 11/23/13 Potential to Achieve Goals: Good    Frequency Min 4X/week   Barriers to discharge        End of Session Equipment Utilized During Treatment: Gait belt Activity Tolerance: Patient limited by lethargy (Simultaneous filing. User may not have seen previous data.) Patient left: in bed;with call bell/phone within reach         Time: 1350-1418 PT Time Calculation (min): 28 min   Charges:   PT Evaluation $Initial PT Evaluation Tier I: 1 Procedure PT Treatments $Therapeutic Activity: 8-22 mins   PT G Codes:          Kohler,Andrew SPT 11/16/2013, 4:29 PM  Agree with above assessment.  Kittie Plater, PT, DPT Pager #: [phone] Office #: [phone]

## 2013-11-17 ENCOUNTER — Inpatient Hospital Stay (HOSPITAL_COMMUNITY): Payer: BC Managed Care – PPO

## 2013-11-17 LAB — BASIC METABOLIC PANEL
BUN: 29 mg/dL — ABNORMAL HIGH (ref 6–23)
CALCIUM: 8.8 mg/dL (ref 8.4–10.5)
CO2: 27 mEq/L (ref 19–32)
Chloride: 99 mEq/L (ref 96–112)
Creatinine, Ser: 0.46 mg/dL — ABNORMAL LOW (ref 0.50–1.10)
GFR calc Af Amer: >90 mL/min (ref >90)
Glucose, Bld: 152 mg/dL — ABNORMAL HIGH (ref 70–99)
Potassium: 4.4 mEq/L (ref 3.7–5.3)
SODIUM: 138 meq/L (ref 137–147)

## 2013-11-17 LAB — GLUCOSE, CAPILLARY
GLUCOSE-CAPILLARY: 142 mg/dL — AB (ref 70–99)
Glucose-Capillary: 120 mg/dL — ABNORMAL HIGH (ref 70–99)
Glucose-Capillary: 123 mg/dL — ABNORMAL HIGH (ref 70–99)
Glucose-Capillary: 129 mg/dL — ABNORMAL HIGH (ref 70–99)
Glucose-Capillary: 132 mg/dL — ABNORMAL HIGH (ref 70–99)
Glucose-Capillary: 151 mg/dL — ABNORMAL HIGH (ref 70–99)
Glucose-Capillary: 158 mg/dL — ABNORMAL HIGH (ref 70–99)

## 2013-11-17 LAB — LACTATE DEHYDROGENASE: LDH: 444 U/L — AB (ref 94–250)

## 2013-11-17 LAB — TRIGLYCERIDES: TRIGLYCERIDES: 66 mg/dL (ref <150)

## 2013-11-17 MED ORDER — RANITIDINE HCL 150 MG/10ML PO SYRP
150.0000 mg | ORAL_SOLUTION | Freq: Two times a day (BID) | ORAL | Status: DC
  Administered 2013-11-17 – 2013-12-02 (×30): 150 mg
  Filled 2013-11-17 (×38): qty 10

## 2013-11-17 NOTE — Evaluation (Signed)
Clinical/Bedside Swallow Evaluation Patient Details  Name: Katie [name] MRN: [id number] Date of Birth: [birth year]  Today's Date: 11/17/2013 Time: 1015-1105 SLP Time Calculation (min): 50 min  Past Medical History:  Past Medical History  Diagnosis Date  . Anxiety     takes lexapro   Past Surgical History:  Past Surgical History  Procedure Laterality Date  . Appendectomy    . Colon surgery      polyps  removed  . Craniotomy Right 11/12/2013    Procedure: RIGHT PARIETAL CRANIOTOMY;  Surgeon: Kristeen Miss, MD;  Location: [facility];  Service: Neurosurgery;  Laterality: Right;   HPI:  63 year old female admitted 11/11/13 for craniotomy and resection of right fronto-parietal tumor.  PMH insignificant.  Pt also has LLL mass c/w tumor.   Assessment / Plan / Recommendation Clinical Impression  Oral care completed with suction. Oral motor strength and function appear adequate, however, pt is lethargic and has generalized weakness.  Volitional cough is significantly weak. Small boluses of puree were given and appeared to be tolerated without overt s/s aspiration.  Minimal laryngeal elevation noted on palpation. Ice chip was given x2, with immediate and strong reflexive cough response.  At this time, po intake would be premature.  Recommend continued NPO status with oral care using suction.  Moistened swabs may be used to keep pt's mouth from being too dry.  Family was educated re: squeezing out excess water and throwing away used swabs immediately.  Pt is transferring over to [facility] for continued treatment/radiation.  Recommend continued ST intervention to assess po readiness.    Aspiration Risk  Severe    Diet Recommendation NPO   Medication Administration: Via alternative means    Other  Recommendations Oral Care Recommendations: Oral care Q4 per protocol Other Recommendations: Have oral suction available   Follow Up Recommendations   (TBD)    Frequency and Duration min 1 x/week  1 week    Pertinent Vitals/Pain VSS, no pain reported    SLP Swallow Goals  po readiness   Swallow Study Prior Functional Status   No prior dysphagia. Regular diet/thin liquids prior to admit.    General Date of Onset: 11/11/13 HPI: 63 year old female admitted 11/11/13 for craniotomy and resection of right fronto-parietal tumor.  PMH insignificant.  Pt also has LLL mass c/w tumor. Type of Study: Bedside swallow evaluation Previous Swallow Assessment: n/a Diet Prior to this Study: NPO (NG) Temperature Spikes Noted: No Respiratory Status: Room air History of Recent Intubation: Yes Length of Intubations (days): 4 days Date extubated: 11/15/13 Behavior/Cognition: Lethargic;Cooperative;Pleasant mood;Hard of hearing Oral Cavity - Dentition: Adequate natural dentition Self-Feeding Abilities: Total assist Patient Positioning: Upright in bed Baseline Vocal Quality: Clear;Low vocal intensity Volitional Cough: Weak Volitional Swallow: Unable to elicit    Oral/Motor/Sensory Function Overall Oral Motor/Sensory Function: Appears within functional limits for tasks assessed   Ice Chips Ice chips: Impaired Oral Phase Impairments: Reduced labial seal Pharyngeal Phase Impairments: Cough - Immediate;Decreased hyoid-laryngeal movement;Suspected delayed Swallow   Thin Liquid Thin Liquid: Not tested    Nectar Thick Nectar Thick Liquid: Not tested   Honey Thick Honey Thick Liquid: Not tested   Puree Puree: Impaired Presentation: Spoon Pharyngeal Phase Impairments: Suspected delayed Swallow;Decreased hyoid-laryngeal movement   Solid   GO    Solid: Not tested      Celia B. Quentin [facility], [place] [phone]  Shonna Chock 11/17/2013,11:21 AM

## 2013-11-17 NOTE — Progress Notes (Signed)
Pt seen and examined. No issues overnight. Pt is more awake, speaking this am.   EXAM: Temp:  [97.5 F (36.4 C)-98.8 F (37.1 C)] 98.8 F (37.1 C) (03/29 0316) Pulse Rate:  [68-89] 83 (03/29 1000) Resp:  [12-18] 12 (03/29 1000) BP: (119-159)/(61-87) 139/69 mmHg (03/29 1000) SpO2:  [92 %-98 %] 94 % (03/29 1000) Weight:  [54.3 kg (119 lb 11.4 oz)] 54.3 kg (119 lb 11.4 oz) (03/29 0316) Intake/Output     03/28 0701 - 03/29 0700 03/29 0701 - 03/30 0700   I.V. (mL/kg) 540 (9.9) 60 (1.1)   NG/GT 920 80   Total Intake(mL/kg) 1460 (26.9) 140 (2.6)   Urine (mL/kg/hr) 2500 (1.9) 175 (0.9)   Total Output 2500 175   Net -1040 -35         Drowsy but easily arousable. 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

## 2013-11-17 NOTE — Progress Notes (Signed)
PULMONARY / CRITICAL CARE MEDICINE   Name: Katie [name] MRN: [id number] DOB: 1951/06/02    ADMISSION DATE:  11/11/2013 CONSULTATION DATE:  11/12/2013  REFERRING MD :  Ellene Route PRIMARY SERVICE: Neurosurgery  CHIEF COMPLAINT:  Vent Management s/p craniotomy and resection of brain tumor.  BRIEF PATIENT DESCRIPTION: 63 y.o. F who underwent right parietal occipital craniotomy and resection of brain tumor 3/24.  PCCM consulted post op for vent management.  SIGNIFICANT EVENTS / STUDIES:  3/23 >>> admitted after syncopal episode, CT head demonstrated 2 large R sided lesions with subfalcine herniation. 3/24 >>> to OR for right parietal occipital craniotomy and resection of brain tumor, back to Neuro ICU on vent. 3/25>>>remains vented 3/25 ct head>>>Status post interval right frontal parietal craniotomy for<BR>debulking/ resection of known right temporal parietal mass, with<BR>expected postoperative changes including minimal air and blood<BR>within the resection cavity. Small amount of extra-axial<BR>pneumocephalus. Overall less mass effect with residual 10 mm right<BR>to left midline shift (improved), without hydrocephalus or<BR>ventricular entrapment on today's study.<BR> <BR>Focal loss of the gray-white matter junction involving the mesial<BR>left parietal lobe concerning for acute ischemia.<BR> <BR>Stable appearance of 22 x 19 mm right frontal lobe mass, however<BR>there is a subcentimeter focus of apparent blood along the inferior<BR>margin of the mass which appears new. 3/26 ct chest>>>Lobulated left lower lobe mass 2.0 x 1.7 cm, approximately 3 cm length, consistent with tumor. Subcarinal adenopathy, low attenuation question necrotic, largest node 2.6 cm short axis.  3/26 ct abdo>>>No intra-abdominal or intrapelvic metastatic disease identified  LINES / TUBES: OETT 3/24 >>>3/27 R radial a.line 3/24 >>> OGT 3/24 >>> Foley 3/24 >>>  CULTURES: None  PATH: Brain bx>>>HIGH GRADE POORLY  DIFFERENTIATED NEUROENDOCRINE CARCINOMA, SMALL CELL TYPE  ANTIBIOTICS: None  SUBJECTIVE: She is more awake and alert today, pulled NG tube out  VITAL SIGNS: Temp:  [97.5 F (36.4 C)-98.8 F (37.1 C)] 98.8 F (37.1 C) (03/29 0316) Pulse Rate:  [68-89] 78 (03/29 0800) Resp:  [12-18] 15 (03/29 0800) BP: (119-159)/(61-87) 144/78 mmHg (03/29 0800) SpO2:  [92 %-98 %] 93 % (03/29 0800) Weight:  [54.3 kg (119 lb 11.4 oz)] 54.3 kg (119 lb 11.4 oz) (03/29 0316) HEMODYNAMICS:   VENTILATOR SETTINGS:   INTAKE / OUTPUT: Intake/Output     03/28 0701 - 03/29 0700 03/29 0701 - 03/30 0700   I.V. (mL/kg) 540 (9.9) 20 (0.4)   NG/GT 920    Total Intake(mL/kg) 1460 (26.9) 20 (0.4)   Urine (mL/kg/hr) 2500 (1.9) 175 (1.7)   Total Output 2500 175   Net -1040 -155          PHYSICAL EXAMINATION:  General:  Awake in bed, no distress HEENT: scalp dressing in place, NG feeding tube PULM: CTA with a few crackles left base CV: RRR, no mgr AB: BS+, soft Ext: warm, edema Neuro: awake, slurred speech, now speaking in full sentences, somewhat confused, maew  LABS:  CBC  Recent Labs Lab 11/13/13 0500 11/14/13 0500 11/15/13 0316  WBC 11.1* 12.6* 13.3*  HGB 10.7* 10.4* 10.7*  HCT 31.2* 30.2* 31.4*  PLT 178 181 193   Coag's No results found for this basename: APTT, INR,  in the last 168 hours BMET  Recent Labs Lab 11/15/13 0316 11/16/13 0345 11/17/13 0256  NA 142 140 138  K 4.3 3.8 4.4  CL 103 100 99  CO2 27 28 27   BUN 30* 26* 29*  CREATININE 0.51 0.48* 0.46*  GLUCOSE 123* 171* 152*   Electrolytes  Recent Labs Lab 11/12/13 0055 11/13/13 0500  11/15/13 0316  11/16/13 0345 11/17/13 0256  CALCIUM 9.4 8.5  < > 8.4 8.6 8.8  MG 1.8 2.1  --   --   --   --   PHOS 3.7 3.4  --   --   --   --   < > = values in this interval not displayed. Sepsis Markers No results found for this basename: LATICACIDVEN, PROCALCITON, O2SATVEN,  in the last 168 hours ABG  Recent Labs Lab  11/13/13 0458 11/14/13 0520 11/14/13 1235  PHART 7.425 7.431 7.486*  PCO2ART 39.7 41.2 36.5  PO2ART 298.0* 93.0 92.0   Liver Enzymes  Recent Labs Lab 11/12/13 0055 11/14/13 0500  AST 39* 22  ALT 25 17  ALKPHOS 29* 20*  BILITOT 1.3* 0.3  ALBUMIN 3.7 2.5*   Cardiac Enzymes No results found for this basename: TROPONINI, PROBNP,  in the last 168 hours Glucose  Recent Labs Lab 11/16/13 1136 11/16/13 1605 11/16/13 2009 11/17/13 0025 11/17/13 0318 11/17/13 0733  GLUCAP 143* 140* 149* 120* 151* 123*    Imaging   ASSESSMENT / PLAN:  PULMONARY A: Small cell neuroendocrine, 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 - Hold anticoags further - plan XRT whole brain when able - will likely transfer to [facility] on 3/29 so we can start XRT there next week  INFECTIOUS A:   At risk LLL atx / obstruction P:   - Monitor WBC's/fever curve.  ENDOCRINE A:   At risk for steroid induced hyperglycemia- controlled P:   - ssi  NEUROLOGIC A:   S/p right craniotomy Malignant carcinoma P:   - Per neurosurgery. -keep map 65-105 -avoid fever -steroids, per NS -keppra   Husband, sister updated at bedside Exam improving Dispo: OK to transfer to [facility] from my stand point   Jillyn Hidden PCCM Pager: [phone] Cell: [phone] If no response, call [phone]

## 2013-11-17 NOTE — Progress Notes (Signed)
Pt performed NIF and VC with good effort and technique.  NIF was -35 and VC was 1.0L.  Pt tolerated well, RT to monitor and assess as needed.

## 2013-11-18 ENCOUNTER — Inpatient Hospital Stay (HOSPITAL_COMMUNITY): Payer: BC Managed Care – PPO

## 2013-11-18 DIAGNOSIS — R739 Hyperglycemia, unspecified: Secondary | ICD-10-CM | POA: Diagnosis present

## 2013-11-18 DIAGNOSIS — W19XXXA Unspecified fall, initial encounter: Secondary | ICD-10-CM | POA: Diagnosis present

## 2013-11-18 DIAGNOSIS — R64 Cachexia: Secondary | ICD-10-CM

## 2013-11-18 DIAGNOSIS — E43 Unspecified severe protein-calorie malnutrition: Secondary | ICD-10-CM

## 2013-11-18 DIAGNOSIS — I10 Essential (primary) hypertension: Secondary | ICD-10-CM | POA: Diagnosis present

## 2013-11-18 DIAGNOSIS — T380X5A Adverse effect of glucocorticoids and synthetic analogues, initial encounter: Secondary | ICD-10-CM | POA: Diagnosis present

## 2013-11-18 DIAGNOSIS — R7309 Other abnormal glucose: Secondary | ICD-10-CM

## 2013-11-18 DIAGNOSIS — R636 Underweight: Secondary | ICD-10-CM | POA: Diagnosis present

## 2013-11-18 DIAGNOSIS — C3490 Malignant neoplasm of unspecified part of unspecified bronchus or lung: Secondary | ICD-10-CM | POA: Diagnosis present

## 2013-11-18 LAB — GLUCOSE, CAPILLARY
GLUCOSE-CAPILLARY: 143 mg/dL — AB (ref 70–99)
GLUCOSE-CAPILLARY: 144 mg/dL — AB (ref 70–99)
GLUCOSE-CAPILLARY: 145 mg/dL — AB (ref 70–99)
GLUCOSE-CAPILLARY: 145 mg/dL — AB (ref 70–99)
Glucose-Capillary: 123 mg/dL — ABNORMAL HIGH (ref 70–99)
Glucose-Capillary: 152 mg/dL — ABNORMAL HIGH (ref 70–99)
Glucose-Capillary: 135 mg/dL — ABNORMAL HIGH (ref 70–99)

## 2013-11-18 LAB — LACTATE DEHYDROGENASE: LDH: 342 U/L — AB (ref 94–250)

## 2013-11-18 MED ORDER — PIVOT 1.5 CAL PO LIQD
1000.0000 mL | ORAL | Status: DC
  Administered 2013-11-18 – 2013-11-21 (×3): 1000 mL
  Filled 2013-11-18 (×12): qty 1000

## 2013-11-18 NOTE — Progress Notes (Signed)
Pt pulled put NG tube replace and xray taken

## 2013-11-18 NOTE — Progress Notes (Signed)
I visited with Katie [name] and her family today. She is now at [facility]. She is still a bit agitated and unable to follow complex commands.  I do not think she is ready to lay still for radiotherapy.  The patient's husbands understands from Dr Marin Olp that she may start chemotherapy as an inpatient. I do not yet see his note, but feel comfortable deferring RT for a week if he feels her performance status is sufficient to start systemic therapy now.  Alternatively, we could hold all oncologic therapy until she has more time to recover from brain surgery. -----------------------------------  Eppie Gibson, MD

## 2013-11-18 NOTE — Progress Notes (Signed)
Attempted NIF & VC however, pt had difficulty following commands & somewhat sleepy.  Three attempst at NIF = -32, three attempts of VC = 1.0L  Will re-do test if/when pt. Is more alert.

## 2013-11-18 NOTE — Progress Notes (Signed)
NUTRITION FOLLOW-UP  DOCUMENTATION CODES Per approved criteria  -Moderate (non-severe) malnutrition in the context of chronic illness   Pt meets criteria for moderate MALNUTRITION in the context of chronic illness as evidenced by mild muscle wasting and fat loss in clavicles, upper arms, and hands.  INTERVENTION: - To better meet nutritional needs, increase Pivot 1.5 to 45 ml/hr via NGT. At goal rate, tube feeding regimen will provide 1620 kcal, 101 grams of protein, and 820 ml of H2O. This will meet 98% of estimated calorie needs and 100% of estimated protein needs.  - Continue liquid multivitamin per tube daily. - RD to continue to follow nutrition care plan.  NUTRITION DIAGNOSIS: Inadequate oral intake related to inability to eat as evidenced by NPO/Vent status - ongoing, now only as evidenced by NPO status   Goal: Pt to meet >/= 90% of their estimated nutrition needs - met with TF  Monitor:  Weights, labs, TF tolerance, diet advancement  ASSESSMENT: 63 y.o. F who apparently had a syncopal episode 3/23 falling and hitting her head in the left frontal region. A CT scan of the brain demonstrated at least 2 large right-sided lesions one frontally and one parietal occipitally there is 12 mm of shift with subfalcine herniation. She underwent right parietal occipital craniotomy and resection of brain tumor 3/24. PCCM consulted post op for vent management.  3/24 - Per Chart, the husband reported that pt has been failing in her normal activities of daily living for the last 6 weeks. She has become confused, disoriented, and has had some speech difficulties.  Pt asleep at time of visit. Propofol hanging but not in use. No family present at this time. RD received consult for TF management.   3/27 - Brain bx reveals HIGH GRADE POORLY DIFFERENTIATED NEUROENDOCRINE CARCINOMA, SMALL CELL TYPE.  3/30 - Extubated 3/27. Oncology following for pt with newly diagnosed small cell lung cancer, with brain  metastases.  Had SLP bedside swallow evaluation 3/29 who noted pt with severe aspiration risk with recommendations for NPO. Pulled out NGT today but was replaced, per abdominal x-ray today "Nasogastric tube enters the proximal stomach, with side port at the GE junction". Per conversation with RN, pt tolerating TF well with no residuals. Sister present at beside and reports pt eating well PTA, 3 meals/day with good appetite and stable weight of 130-135 pounds. Reported 15 pound weight loss since November 2014 was related to pt cutting back on sweets. Weight up 6 pounds since admission.   TF: Pivot 1.5 at 10m/hr via NGT - provides 1440 kcal, 90 grams of protein, and 729 ml of H2O and meets 87% estimated calorie needs and 100% of estimated protein needs  BUN/Cr elevated but trending down  CBG (last 3)   Recent Labs  11/18/13 0005 11/18/13 0430 11/18/13 0749  GLUCAP 152* 123* 143*     Nutrition Focused Physical Exam:  Subcutaneous Fat:  Orbital Region: WNL Upper Arm Region: mild fat loss and mild muscle wasting Thoracic and Lumbar Region: mild fat loss  Muscle:  Temple Region: WNL Clavicle Bone Region: mild fat loss and mild muscle wasting Clavicle and Acromion Bone Region: mild fat loss and mild muscle wasting Scapular Bone Region: NA Dorsal Hand: mild fat loss and mild muscle wasting Patellar Region: mild muscle wasting Anterior Thigh Region: mild muscle wasting Posterior Calf Region: mild muscle wasting  Edema: None noted   Height: Ht Readings from Last 1 Encounters:  11/11/13 _0  (1.702 m)    Weight: Wt  Readings from Last 1 Encounters:  11/18/13 124 lb 1.9 oz (56.3 kg)  Admit wt 118 lb  BMI:  Body mass index is 19.44 kg/(m^2). WNL  Estimated Nutritional Needs: Kcal: 1650-1900 Protein: 90-110 grams Fluid: >1.6 L/day  Skin: closed incision on head, abrasion on left elbow, ecchymosis on left hip  Diet Order: NPO     Intake/Output Summary (Last 24 hours) at  11/18/13 1003 Last data filed at 11/18/13 0900  Gross per 24 hour  Intake   1370 ml  Output   1150 ml  Net    220 ml    Last BM: PTA   Labs:   Recent Labs Lab 11/12/13 0055  11/13/13 0500  11/15/13 0316 11/16/13 0345 11/17/13 0256  NA 138  < > 143  < > 142 140 138  K 4.0  < > 4.3  < > 4.3 3.8 4.4  CL 100  --  108  < > 103 100 99  CO2 22  --  24  < > _0 BUN 14  --  31*  < > 30* 26* 29*  CREATININE 0.56  --  0.64  < > 0.51 0.48* 0.46*  CALCIUM 9.4  --  8.5  < > 8.4 8.6 8.8  MG 1.8  --  2.1  --   --   --   --   PHOS 3.7  --  3.4  --   --   --   --   GLUCOSE 138*  < > 157*  < > 123* 171* 152*  < > = values in this interval not displayed.  CBG (last 3)   Recent Labs  11/18/13 0005 11/18/13 0430 11/18/13 0749  GLUCAP 152* 123* 143*    Scheduled Meds: . antiseptic oral rinse  15 mL Mouth Rinse QID  . dexamethasone  10 mg Intravenous 4 times per day  . feeding supplement (PIVOT 1.5 CAL)  1,000 mL Per Tube Q24H  . insulin aspart  0-15 Units Subcutaneous 6 times per day  . levETIRAcetam  500 mg Per Tube BID  . multivitamin  5 mL Per Tube Daily  . ranitidine  150 mg Per Tube BID    Continuous Infusions: . sodium chloride 20 mL (11/17/13 1323)    Mikey College MS, RD, LDN [phone] Pager [phone] After Hours Pager

## 2013-11-18 NOTE — Progress Notes (Signed)
Patient alert and answering questions appropriately, with coaching and multiple attempts-- patient unable to correctly perform NIF at this time, VC 1.5L with good patient effort.

## 2013-11-18 NOTE — Progress Notes (Signed)
PROGRESS NOTE   [name] QQV:[id number] DOB: Mar 04, 1951 DOA: 11/11/2013 PCP: Ezequiel Kayser, MD  Brief narrative: Katie [name] is an 63 y.o. female with a PMH of tobacco abuse and hyperlipidemia who was admitted by neurosurgery on 11/11/13 after suffering a syncopal episode resulting in head trauma. A CT scan of the brain, done on admission, demonstrated 2 large right-sided lesions in the frontal and parietal/occipital area with 12 mm of shift and subfalcine herniation.  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 Ellene Route of neurosurgery and underwent a right parietal/occipital craniotomy with resection of brain tumor on 11/12/13. Pathologic diagnosis from the frozen section was obtained and showed malignant carcinoma. The patient was intubated postoperatively/successfully extubated 11/15/1498 the direction of pulmonology. Tube feeding is being provided through a NG tube. Patient underwent followup CT scanning of the brain on 11/13/13 which showed less shift and mass effect with no significant bleeding and tumor bed. She continues on Keppra and Decadron, with recommendations to slowly taper dexamethasone down to 2 mg twice a day.  Unfortunately, too restless to begin XRT. Active Problems:   Underweight / Severe protein calorie malnutrition / Malignancy associated cachexia BMI is 18.7. She is receiving tube feeding. Family reports significant weight loss since November (15 lbs).  Likely from malignancy associated cachexia.   Acute respiratory failure Resolved.  Extubated 11/15/13 after being intubated x 3 days.   Altered mental status Secondary to brain metastasis and major surgery. Continue wrist restraints for safety.   Small cell lung cancer Dr. Marin Olp saw the patient in consultation on 11/15/13.  CT scan of the chest and abdomen revealed a left lower lobe nodule and significant mediastinal adenopathy.  Whole brain radiation was recommended,  and therefore she was transferred to [facility]. She'll also need chemotherapy.   HTN (hypertension) Being managed with when necessary labetalol.   Steroid-induced hyperglycemia Continue SSI for hyperglycemia. CBGs 123-158.   DVT Prophylaxis Continue SCDs.  Code Status: Full. Family Communication: Family updated at bedside. Disposition Plan: CIR   IV access:  Peripheral IV  Medical Consultants:  Dr. Eppie Gibson, Radiation Oncology  Dr. Burney Gauze, Oncology  Dr. Kristeen Miss, Neurosurgery  Other Consultants:  Physical therapy  Speech therapy  Dietitian  Anti-infectives:  None  HPI/Subjective: [name] is lethargic and nonverbal. Her family is at the bedside and reports that she recognizes them and interacts when she is awake. She remains restless and has pulled her NG tube out once are ready today.  Objective: Filed Vitals:   11/18/13 0200 11/18/13 0400 11/18/13 0456 11/18/13 0500  BP: 145/66   138/72  Pulse: 30   70  Temp:  98.1 F (36.7 C)    TempSrc:  Axillary    Resp: 13   12  Height:      Weight:   56.3 kg (124 lb 1.9 oz)   SpO2: 92%   94%    Intake/Output Summary (Last 24 hours) at 11/18/13 0723 Last data filed at 11/18/13 0500  Gross per 24 hour  Intake   1270 ml  Output   1200 ml  Net     70 ml    Exam: Gen:  NAD, lethargic with psychomotor restlessness. Cardiovascular:  RRR, No M/R/G Respiratory:  Lungs CTAB Gastrointestinal:  Abdomen soft, NT/ND, + BS Extremities:  No C/E/C  Data Reviewed: Basic Metabolic Panel:  Recent Labs Lab 11/12/13 0055  11/13/13 0500 11/14/13 0500 11/15/13 0316 11/16/13 0345 11/17/13 3329  NA 138  < > 143 143 142 140 138  K 4.0  < > 4.3 4.2 4.3 3.8 4.4  CL 100  --  108 107 103 100 99  CO2 22  --  24 26 27 28 27   GLUCOSE 138*  < > 157* 159* 123* 171* 152*  BUN 14  --  31* 34* 30* 26* 29*  CREATININE 0.56  --  0.64 0.48* 0.51 0.48* 0.46*  CALCIUM 9.4  --  8.5 8.2* 8.4 8.6 8.8  MG 1.8  --  2.1  --   --    --   --   PHOS 3.7  --  3.4  --   --   --   --   < > = values in this interval not displayed. GFR Estimated Creatinine Clearance: 63.1 ml/min (by C-G formula based on Cr of 0.46). Liver Function Tests:  Recent Labs Lab 11/12/13 0055 11/14/13 0500  AST 39* 22  ALT 25 17  ALKPHOS 29* 20*  BILITOT 1.3* 0.3  PROT 6.1 4.6*  ALBUMIN 3.7 2.5*   CBC:  Recent Labs Lab 11/12/13 0759 11/12/13 0913 11/13/13 0500 11/14/13 0500 11/15/13 0316  WBC  --   --  11.1* 12.6* 13.3*  NEUTROABS  --   --   --  11.3* 11.9*  HGB 11.6* 9.2* 10.7* 10.4* 10.7*  HCT 34.0* 27.0* 31.2* 30.2* 31.4*  MCV  --   --  94.3 94.4 94.3  PLT  --   --  178 181 193   CBG:  Recent Labs Lab 11/17/13 0733 11/17/13 1150 11/17/13 1312 11/17/13 1609 11/17/13 1959  GLUCAP 123* 132* 129* 142* 158*   Lipid Profile  Recent Labs  11/17/13 0256  TRIG 63   Microbiology Recent Results (from the past 240 hour(s))  MRSA PCR SCREENING     Status: None   Collection Time    11/11/13  9:02 PM      Result Value Ref Range Status   MRSA by PCR NEGATIVE  NEGATIVE Final   Comment:            The GeneXpert MRSA Assay (FDA     approved for NASAL specimens     only), is one component of a     comprehensive MRSA colonization     surveillance program. It is not     intended to diagnose MRSA     infection nor to guide or     monitor treatment for     MRSA infections.     Procedures and Diagnostic Studies: Dg Abd 1 View  11/18/2013   CLINICAL DATA:  NG tube replaced for feeding.  EXAM: ABDOMEN - 1 VIEW  COMPARISON:  KUB from yesterday  FINDINGS: There is a nasogastric tube with tip terminating in the proximal stomach. The side port is at the GE junction.  There is motion artifact, decreasing sensitivity. Contrast remains present throughout the colon, without evidence of obstruction. No gross opacification of the lung bases.  IMPRESSION: Nasogastric tube enters the proximal stomach, with side port at the GE junction.  Advancement by 5 cm would provide more secure positioning.   Electronically Signed   By: Jorje Guild M.D.   On: 11/18/2013 05:03   Ct Head Wo Contrast  11/13/2013   CLINICAL DATA:  Follow-up brain tumor resection.  EXAM: CT HEAD WITHOUT CONTRAST  TECHNIQUE: Contiguous axial images were obtained from the base of the skull through the vertex without intravenous contrast.  COMPARISON:  MR HEAD WO/W CM dated 11/11/2013; CT HEAD W/O CM dated 11/11/2013  FINDINGS: Status post interval right frontal parietal craniotomy for debulking/ resection of right temporoparietal mass. Small amount of hyperdense blood products and air within the resection cavity. Small amount of extra-axial pneumocephalus mass seen.  No apparent change in mildly hyperdense right frontal lobe 22 x 19 mm mass, with a subcentimeter focus along the inferior margin of hyperdense possible blood products, axial 15/33. Right cerebral probable vasogenic edema, corresponding to MRI abnormality. 10 mm right to left midline shift, previously 12 mm. The right lateral ventricle remains predominantly the face, however the left ventricle system is decompressed, no entrapment on today's examination. Patchy hypodensity within the left mesial parietal lobe, axial 20/ 33 and evolving the gray and white matter.  Slight re-expansion of supercerebellar cistern, with persistent effacement of the quadrigeminal/ambient cistern. Very mild right uncal herniation is similar.  Mild calcific atherosclerosis of the carotid siphons. Visualized paranasal sinuses remain well aerated. Included ocular globes and orbital contents are nonsuspicious.  IMPRESSION: Status post interval right frontal parietal craniotomy for debulking/ resection of known right temporal parietal mass, with expected postoperative changes including minimal air and blood within the resection cavity. Small amount of extra-axial pneumocephalus. Overall less mass effect with residual 10 mm right to left midline  shift (improved), without hydrocephalus or ventricular entrapment on today's study.  Focal loss of the gray-white matter junction involving the mesial left parietal lobe concerning for acute ischemia.  Stable appearance of 22 x 19 mm right frontal lobe mass, however there is a subcentimeter focus of apparent blood along the inferior margin of the mass which appears new.   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 Sagittal and coronal MPR images reconstructed from axial data set.  CONTRAST:  133mL OMNIPAQUE IOHEXOL 300 MG/ML  SOLN  COMPARISON:  None  FINDINGS: CT CHEST FINDINGS  Tip of endotracheal tube 3.3 cm above carinal.  Nasogastric tube extends into stomach.  Scattered aortic and coronary arterial calcifications.  Four low-attenuation likely necrotic enlarged subcarinal of lymph nodes identified, measuring 2.5 cm, 2.6 cm, 2.2 cm and 2.2 cm in short axes.  No additional thoracic adenopathy.  Scattered subcutaneous and soft tissue edema.  Vascular structures grossly patent on nondedicated exam.  Lobulated left lower lobe mass 2.0 x 1.7 cm image 42, approximately 3 cm length.  Mild atelectasis in medial left lower lobe.  Remaining lungs clear.  No pleural effusion, pneumothorax, or additional mass/nodule.  No acute osseous findings.  CT ABDOMEN AND PELVIS FINDINGS  Liver, spleen, pancreas, kidneys, and adrenal glands normal appearance.  Distended gallbladder, otherwise unremarkable.  Stomach decompressed with suboptimal assessment of gastric wall thickness.  Normal appendix.  Air  and Foley catheter within urinary bladder.  Small amount of nonspecific presacral soft tissue edema.  Bowel loops otherwise unremarkable.  Few normal-sized retroperitoneal lymph nodes seen.  Scattered atherosclerotic calcification.  No mass, adenopathy, or ascites.  Unremarkable uterus and adnexae.  No acute osseous findings.  IMPRESSION: Lobulated left lower lobe mass 2.0 x 1.7 cm, approximately 3 cm length, consistent with tumor.  Subcarinal adenopathy, low attenuation question necrotic, largest node 2.6 cm short axis.  No intra-abdominal or intrapelvic metastatic disease identified.  Subsegmental atelectasis left lower lobe.   Electronically Signed   By: Lavonia Dana M.D.   On: 11/14/2013 17:00   Mr Jeri Cos FU Contrast  11/12/2013   CLINICAL DATA:  Syncopal episode, intracranial masses.  EXAM: MRI HEAD WITHOUT AND WITH CONTRAST  TECHNIQUE: Multiplanar, multiecho pulse sequences of the brain and surrounding structures were obtained without and with intravenous contrast for surgical planning.  CONTRAST:  26mL MULTIHANCE GADOBENATE DIMEGLUMINE 529 MG/ML IV SOLN  COMPARISON:  CT HEAD W/O CM dated 11/11/2013; CT HEAD W/O CM dated 12/15/2009  FINDINGS: Moderately motion degraded examination, per technologist patient was unable to remain still for the examination.  Heterogeneous may enhancing right temporal parietal occipital at least 4.4 x 6 cm (transverse by AP) mass with surrounding T2 FLAIR hyperintense vasogenic edema and possibly nonenhancing tumor. T2 bright signal along the temporal stem extending into the internal capsule, external capsule on the right. Effacement of the right temporal horn, occipital horn with left lateral ventricle/atrial enlargement concerning for entrapment with FLAIR hyperintense probable transependymal flow cerebral spinal fluid. 10 mm of right-to-left midline shift.  Similar characteristic 2.3 x 2.3 cm mass in high right frontal lobe, the gray-white matter junction. Extensor surrounding  FLAIR hyperintense signal.  Additional scattered subcentimeter supratentorial white matter lesions, with no definite enhancement. No extra-axial masses no abnormal leptomeningeal enhancement. Effacement of the right quadrigeminal/ambient cistern deforming the mid brain. Mildly effaced suprasellar cistern.  IMPRESSION: Moderately motion degraded limited MRI of the brain for surgical planning.  Heterogeneously enhancing 4.4 x 6 cm right temporal parietal occipital mass with extensive surrounding presumed vasogenic edema, though a component infiltrated nonenhancing tumor not excluded. In addition, similar appearing high right frontal lobe 2.3 x 2.3. Constellation of findings likely reflect metastatic disease, less likely lymphoma or possibly primary brain tumors.  10 mm of right-to-left midline shift with left ventricle entrapment/hydrocephalus. Effaced basal cisterns.  Additional subcentimeter white matter lesions favor chronic small vessel ischemic disease, incompletely characterized on this motion degraded limited MRI.   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 Sagittal and coronal MPR images reconstructed from axial data set.  CONTRAST:  165mL OMNIPAQUE IOHEXOL 300 MG/ML  SOLN  COMPARISON:  None  FINDINGS: CT CHEST FINDINGS  Tip of endotracheal tube 3.3 cm above carinal.  Nasogastric tube extends into stomach.  Scattered aortic and coronary arterial calcifications.  Four low-attenuation likely necrotic enlarged subcarinal of lymph nodes identified, measuring 2.5 cm, 2.6  cm, 2.2 cm and 2.2 cm in short axes.  No additional thoracic adenopathy.  Scattered subcutaneous and soft tissue edema.  Vascular structures grossly patent on nondedicated exam.  Lobulated left lower lobe mass 2.0 x 1.7 cm image 42, approximately 3 cm length.  Mild atelectasis in medial left lower lobe.  Remaining lungs clear.  No pleural effusion, pneumothorax, or additional mass/nodule.  No acute osseous findings.  CT ABDOMEN AND PELVIS FINDINGS  Liver, spleen, pancreas, kidneys, and adrenal glands normal appearance.  Distended gallbladder, otherwise  unremarkable.  Stomach decompressed with suboptimal assessment of gastric wall thickness.  Normal appendix.  Air and Foley catheter within urinary bladder.  Small amount of nonspecific presacral soft tissue edema.  Bowel loops otherwise unremarkable.  Few normal-sized retroperitoneal lymph nodes seen.  Scattered atherosclerotic calcification.  No mass, adenopathy, or ascites.  Unremarkable uterus and adnexae.  No acute osseous findings.  IMPRESSION: Lobulated left lower lobe mass 2.0 x 1.7 cm, approximately 3 cm length, consistent with tumor.  Subcarinal adenopathy, low attenuation question necrotic, largest node 2.6 cm short axis.  No intra-abdominal or intrapelvic metastatic disease identified.  Subsegmental atelectasis left lower lobe.   Electronically Signed   By: Lavonia Dana M.D.   On: 11/14/2013 17:00   Dg Chest Port 1 View  11/16/2013   CLINICAL DATA:  Evaluate left lung base atelectasis.  EXAM: PORTABLE CHEST - 1 VIEW  COMPARISON:  DG CHEST 1V PORT dated 11/15/2013  FINDINGS: The densities at the left lung base have nearly resolved. No focal lung densities or airspace disease. Nasogastric tube extends into the upper abdomen. There is contrast within the bowel just underneath the left hemidiaphragm. Heart size is stable.  IMPRESSION: The left basilar lung densities have nearly resolved.   Electronically Signed   By: Markus Daft M.D.   On: 11/16/2013 08:38    Dg Chest Port 1 View  11/15/2013   CLINICAL DATA:  Intubation.  EXAM: PORTABLE CHEST - 1 VIEW  COMPARISON:  CT CHEST W/CM dated 11/14/2013; DG CHEST 1V PORT dated 11/14/2013  FINDINGS: Endotracheal tube and NG tube in stable position. Left lower lobe infiltrate noted consistent with pneumonia. No pleural effusion or pneumothorax. Heart size and pulmonary vascularity normal.  IMPRESSION: 1. Endotracheal tube and NG tube in good anatomic position. 2. Left lower lobe infiltrate consistent with pneumonia.   Electronically Signed   By: Marcello Moores  Register   On: 11/15/2013 08:04   Dg Chest Port 1 View  11/14/2013   CLINICAL DATA:  Endotracheal tube evaluation  EXAM: PORTABLE CHEST - 1 VIEW  COMPARISON:  DG CHEST 1V PORT dated 11/13/2013  FINDINGS: Hyperaeration. Tubular device is stable. Minimal patchy density at the left base stable. Right lung is clear. No pneumothorax.  IMPRESSION: Stable minimal patchy density at the left base.   Electronically Signed   By: Maryclare Bean M.D.   On: 11/14/2013 07:52   Dg Chest Port 1 View  11/13/2013   CLINICAL DATA:  Evaluate endotracheal tube position.  EXAM: PORTABLE CHEST - 1 VIEW  COMPARISON:  DG CHEST 1V PORT dated 11/12/2013; DG CHEST 1V PORT dated 11/11/2013; DG CHEST 2V dated 12/15/2009  FINDINGS: Endotracheal tube is roughly 3.1 cm above the carina. Slightly increased densities in the retrocardiac space probably represent atelectasis or vascular crowding. Again noted is underlying hyperinflation. No evidence for a pneumothorax. Stable appearance of the heart and mediastinum. Heart size is normal. Nasogastric tube extends into the abdomen.  IMPRESSION: Endotracheal tube is appropriately positioned.  Questionable retrocardiac density could represent focal atelectasis. Recommend attention on follow-up imaging.   Electronically Signed   By: Markus Daft M.D.   On: 11/13/2013 08:09   Dg Chest Port 1 View  11/12/2013   CLINICAL DATA:  Metastatic brain tumor.  Craniotomy yesterday.   EXAM: PORTABLE CHEST - 1 VIEW  COMPARISON:  11/11/2013  FINDINGS: Endotracheal tube and the NG tube appear in good position.  Lungs appear clear.  No effusions.  No acute osseous abnormality.  IMPRESSION: Clear lungs.  Electronically Signed   By: Rozetta Nunnery M.D.   On: 11/12/2013 10:22   Dg Abd Portable 1v  11/17/2013   CLINICAL DATA:  NG tube placement.  EXAM: PORTABLE ABDOMEN - 1 VIEW  COMPARISON:  11/12/2013  FINDINGS: NG tube remains in the mid portion of the stomach. Gas and contrast material noted throughout the colon. No evidence of small bowel distention/obstruction. No free air. No organomegaly. No acute bony abnormality.  IMPRESSION: No evidence of bowel obstruction.  NG tube tip in the mid portion of the stomach.   Electronically Signed   By: Rolm Baptise M.D.   On: 11/17/2013 08:26   Dg Abd Portable 1v  11/12/2013   CLINICAL DATA:  Nasogastric tube placement.  EXAM: PORTABLE ABDOMEN - 1 VIEW  COMPARISON:  No priors.  FINDINGS: Single view of the abdomen demonstrates a nasogastric tube with tip in the body of the stomach and side port near the gastroesophageal junction. Visualized bowel gas pattern is nonobstructive.  IMPRESSION: 1. Tip of nasogastric tube is in the body of the stomach.   Electronically Signed   By: Vinnie Langton M.D.   On: 11/12/2013 05:05    Scheduled Meds: . antiseptic oral rinse  15 mL Mouth Rinse QID  . dexamethasone  10 mg Intravenous 4 times per day  . feeding supplement (PIVOT 1.5 CAL)  1,000 mL Per Tube Q24H  . insulin aspart  0-15 Units Subcutaneous 6 times per day  . levETIRAcetam  500 mg Per Tube BID  . multivitamin  5 mL Per Tube Daily  . ranitidine  150 mg Per Tube BID   Continuous Infusions: . sodium chloride 20 mL (11/17/13 1323)    Time spent: 35 minutes. The patient requires high complexity decision making.   LOS: 7 days   [facility] Pager [phone]. If unable to reach me by pager, please call my cell phone at  [phone].  *Please note that the hospitalists switch teams on Wednesdays. Please call the flow manager at [phone] if you are having difficulty reaching the hospitalist taking care of this patient as she can update you and provide the most up-to-date pager number of provider caring for the patient. If 8PM-8AM, please contact night-coverage at [email or web address], password [facility]  11/18/2013, 7:23 AM    **Disclaimer: This note was dictated with voice recognition software. Similar sounding words can inadvertently be transcribed and this note may contain transcription errors which may not have been corrected upon publication of note.**

## 2013-11-18 NOTE — Progress Notes (Signed)
Physical Therapy Treatment Patient Details Name: Katie [name] MRN: [id number] DOB: 1950/09/03 Today's Date: 11/18/2013    History of Present Illness 63 y.o. F s/p R parietal occipital craniotomy and resection of brain tumor 3/24    PT Comments    Pt responds to "Beth".  Easily aroused, slow and groggy.  + 2 assist transferred pt from supine to EOB.  Pt sat EOB x 10 min while performing static and dynamic sitting balance activities. Pt required total assist + 1 on her L for support as she performed cross midline reaching for obstacle (red flashlight) using PNF patterns D1 and D2.  Required AAROM B UE's and trunk stability to prevent LOB in all planes.   Assisted pt OOB to recliner performing stand pivot 1/4 turn sit "bear hug".  Pt was able stand static x 1 min but required MAX trunk support and block hips/knees from buckling.  Carefully performed 1/4 turn and positioned in recliner.  Multiple pillows to achieve midline trunk and pressure relief.  B wrist restraints applied as pt tries to pull on her NG tube.  Pt followed commands well.  Slow to respond.   Pt would greatly benefit from CIR program.  Follow Up Recommendations  CIR     Equipment Recommendations       Recommendations for Other Services       Precautions / Restrictions Precautions Precautions: Fall Restrictions Weight Bearing Restrictions: No    Mobility  Bed Mobility Overal bed mobility: Needs Assistance;+2 for physical assistance Bed Mobility: Supine to Sit     Supine to sit: +2 for physical assistance;Max assist (pt 10%)     General bed mobility comments: Initiating with B UE's and hips pt 10% to roll and scoot to EOB.  Total assist for B LU's.  Once upright EOB pt required total assist for upright posture as pt demon severe posterior/Left lean and poor trunk control to self maintain.  Pt sta EOB while performing static and dynamic trunk acitivities that included PNF patterns and cross midline reaching for obsticle  (red flashlight).  Pt tolerated EOB activity x 10 min.  Transfers Overall transfer level: Needs assistance Equipment used: None Transfers: Stand Pivot Transfers (Bear Hug) Sit to Stand: Total assist;+2 physical assistance         General transfer comment: Total assist + 1 stand pivot sit "Bear Hug" 1/4 turn from bed to recliner while blocking knees.  Pt was able to stand briefly and support herself but required total assist to pivot and turn.    Ambulation/Gait                 Stairs            Wheelchair Mobility    Modified Rankin (Stroke Patients Only)       Balance                                    Cognition                            Exercises      General Comments        Pertinent Vitals/Pain     Home Living                      Prior Function  PT Goals (current goals can now be found in the care plan section) Progress towards PT goals: Progressing toward goals    Frequency  Min 4X/week    PT Plan      End of Session Equipment Utilized During Treatment: Gait belt Activity Tolerance: Patient limited by fatigue Patient left: in chair;with family/visitor present;with call bell/phone within reach;with restraints reapplied     Time: 0910-0952 PT Time Calculation (min): 42 min  Charges:  $Gait Training: 8-22 mins $Therapeutic Activity: 23-37 mins                    G Codes:      Rica Koyanagi  PTA [facility]  Acute  Rehab Pager      [phone]

## 2013-11-19 ENCOUNTER — Inpatient Hospital Stay (HOSPITAL_COMMUNITY): Payer: BC Managed Care – PPO

## 2013-11-19 DIAGNOSIS — E440 Moderate protein-calorie malnutrition: Secondary | ICD-10-CM | POA: Insufficient documentation

## 2013-11-19 DIAGNOSIS — I10 Essential (primary) hypertension: Secondary | ICD-10-CM

## 2013-11-19 LAB — BASIC METABOLIC PANEL
BUN: 34 mg/dL — AB (ref 6–23)
CALCIUM: 8.7 mg/dL (ref 8.4–10.5)
CO2: 27 mEq/L (ref 19–32)
CREATININE: 0.46 mg/dL — AB (ref 0.50–1.10)
Chloride: 99 mEq/L (ref 96–112)
Glucose, Bld: 174 mg/dL — ABNORMAL HIGH (ref 70–99)
Potassium: 4.4 mEq/L (ref 3.7–5.3)
Sodium: 136 mEq/L — ABNORMAL LOW (ref 137–147)

## 2013-11-19 LAB — BLOOD GAS, ARTERIAL
Acid-Base Excess: 4.8 mmol/L — ABNORMAL HIGH (ref 0.0–2.0)
Bicarbonate: 28 mEq/L — ABNORMAL HIGH (ref 20.0–24.0)
Drawn by: 365271
FIO2: 0.45 %
O2 Saturation: 97.9 %
PATIENT TEMPERATURE: 37
TCO2: 24.8 mmol/L (ref 0–100)
pCO2 arterial: 37.9 mmHg (ref 35.0–45.0)
pH, Arterial: 7.482 — ABNORMAL HIGH (ref 7.350–7.450)
pO2, Arterial: 100 mmHg (ref 80.0–100.0)

## 2013-11-19 LAB — CBC
HCT: 37 % (ref 36.0–46.0)
Hemoglobin: 12.6 g/dL (ref 12.0–15.0)
MCH: 31.8 pg (ref 26.0–34.0)
MCHC: 34.1 g/dL (ref 30.0–36.0)
MCV: 93.4 fL (ref 78.0–100.0)
PLATELETS: 272 10*3/uL (ref 150–400)
RBC: 3.96 MIL/uL (ref 3.87–5.11)
RDW: 12.8 % (ref 11.5–15.5)
WBC: 18.5 10*3/uL — AB (ref 4.0–10.5)

## 2013-11-19 LAB — GLUCOSE, CAPILLARY
GLUCOSE-CAPILLARY: 162 mg/dL — AB (ref 70–99)
GLUCOSE-CAPILLARY: 155 mg/dL — AB (ref 70–99)
GLUCOSE-CAPILLARY: 142 mg/dL — AB (ref 70–99)
Glucose-Capillary: 135 mg/dL — ABNORMAL HIGH (ref 70–99)
Glucose-Capillary: 152 mg/dL — ABNORMAL HIGH (ref 70–99)

## 2013-11-19 LAB — LACTATE DEHYDROGENASE: LDH: 371 U/L — AB (ref 94–250)

## 2013-11-19 MED ORDER — PIPERACILLIN-TAZOBACTAM 3.375 G IVPB
3.3750 g | Freq: Three times a day (TID) | INTRAVENOUS | Status: DC
  Administered 2013-11-19 – 2013-11-28 (×27): 3.375 g via INTRAVENOUS
  Filled 2013-11-19 (×31): qty 50

## 2013-11-19 MED ORDER — IPRATROPIUM-ALBUTEROL 0.5-2.5 (3) MG/3ML IN SOLN: 3.0000 mL | RESPIRATORY_TRACT | Status: DC | PRN

## 2013-11-19 MED ORDER — VITAMINS A & D EX OINT
TOPICAL_OINTMENT | CUTANEOUS | Status: AC
  Administered 2013-11-19: 12:00:00
  Filled 2013-11-19: qty 15

## 2013-11-19 MED ORDER — CHLORHEXIDINE GLUCONATE 0.12 % MT SOLN
15.0000 mL | Freq: Two times a day (BID) | OROMUCOSAL | Status: DC
  Administered 2013-11-20 – 2013-11-24 (×10): 15 mL via OROMUCOSAL
  Filled 2013-11-19 (×12): qty 15

## 2013-11-19 MED ORDER — IPRATROPIUM-ALBUTEROL 0.5-2.5 (3) MG/3ML IN SOLN
3.0000 mL | Freq: Four times a day (QID) | RESPIRATORY_TRACT | Status: DC
  Administered 2013-11-19: 3 mL via RESPIRATORY_TRACT
  Filled 2013-11-19: qty 3

## 2013-11-19 MED ORDER — VANCOMYCIN HCL IN DEXTROSE 750-5 MG/150ML-% IV SOLN
750.0000 mg | Freq: Two times a day (BID) | INTRAVENOUS | Status: DC
  Administered 2013-11-20 – 2013-11-22 (×6): 750 mg via INTRAVENOUS
  Filled 2013-11-19 (×11): qty 150

## 2013-11-19 MED ORDER — VANCOMYCIN HCL 10 G IV SOLR
1250.0000 mg | Freq: Once | INTRAVENOUS | Status: AC
  Administered 2013-11-19: 1250 mg via INTRAVENOUS
  Filled 2013-11-19: qty 1250

## 2013-11-19 NOTE — Progress Notes (Signed)
Pt lethargic and unable to perform NIF or VC at this time.

## 2013-11-19 NOTE — Progress Notes (Signed)
SLP Cancellation Note  Patient Details Name: Katie [name] MRN: [id number] DOB: 02-14-1951   Cancelled treatment:       Reason Eval/Treat Not Completed: Medical issues which prohibited therapy (pt with respiratory issues overnight, desatting now on facemask oxygen and lethargic)  Will continue to follow for po readiness.     Luanna Salk, [place] [facility] SLP [phone]

## 2013-11-19 NOTE — Progress Notes (Signed)
PROGRESS NOTE   Katie [name] QQI:[id number] DOB: 07/25/1951 DOA: 11/11/2013 PCP: Ezequiel Kayser, MD  Brief narrative: Katie [name] is an 63 y.o. female with a PMH of tobacco abuse and hyperlipidemia who was admitted by neurosurgery on 11/11/13 after suffering a syncopal episode resulting in head trauma. A CT scan of the brain, done on admission, demonstrated 2 large right-sided lesions in the frontal and parietal/occipital area with 12 mm of shift and subfalcine herniation.  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 Ellene Route of neurosurgery and underwent a right parietal/occipital craniotomy with resection of brain tumor on 11/12/13. Pathologic diagnosis from the frozen section was obtained and showed malignant carcinoma. The patient was intubated postoperatively/successfully extubated 11/15/1498 the direction of pulmonology. Tube feeding is being provided through a NG tube. Patient underwent followup CT scanning of the brain on 11/13/13 which showed less shift and mass effect with no significant bleeding and tumor bed. She continues on Keppra and Decadron, with recommendations to slowly taper dexamethasone down to 2 mg twice a day.  Unfortunately, too restless to begin XRT. Active Problems:   Underweight / Severe protein calorie malnutrition / Malignancy associated cachexia BMI is 18.7. She is receiving tube feeding. Family reports significant weight loss since November (15 lbs).  Likely from malignancy associated cachexia.   Acute respiratory failure Resolved.  Extubated 11/15/13 after being intubated x 3 days. Had an episode of hypoxia necessitating increased concentration of oxygen overnight. We'll check a chest x-ray and an ABG. WBC up, concern for the development of HCAP.  We'll start duo nebs.   Altered mental status Secondary to brain metastasis and major surgery. Continue wrist restraints for safety.   Small cell lung cancer Dr. Marin Olp  saw the patient in consultation on 11/15/13.  CT scan of the chest and abdomen revealed a left lower lobe nodule and significant mediastinal adenopathy.  Whole brain radiation was recommended, and therefore she was transferred to [facility]. She'll also need chemotherapy.   HTN (hypertension) Being managed with when necessary labetalol.   Steroid-induced hyperglycemia Continue SSI for hyperglycemia. CBGs 135-162.   DVT Prophylaxis Continue SCDs.  Code Status: Full. Family Communication: Family updated at bedside. Disposition Plan: CIR   IV access:  Peripheral IV  Medical Consultants:  Dr. Eppie Gibson, Radiation Oncology  Dr. Burney Gauze, Oncology  Dr. Kristeen Miss, Neurosurgery  Other Consultants:  Physical therapy  Speech therapy  Dietitian  Anti-infectives:  None  HPI/Subjective: Katie [name] remains lethargic and nonverbal. She continues to require wrist restraints for safety. Had an episode of oxygen desaturation overnight, and now has a facemask on for oxygen delivery. Continues to have periods of restlessness.  Objective: Filed Vitals:   11/19/13 0200 11/19/13 0400 11/19/13 0500 11/19/13 0600  BP: 131/78 124/75  137/78  Pulse: 66 72  65  Temp:  98.2 F (36.8 C)    TempSrc:  Axillary    Resp: 15 16  15   Height:      Weight:   53.9 kg (118 lb 13.3 oz)   SpO2: 100% 100%  99%    Intake/Output Summary (Last 24 hours) at 11/19/13 0724 Last data filed at 11/19/13 0543  Gross per 24 hour  Intake   1485 ml  Output   1145 ml  Net    340 ml    Exam: Gen:  NAD, lethargic with psychomotor restlessness. Cardiovascular:  RRR, No M/R/G Respiratory:  Lungs with scattered rhonchi, coarse Gastrointestinal:  Abdomen  soft, NT/ND, + BS Extremities:  No C/E/C  Data Reviewed: Basic Metabolic Panel:  Recent Labs Lab 11/13/13 0500 11/14/13 0500 11/15/13 0316 11/16/13 0345 11/17/13 0256 11/19/13 0320  NA 143 143 142 140 138 136*  K 4.3 4.2 4.3 3.8 4.4 4.4  CL 108  107 103 100 99 99  CO2 24 26 27 28 27 27   GLUCOSE 157* 159* 123* 171* 152* 174*  BUN 31* 34* 30* 26* 29* 34*  CREATININE 0.64 0.48* 0.51 0.48* 0.46* 0.46*  CALCIUM 8.5 8.2* 8.4 8.6 8.8 8.7  MG 2.1  --   --   --   --   --   PHOS 3.4  --   --   --   --   --    GFR Estimated Creatinine Clearance: 60.5 ml/min (by C-G formula based on Cr of 0.46). Liver Function Tests:  Recent Labs Lab 11/14/13 0500  AST 22  ALT 17  ALKPHOS 20*  BILITOT 0.3  PROT 4.6*  ALBUMIN 2.5*   CBC:  Recent Labs Lab 11/12/13 0913 11/13/13 0500 11/14/13 0500 11/15/13 0316 11/19/13 0320  WBC  --  11.1* 12.6* 13.3* 18.5*  NEUTROABS  --   --  11.3* 11.9*  --   HGB 9.2* 10.7* 10.4* 10.7* 12.6  HCT 27.0* 31.2* 30.2* 31.4* 37.0  MCV  --  94.3 94.4 94.3 93.4  PLT  --  178 181 193 272   CBG:  Recent Labs Lab 11/18/13 1137 11/18/13 1544 11/18/13 1950 11/18/13 2314 11/19/13 0333  GLUCAP 144* 145* 135* 145* 162*   Lipid Profile  Recent Labs  11/17/13 0256  TRIG 66   Microbiology Recent Results (from the past 240 hour(s))  MRSA PCR SCREENING     Status: None   Collection Time    11/11/13  9:02 PM      Result Value Ref Range Status   MRSA by PCR NEGATIVE  NEGATIVE Final   Comment:            The GeneXpert MRSA Assay (FDA     approved for NASAL specimens     only), is one component of a     comprehensive MRSA colonization     surveillance program. It is not     intended to diagnose MRSA     infection nor to guide or     monitor treatment for     MRSA infections.     Procedures and Diagnostic Studies: Dg Abd 1 View  11/18/2013   CLINICAL DATA:  NG tube replaced for feeding.  EXAM: ABDOMEN - 1 VIEW  COMPARISON:  KUB from yesterday  FINDINGS: There is a nasogastric tube with tip terminating in the proximal stomach. The side port is at the GE junction.  There is motion artifact, decreasing sensitivity. Contrast remains present throughout the colon, without evidence of obstruction. No gross  opacification of the lung bases.  IMPRESSION: Nasogastric tube enters the proximal stomach, with side port at the GE junction. Advancement by 5 cm would provide more secure positioning.   Electronically Signed   By: Jorje Guild M.D.   On: 11/18/2013 05:03   Ct Head Wo Contrast  11/13/2013   CLINICAL DATA:  Follow-up brain tumor resection.  EXAM: CT HEAD WITHOUT CONTRAST  TECHNIQUE: Contiguous axial images were obtained from the base of the skull through the vertex without intravenous contrast.  COMPARISON:  MR HEAD WO/W CM dated 11/11/2013; CT HEAD W/O CM dated 11/11/2013  FINDINGS: Status post interval  right frontal parietal craniotomy for debulking/ resection of right temporoparietal mass. Small amount of hyperdense blood products and air within the resection cavity. Small amount of extra-axial pneumocephalus mass seen.  No apparent change in mildly hyperdense right frontal lobe 22 x 19 mm mass, with a subcentimeter focus along the inferior margin of hyperdense possible blood products, axial 15/33. Right cerebral probable vasogenic edema, corresponding to MRI abnormality. 10 mm right to left midline shift, previously 12 mm. The right lateral ventricle remains predominantly the face, however the left ventricle system is decompressed, no entrapment on today's examination. Patchy hypodensity within the left mesial parietal lobe, axial 20/ 33 and evolving the gray and white matter.  Slight re-expansion of supercerebellar cistern, with persistent effacement of the quadrigeminal/ambient cistern. Very mild right uncal herniation is similar.  Mild calcific atherosclerosis of the carotid siphons. Visualized paranasal sinuses remain well aerated. Included ocular globes and orbital contents are nonsuspicious.  IMPRESSION: Status post interval right frontal parietal craniotomy for debulking/ resection of known right temporal parietal mass, with expected postoperative changes including minimal air and blood within the  resection cavity. Small amount of extra-axial pneumocephalus. Overall less mass effect with residual 10 mm right to left midline shift (improved), without hydrocephalus or ventricular entrapment on today's study.  Focal loss of the gray-white matter junction involving the mesial left parietal lobe concerning for acute ischemia.  Stable appearance of 22 x 19 mm right frontal lobe mass, however there is a subcentimeter focus of apparent blood along the inferior margin of the mass which appears new.   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 Sagittal and coronal MPR images reconstructed from axial data set.  CONTRAST:  130mL OMNIPAQUE IOHEXOL 300 MG/ML  SOLN  COMPARISON:  None  FINDINGS: CT CHEST FINDINGS  Tip of endotracheal tube 3.3 cm above carinal.  Nasogastric tube extends into stomach.  Scattered aortic and coronary arterial calcifications.  Four low-attenuation likely necrotic enlarged subcarinal of lymph nodes identified, measuring 2.5 cm, 2.6 cm, 2.2 cm and 2.2 cm in short axes.  No additional thoracic adenopathy.  Scattered subcutaneous and soft tissue edema.  Vascular structures grossly patent on nondedicated exam.  Lobulated left lower lobe mass 2.0 x 1.7 cm image 42, approximately 3 cm length.  Mild atelectasis in medial left lower lobe.  Remaining lungs clear.  No pleural effusion, pneumothorax, or additional mass/nodule.  No acute osseous findings.  CT ABDOMEN AND PELVIS FINDINGS  Liver, spleen, pancreas, kidneys, and adrenal glands normal appearance.  Distended  gallbladder, otherwise unremarkable.  Stomach decompressed with suboptimal assessment of gastric wall thickness.  Normal appendix.  Air and Foley catheter within urinary bladder.  Small amount of nonspecific presacral soft tissue edema.  Bowel loops otherwise unremarkable.  Few normal-sized retroperitoneal lymph nodes seen.  Scattered atherosclerotic calcification.  No mass, adenopathy, or ascites.  Unremarkable uterus and adnexae.  No acute osseous findings.  IMPRESSION: Lobulated left lower lobe mass 2.0 x 1.7 cm, approximately 3 cm length, consistent with tumor.  Subcarinal adenopathy, low attenuation question necrotic, largest node 2.6 cm short axis.  No intra-abdominal or intrapelvic  metastatic disease identified.  Subsegmental atelectasis left lower lobe.   Electronically Signed   By: Lavonia Dana M.D.   On: 11/14/2013 17:00   Mr Jeri Cos VH Contrast  11/12/2013   CLINICAL DATA:  Syncopal episode, intracranial masses.  EXAM: MRI HEAD WITHOUT AND WITH CONTRAST  TECHNIQUE: Multiplanar, multiecho pulse sequences of the brain and surrounding structures were obtained without and with intravenous contrast for surgical planning.  CONTRAST:  40mL MULTIHANCE GADOBENATE DIMEGLUMINE 529 MG/ML IV SOLN  COMPARISON:  CT HEAD W/O CM dated 11/11/2013; CT HEAD W/O CM dated 12/15/2009  FINDINGS: Moderately motion degraded examination, per technologist patient was unable to remain still for the examination.  Heterogeneous may enhancing right temporal parietal occipital at least 4.4 x 6 cm (transverse by AP) mass with surrounding T2 FLAIR hyperintense vasogenic edema and possibly nonenhancing tumor. T2 bright signal along the temporal stem extending into the internal capsule, external capsule on the right. Effacement of the right temporal horn, occipital horn with left lateral ventricle/atrial enlargement concerning for entrapment with FLAIR hyperintense probable transependymal flow cerebral spinal fluid. 10 mm of right-to-left  midline shift.  Similar characteristic 2.3 x 2.3 cm mass in high right frontal lobe, the gray-white matter junction. Extensor surrounding FLAIR hyperintense signal.  Additional scattered subcentimeter supratentorial white matter lesions, with no definite enhancement. No extra-axial masses no abnormal leptomeningeal enhancement. Effacement of the right quadrigeminal/ambient cistern deforming the mid brain. Mildly effaced suprasellar cistern.  IMPRESSION: Moderately motion degraded limited MRI of the brain for surgical planning.  Heterogeneously enhancing 4.4 x 6 cm right temporal parietal occipital mass with extensive surrounding presumed vasogenic edema, though a component infiltrated nonenhancing tumor not excluded. In addition, similar appearing high right frontal lobe 2.3 x 2.3. Constellation of findings likely reflect metastatic disease, less likely lymphoma or possibly primary brain tumors.  10 mm of right-to-left midline shift with left ventricle entrapment/hydrocephalus. Effaced basal cisterns.  Additional subcentimeter white matter lesions favor chronic small vessel ischemic disease, incompletely characterized on this motion degraded limited MRI.   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 Sagittal and coronal MPR images reconstructed from axial data set.  CONTRAST:  131mL OMNIPAQUE IOHEXOL 300 MG/ML  SOLN  COMPARISON:  None  FINDINGS: CT CHEST FINDINGS  Tip of endotracheal tube 3.3 cm above carinal.  Nasogastric tube extends into stomach.  Scattered aortic and  coronary arterial calcifications.  Four low-attenuation likely necrotic enlarged subcarinal of lymph nodes identified, measuring 2.5 cm, 2.6 cm, 2.2 cm and 2.2 cm in short axes.  No additional thoracic adenopathy.  Scattered subcutaneous and soft tissue edema.  Vascular structures grossly patent on nondedicated exam.  Lobulated left lower lobe mass 2.0 x 1.7 cm image 42, approximately 3 cm length.  Mild atelectasis in medial left lower lobe.  Remaining lungs clear.  No pleural effusion, pneumothorax, or additional mass/nodule.  No acute osseous findings.  CT ABDOMEN AND PELVIS FINDINGS  Liver, spleen, pancreas, kidneys, and adrenal glands normal appearance.  Distended gallbladder, otherwise unremarkable.  Stomach decompressed with suboptimal assessment of gastric wall thickness.  Normal appendix.  Air and  Foley catheter within urinary bladder.  Small amount of nonspecific presacral soft tissue edema.  Bowel loops otherwise unremarkable.  Few normal-sized retroperitoneal lymph nodes seen.  Scattered atherosclerotic calcification.  No mass, adenopathy, or ascites.  Unremarkable uterus and adnexae.  No acute osseous findings.  IMPRESSION: Lobulated left lower lobe mass 2.0 x 1.7 cm, approximately 3 cm length, consistent with tumor.  Subcarinal adenopathy, low attenuation question necrotic, largest node 2.6 cm short axis.  No intra-abdominal or intrapelvic metastatic disease identified.  Subsegmental atelectasis left lower lobe.   Electronically Signed   By: Lavonia Dana M.D.   On: 11/14/2013 17:00   Dg Chest Port 1 View  11/16/2013   CLINICAL DATA:  Evaluate left lung base atelectasis.  EXAM: PORTABLE CHEST - 1 VIEW  COMPARISON:  DG CHEST 1V PORT dated 11/15/2013  FINDINGS: The densities at the left lung base have nearly resolved. No focal lung densities or airspace disease. Nasogastric tube extends into the upper abdomen. There is contrast within the bowel just underneath the left hemidiaphragm. Heart size is stable.   IMPRESSION: The left basilar lung densities have nearly resolved.   Electronically Signed   By: Markus Daft M.D.   On: 11/16/2013 08:38   Dg Chest Port 1 View  11/15/2013   CLINICAL DATA:  Intubation.  EXAM: PORTABLE CHEST - 1 VIEW  COMPARISON:  CT CHEST W/CM dated 11/14/2013; DG CHEST 1V PORT dated 11/14/2013  FINDINGS: Endotracheal tube and NG tube in stable position. Left lower lobe infiltrate noted consistent with pneumonia. No pleural effusion or pneumothorax. Heart size and pulmonary vascularity normal.  IMPRESSION: 1. Endotracheal tube and NG tube in good anatomic position. 2. Left lower lobe infiltrate consistent with pneumonia.   Electronically Signed   By: Marcello Moores  Register   On: 11/15/2013 08:04   Dg Chest Port 1 View  11/14/2013   CLINICAL DATA:  Endotracheal tube evaluation  EXAM: PORTABLE CHEST - 1 VIEW  COMPARISON:  DG CHEST 1V PORT dated 11/13/2013  FINDINGS: Hyperaeration. Tubular device is stable. Minimal patchy density at the left base stable. Right lung is clear. No pneumothorax.  IMPRESSION: Stable minimal patchy density at the left base.   Electronically Signed   By: Maryclare Bean M.D.   On: 11/14/2013 07:52   Dg Chest Port 1 View  11/13/2013   CLINICAL DATA:  Evaluate endotracheal tube position.  EXAM: PORTABLE CHEST - 1 VIEW  COMPARISON:  DG CHEST 1V PORT dated 11/12/2013; DG CHEST 1V PORT dated 11/11/2013; DG CHEST 2V dated 12/15/2009  FINDINGS: Endotracheal tube is roughly 3.1 cm above the carina. Slightly increased densities in the retrocardiac space probably represent atelectasis or vascular crowding. Again noted is underlying hyperinflation. No evidence for a pneumothorax. Stable appearance of the heart and mediastinum. Heart size is normal. Nasogastric tube extends into the abdomen.  IMPRESSION: Endotracheal tube is appropriately positioned.  Questionable retrocardiac density could represent focal atelectasis. Recommend attention on follow-up imaging.   Electronically Signed   By: Markus Daft M.D.   On: 11/13/2013 08:09   Dg Chest Port 1 View  11/12/2013   CLINICAL DATA:  Metastatic brain tumor.  Craniotomy yesterday.  EXAM: PORTABLE CHEST - 1 VIEW  COMPARISON:  11/11/2013  FINDINGS: Endotracheal tube and the NG tube appear in good position.  Lungs appear clear.  No effusions.  No acute osseous abnormality.  IMPRESSION: Clear lungs.   Electronically Signed   By: Rozetta Nunnery M.D.   On: 11/12/2013 10:22  Dg Abd Portable 1v  11/17/2013   CLINICAL DATA:  NG tube placement.  EXAM: PORTABLE ABDOMEN - 1 VIEW  COMPARISON:  11/12/2013  FINDINGS: NG tube remains in the mid portion of the stomach. Gas and contrast material noted throughout the colon. No evidence of small bowel distention/obstruction. No free air. No organomegaly. No acute bony abnormality.  IMPRESSION: No evidence of bowel obstruction.  NG tube tip in the mid portion of the stomach.   Electronically Signed   By: Rolm Baptise M.D.   On: 11/17/2013 08:26   Dg Abd Portable 1v  11/12/2013   CLINICAL DATA:  Nasogastric tube placement.  EXAM: PORTABLE ABDOMEN - 1 VIEW  COMPARISON:  No priors.  FINDINGS: Single view of the abdomen demonstrates a nasogastric tube with tip in the body of the stomach and side port near the gastroesophageal junction. Visualized bowel gas pattern is nonobstructive.  IMPRESSION: 1. Tip of nasogastric tube is in the body of the stomach.   Electronically Signed   By: Vinnie Langton M.D.   On: 11/12/2013 05:05    Scheduled Meds: . antiseptic oral rinse  15 mL Mouth Rinse QID  . dexamethasone  10 mg Intravenous 4 times per day  . insulin aspart  0-15 Units Subcutaneous 6 times per day  . levETIRAcetam  500 mg Per Tube BID  . multivitamin  5 mL Per Tube Daily  . ranitidine  150 mg Per Tube BID   Continuous Infusions: . sodium chloride 20 mL (11/17/13 1323)  . feeding supplement (PIVOT 1.5 CAL) 1,000 mL (11/18/13 1600)    Time spent: 35 minutes. The patient requires high complexity decision  making.   LOS: 8 days   [facility] Pager [phone]. If unable to reach me by pager, please call my cell phone at [phone].  *Please note that the hospitalists switch teams on Wednesdays. Please call the flow manager at [phone] if you are having difficulty reaching the hospitalist taking care of this patient as she can update you and provide the most up-to-date pager number of provider caring for the patient. If 8PM-8AM, please contact night-coverage at [email or web address], password [facility]  11/19/2013, 7:24 AM    **Disclaimer: This note was dictated with voice recognition software. Similar sounding words can inadvertently be transcribed and this note may contain transcription errors which may not have been corrected upon publication of note.**

## 2013-11-19 NOTE — Progress Notes (Signed)
Patient VC 1.2 L  Good effort NIF -14, Patient has poor effort possibly due to confusion.

## 2013-11-19 NOTE — Progress Notes (Signed)
CARE MANAGEMENT NOTE 11/19/2013  Patient:  Katie [name], Katie [name]   Account Number:  [id number]  Date Initiated:  11/19/2013  Documentation initiated by:  DAVIS,RHONDA  Subjective/Objective Assessment:   patient with hx of brain ca and metas. now with resp desats, lethgary, and requiring o2 at 40-55% fi02 via venturi mask.     Action/Plan:   tbd/pt is a oncology patient/ no radiation or chemo at this time.  [facility] on [phone].   Anticipated DC Date:  11/22/2013   Anticipated DC Plan:  IP REHAB FACILITY  In-house referral  NA      DC Planning Services  NA      PAC Choice  NA   Choice offered to / List presented to:  NA   DME arranged  NA      DME agency  NA     [place] arranged  NA      [place] agency  NA   Status of service:  In process, will continue to follow Medicare Important Message given?  NA - LOS <3 / Initial given by admissions (If response is "NO", the following Medicare IM given date fields will be blank) Date Medicare IM given:   Date Additional Medicare IM given:    Discharge Disposition:    Per UR Regulation:  Reviewed for med. necessity/level of care/duration of stay  If discussed at [place] of Stay Meetings, dates discussed:    Comments:  [id number]/Rhonda Eldridge Dace, [place], [place] [id number] Chart Reviewed for discharge and hospital needs. Discharge needs at time of review: notes per Dr. Rockne Menghini states that CIR is the expected post hospital next step/but has not referred at this time due to condition and patient very lethgaric.  None present will follow for needs. Review of patient progress due on [id number].

## 2013-11-19 NOTE — Progress Notes (Signed)
ANTIBIOTIC CONSULT NOTE - INITIAL  Pharmacy Consult for Vancomycin / Zosyn Indication: HCAP  No Known Allergies  Patient Measurements: Height: 5\' 7"  (170.2 cm) Weight: 118 lb 13.3 oz (53.9 kg) IBW/kg (Calculated) : 61.6 Adjusted Body Weight:   Vital Signs: Temp: 98.4 F (36.9 C) (03/31 1200) Temp src: Axillary (03/31 1200) BP: 131/62 mmHg (03/31 1600) Pulse Rate: 73 (03/31 1600) Intake/Output from previous day: 03/30 0701 - 03/31 0700 In: 1485 [I.V.:440; NG/GT:1045] Out: 1145 [Urine:1145] Intake/Output from this shift: Total I/O In: 745 [I.V.:220; NG/GT:525] Out: 535 [Urine:535]  Labs:  Recent Labs  11/17/13 0256 11/19/13 0320  WBC  --  18.5*  HGB  --  12.6  PLT  --  272  CREATININE 0.46* 0.46*   Estimated Creatinine Clearance: 60.5 ml/min (by C-G formula based on Cr of 0.46). No results found for this basename: VANCOTROUGH, Corlis Leak, VANCORANDOM, [place], GENTPEAK, GENTRANDOM, TOBRATROUGH, TOBRAPEAK, TOBRARND, AMIKACINPEAK, AMIKACINTROU, AMIKACIN,  in the last 72 hours   Microbiology: Recent Results (from the past 720 hour(s))  MRSA PCR SCREENING     Status: None   Collection Time    11/11/13  9:02 PM      Result Value Ref Range Status   MRSA by PCR NEGATIVE  NEGATIVE Final   Comment:            The GeneXpert MRSA Assay (FDA     approved for NASAL specimens     only), is one component of a     comprehensive MRSA colonization     surveillance program. It is not     intended to diagnose MRSA     infection nor to guide or     monitor treatment for     MRSA infections.    Medical History: Past Medical History  Diagnosis Date  . Anxiety     takes lexapro   Assessment: 63 yoF admitted 3/23 after suffering syncopal episode  resulting in head trama, subsequently found to have Quinter with brain metastasis s/p brain tumor resection 3/24, currently at [facility] for possible chemotherapy and radiation.  Pt recently extubated 3/27 now with possible HCAP and  pharmacy consulted to start Vancomycin and Zosyn.   3/31/ >> Vancomycin  >> 3/31 >>  Zosyn  >>    Tmax: 98.4 WBCs: 18.5 Renal: SCr 0.46, CrCl 61, (N80 using rounded SCr 0.8)  Microbiology: 3/23 MRSA PCR negative  NKDA  Goal of Therapy:  Vancomycin trough level 15-20 mcg/ml  Plan:  Vancomycin 1250mg  IV x 1, then 750mg  IV q12h Zosyn 3.375g IV q8h (infuse over 4 hours) F/u Vancomycin trough at Css, clinical course  Ralene Bathe, PharmD, BCPS 11/19/2013, 5:05 PM  Pager: [phone]

## 2013-11-19 NOTE — Progress Notes (Signed)
SLP spoke to pt's spouse and informed him that SLP is continuing to follow [name].  Advised him to goal of pt having improved consistent LOA, strength of cough and phonation.   Pt currently lethargic and not appropriate for po.     Will continue to follow for po readiness and/or instrumental swallow evaluation.     Katie [name], [facility] SLP [phone]

## 2013-11-19 NOTE — Progress Notes (Signed)
Katie [name] is somewhat lethargic this morning. She apparently  de-saturated last night. She is on facemask oxygen. She does respond to sternal rub.  She still has fevers through a panda tube. These have been going okay.  There've been no seizures.  She is on a good amount of Decadron-10 mg IV 4 times a day.  Her LDH is 371. This is on the high side. This may indicate metastatic disease elsewhere although we cannot find it.  She probably is not able to do any radiation. I still think she is mentally alert to be a will to live still.  Her vital signs were okay. Blood pressure 137/78. She is a good oxygen just a trace of 9%. She is afebrile.  Her lungs are clear. There may be some crackles bilaterally. Cardiac exam regular in rhythm. Abdomen is soft. Bowel sounds are present.  She still is in the ICU. She continues to be in the ICU.  We will follow along closely. I don't see a emergent need to start chemotherapy right now. However, I do suspect that she binocular be able to do radiation. As such, we can easily start systemic chemotherapy.  I appreciate the great care that she is getting down the ICU!!  Pete E.  1 Thessalonians 5:16-18

## 2013-11-19 NOTE — Progress Notes (Signed)
Physical Therapy Treatment Patient Details Name: Katie [name] MRN: [id number] DOB: 01-30-1951 Today's Date: 11/19/2013    History of Present Illness 63 y.o. F s/p R parietal occipital craniotomy and resection of brain tumor 3/24    PT Comments    Was unable to see pt in am due to lethargy.  On arrival spouse was at bed side engaging pt in conversation.  Pt speaking more but some times difficult to understand gurgled/mumbled words. Assisted pt from supine to EOB to perform static/dynamic sitting balance activities as well as visual tracking and cognitive activities. Impaired gross motor control required 75% hand over hand assistance.  Performed static standing x 2 min total assist + 1 "Bear Hug" tech as she required support to stabilize hips and knees. Pt was able to take a few side steps along bed.  Returned pt to supine position as this brief session exhausted her. Positioned with pillows for comfort.  Pt stated "I'm going to take a nap".  Follow Up Recommendations  CIR     Equipment Recommendations       Recommendations for Other Services       Precautions / Restrictions Precautions Precautions: Fall Restrictions Weight Bearing Restrictions: No    Mobility  Bed Mobility Overal bed mobility: Needs Assistance;+2 for physical assistance Bed Mobility: Supine to Sit     Supine to sit: +2 for physical assistance;Max assist     General bed mobility comments: Pt demon increased tolerance sitting EOB but still required total assist to prevent LOB in all planes.  Poor self correction and spastic mvts requiring total assist to prevent pt falling over.  Performed static and dynamic sitting balance activities EOB with cross midline reaching.  Transfers Overall transfer level: Needs assistance Equipment used: None Transfers: Sit to/from Stand Sit to Stand: Max assist;+2 safety/equipment         General transfer comment: sit to stand "bear hug" max assist static standing x 2 min pt was  able to support her own body weight but required tactile cueing/support for hip stability and lateral support for upright midline posture.  Performed slight weight shifting side to side in which pt was able to tolerate but required B knee blocking to prevent buckle.  Stand to sit required MAX assist to control decend.    Ambulation/Gait   Ambulation Distance (Feet): 1 Feet     Gait velocity: Pt performed several side steps to L towards [facility] with total assist "bear hug" support using slight rocking motion along with hip support to achieve side steps.         Stairs            Wheelchair Mobility    Modified Rankin (Stroke Patients Only)       Balance                                    Cognition                            Exercises      General Comments        Pertinent Vitals/Pain     Home Living                      Prior Function            PT Goals (current goals can now be  found in the care plan section) Progress towards PT goals: Progressing toward goals    Frequency       PT Plan      End of Session Equipment Utilized During Treatment: Gait belt Activity Tolerance: Patient limited by fatigue Patient left: in bed;with call bell/phone within reach;with family/visitor present     Time: 6701-4103 PT Time Calculation (min): 27 min  Charges:  $Therapeutic Activity: 8-22 mins $Neuromuscular Re-education: 8-22 mins                    G Codes:      Rica Koyanagi  PTA [facility]  Acute  Rehab Pager      [phone]

## 2013-11-20 ENCOUNTER — Inpatient Hospital Stay (HOSPITAL_COMMUNITY): Payer: BC Managed Care – PPO

## 2013-11-20 LAB — CBC
HCT: 36.2 % (ref 36.0–46.0)
Hemoglobin: 12 g/dL (ref 12.0–15.0)
MCH: 31.3 pg (ref 26.0–34.0)
MCHC: 33.1 g/dL (ref 30.0–36.0)
MCV: 94.3 fL (ref 78.0–100.0)
Platelets: 288 10*3/uL (ref 150–400)
RBC: 3.84 MIL/uL — AB (ref 3.87–5.11)
RDW: 12.9 % (ref 11.5–15.5)
WBC: 17.9 10*3/uL — ABNORMAL HIGH (ref 4.0–10.5)

## 2013-11-20 LAB — BASIC METABOLIC PANEL
BUN: 40 mg/dL — AB (ref 6–23)
CALCIUM: 8.4 mg/dL (ref 8.4–10.5)
CO2: 28 meq/L (ref 19–32)
CREATININE: 0.51 mg/dL (ref 0.50–1.10)
Chloride: 101 mEq/L (ref 96–112)
GFR calc Af Amer: >90 mL/min (ref >90)
GLUCOSE: 147 mg/dL — AB (ref 70–99)
Potassium: 4.1 mEq/L (ref 3.7–5.3)
SODIUM: 137 meq/L (ref 137–147)

## 2013-11-20 LAB — GLUCOSE, CAPILLARY
GLUCOSE-CAPILLARY: 143 mg/dL — AB (ref 70–99)
GLUCOSE-CAPILLARY: 192 mg/dL — AB (ref 70–99)
Glucose-Capillary: 143 mg/dL — ABNORMAL HIGH (ref 70–99)
Glucose-Capillary: 132 mg/dL — ABNORMAL HIGH (ref 70–99)
Glucose-Capillary: 143 mg/dL — ABNORMAL HIGH (ref 70–99)
Glucose-Capillary: 156 mg/dL — ABNORMAL HIGH (ref 70–99)

## 2013-11-20 LAB — LACTATE DEHYDROGENASE: LDH: 322 U/L — ABNORMAL HIGH (ref 94–250)

## 2013-11-20 LAB — TRIGLYCERIDES: Triglycerides: 46 mg/dL (ref <150)

## 2013-11-20 NOTE — Progress Notes (Signed)
Patient ID: Katie [name], female   DOB: 21-Nov-1950, 63 y.o.   MRN: [id number] CT head shows further improvement in edema.Less shift. Dressing changed showing slight drainage of sanguinous fluid consistent with csf. Pressure dressing applied. If this does not keep dry will likely need revision of closure.

## 2013-11-20 NOTE — Progress Notes (Signed)
[facility] PROGRESS NOTE  Katie [name] YWV:[id number] DOB: 05/13/1951 DOA: 11/11/2013 PCP: Ezequiel Kayser, MD Brief narrative:  Katie [name] is an 63 y.o. female with a PMH of tobacco abuse and hyperlipidemia who was admitted by neurosurgery on 11/11/13 after suffering a syncopal episode resulting in head trauma. A CT scan of the brain, done on admission, demonstrated 2 large right-sided lesions in the frontal and parietal/occipital area with 12 mm of shift and subfalcine herniation.  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 Ellene Route of neurosurgery and underwent a right parietal/occipital craniotomy with resection of brain tumor on 11/12/13. Pathologic diagnosis from the frozen section was obtained and showed malignant carcinoma. The patient was intubated postoperatively/successfully extubated 11/15/1498 the direction of pulmonology. Tube feeding is being provided through a NG tube. Patient underwent followup CT scanning of the brain on 11/13/13 which showed less shift and mass effect with no significant bleeding and tumor bed. She continues on Keppra and Decadron, with recommendations to slowly taper dexamethasone down to 2 mg twice a day.  - Rad/Onc on board and making further recommendations - Per nursing patient has had some draining from her suture site from craniotomy. [facility] surgeon aware and currently considering transferring [facility] for [facility].  Active Problems:   Underweight / Severe protein calorie malnutrition / Malignancy associated cachexia  BMI is 18.7.  She is receiving tube feeding.  Family reports significant weight loss since November (15 lbs). Likely from malignancy associated cachexia.   Acute respiratory failure  Resolved. Extubated 11/15/13 after being intubated x 3 days. Had an episode of hypoxia necessitating increased concentration of oxygen overnight. - chest x ray ordered which reported no  alveolar pneumonia nor pleural effusion   Altered mental status  Secondary to brain metastasis and major surgery.  Leukocytosis - in context of recent operation.  Will leave broad spectrum antibiotics for now but will consider discontinuing if no source of infection found and patient's wbc trend down as well as patient remaining afebrile.  Small cell lung cancer  Dr. Marin Olp saw the patient in consultation on 11/15/13. CT scan of the chest and abdomen revealed a left lower lobe nodule and significant mediastinal adenopathy. Whole brain radiation was recommended, and therefore she was [facility] She'll also need chemotherapy.  - Management per oncology  HTN (hypertension)  Being managed with when necessary labetalol.   Steroid-induced hyperglycemia  Continue SSI for hyperglycemia. CBGs 135-162.   DVT Prophylaxis  Continue SCDs.   Code Status: full Family Communication: Discussed with patient and family member at bedside Disposition Plan: Per specialist involved.   Consultants:  Neurosurgery  Oncology  Procedures:  As listed above.  Antibiotics:  Zosyn and Vancomycin  HPI/Subjective: Pt per family is more alert today.  Nursing reports that there has been some drainage at incision site. She indicates neurosurgery has been updated.  Objective: Filed Vitals:   11/20/13 0600  BP: 136/68  Pulse: 73  Temp:   Resp: 17    Intake/Output Summary (Last 24 hours) at 11/20/13 1131 Last data filed at 11/20/13 0626  Gross per 24 hour  Intake 1862.5 ml  Output   1185 ml  Net  677.5 ml   Filed Weights   11/18/13 0456 11/19/13 0500 11/20/13 0400  Weight: 56.3 kg (124 lb 1.9 oz) 53.9 kg (118 lb 13.3 oz) 53.5 kg (117 lb 15.1 oz)    Exam:   General:  Pt in NAD, laying in  bed supine  Cardiovascular: RRR, no MRG  Respiratory: CTA BL, no wheezes, no Increased WOB  Abdomen: soft, NT, ND  Musculoskeletal: no cyanosis or clubbing   Data Reviewed: Basic  Metabolic Panel:  Recent Labs Lab 11/15/13 0316 11/16/13 0345 11/17/13 0256 11/19/13 0320 11/20/13 0315  NA 142 140 138 136* 137  K 4.3 3.8 4.4 4.4 4.1  CL 103 100 99 99 101  CO2 27 28 27 27 28   GLUCOSE 123* 171* 152* 174* 147*  BUN 30* 26* 29* 34* 40*  CREATININE 0.51 0.48* 0.46* 0.46* 0.51  CALCIUM 8.4 8.6 8.8 8.7 8.4   Liver Function Tests:  Recent Labs Lab 11/14/13 0500  AST 22  ALT 17  ALKPHOS 20*  BILITOT 0.3  PROT 4.6*  ALBUMIN 2.5*   No results found for this basename: LIPASE, AMYLASE,  in the last 168 hours No results found for this basename: AMMONIA,  in the last 168 hours CBC:  Recent Labs Lab 11/14/13 0500 11/15/13 0316 11/19/13 0320 11/20/13 0315  WBC 12.6* 13.3* 18.5* 17.9*  NEUTROABS 11.3* 11.9*  --   --   HGB 10.4* 10.7* 12.6 12.0  HCT 30.2* 31.4* 37.0 36.2  MCV 94.4 94.3 93.4 94.3  PLT 181 193 272 288   Cardiac Enzymes: No results found for this basename: CKTOTAL, CKMB, CKMBINDEX, TROPONINI,  in the last 168 hours BNP (last 3 results) No results found for this basename: PROBNP,  in the last 8760 hours CBG:  Recent Labs Lab 11/19/13 1609 11/19/13 1944 11/20/13 0032 11/20/13 0324 11/20/13 0915  GLUCAP 152* 142* 143* 143* 132*    Recent Results (from the past 240 hour(s))  MRSA PCR SCREENING     Status: None   Collection Time    11/11/13  9:02 PM      Result Value Ref Range Status   MRSA by PCR NEGATIVE  NEGATIVE Final   Comment:            The GeneXpert MRSA Assay (FDA     approved for NASAL specimens     only), is one component of a     comprehensive MRSA colonization     surveillance program. It is not     intended to diagnose MRSA     infection nor to guide or     monitor treatment for     MRSA infections.     Studies: Ct Head Wo Contrast  11/20/2013   CLINICAL DATA:  Persistent lethargy status post craniotomy for resection of lung cancer brain metastasis.  EXAM: CT HEAD WITHOUT CONTRAST  TECHNIQUE: Contiguous axial  images were obtained from the base of the skull through the vertex without intravenous contrast.  COMPARISON:  11/13/2013  FINDINGS: Sequelae of right parietal craniotomy for tumor resection are again identified. Pneumocephalus has resolved. There is trace extra-axial fluid at the craniotomy site. Small amount of blood products at the resection site have decreased in conspicuity. Vasogenic edema in this region it has also decreased. There is decreased effacement of the right lateral ventricle and decreased leftward midline shift, now measuring 7 mm (previously 11 mm). Trace blood is present in the occipital horn of the left lateral ventricle. There is no ventricular dilatation. 2.2 x 2.0 cm right frontal mass is unchanged, as is an adjacent 5 mm density anteroinferior to the mass which could reflect a small focus of hemorrhage as suggested on the prior CT or instead may represent the inferior most portion of the mass. Vasogenic edema surrounding  the right frontal lobe mass is similar to the prior study.  Hypoattenuation involving cortex and white matter in the medial left parietal lobe is again seen although this is slightly less prominent than on the prior CT. Hypoattenuation in the region of the left hippocampal tail similar to the prior study. The visualized paranasal sinuses and mastoid air cells are clear. Orbits are unremarkable.  IMPRESSION: 1. Evolving postoperative changes in the right temporoparietal region with decreased edema and midline shift. 2. Unchanged appearance of right frontal mass and surrounding edema. 3. Trace blood in the occipital horn of the left lateral ventricle. 4. Stable to slightly decreased conspicuity of medial left parietal hypoattenuation, which could reflect ischemia.   Electronically Signed   By: Logan Bores   On: 11/20/2013 08:33   Dg Chest Port 1 View  11/19/2013   CLINICAL DATA:  Weakness, hypoxia, and shortness of breath, history of tobacco use  EXAM: PORTABLE CHEST - 1  VIEW  COMPARISON:  DG CHEST 1V PORT dated 11/16/2013  FINDINGS: The lungs remain hyperinflated. There is no focal infiltrate. The interstitial markings are mildly prominent diffusely but not greatly changed. The cardiac silhouette is normal in size. The pulmonary vascularity is not engorged. The mediastinum is normal in width. The proximal port of the esophagogastric tube is at the GE junction and advancement by 5-10 cm is recommended.  IMPRESSION: There is hyperinflation consistent with COPD. Mild prominence of the interstitial markings may reflect minimal interstitial edema or interstitial pneumonia. There is no alveolar pneumonia nor pleural effusion.   Electronically Signed   By: David  Martinique   On: 11/19/2013 09:09    Scheduled Meds: . antiseptic oral rinse  15 mL Mouth Rinse QID  . chlorhexidine  15 mL Mouth/Throat BID  . dexamethasone  10 mg Intravenous 4 times per day  . insulin aspart  0-15 Units Subcutaneous 6 times per day  . levETIRAcetam  500 mg Per Tube BID  . multivitamin  5 mL Per Tube Daily  . piperacillin-tazobactam (ZOSYN)  IV  3.375 g Intravenous Q8H  . ranitidine  150 mg Per Tube BID  . vancomycin  750 mg Intravenous Q12H   Continuous Infusions: . sodium chloride 20 mL (11/17/13 1323)  . feeding supplement (PIVOT 1.5 CAL) 1,000 mL (11/18/13 1600)    Principal Problem:   Syncope secondary to brain metastasis Active Problems:   Acute respiratory failure   Altered mental status   Small cell lung cancer   HTN (hypertension)   Steroid-induced hyperglycemia   Fall with head trauma   Underweight   Protein-calorie malnutrition, severe   Malignant cachexia   Malnutrition of moderate degree    Time spent: > 35 minutes    Velvet Bathe  [facility] Pager [phone]. If 7PM-7AM, please contact night-coverage at [email or web address], password [facility] 11/20/2013, 11:31 AM  LOS: 9 days

## 2013-11-20 NOTE — Progress Notes (Signed)
11/19/13 1800 restraints entered in EPIC for [employer], Therapist, sports. Verbally verified.

## 2013-11-20 NOTE — Progress Notes (Signed)
Subjective: Patient reports drainage noted on dressing.   Objective: Vital signs in last 24 hours: Temp:  [98.1 F (36.7 C)-99.4 F (37.4 C)] 98.2 F (36.8 C) (04/01 1600) Pulse Rate:  [61-76] 69 (04/01 1800) Resp:  [12-19] 15 (04/01 1800) BP: (112-148)/(64-80) 126/80 mmHg (04/01 1600) SpO2:  [95 %-100 %] 96 % (04/01 1800) Weight:  [53.5 kg (117 lb 15.1 oz)] 53.5 kg (117 lb 15.1 oz) (04/01 0400)  Intake/Output from previous day: 03/31 0701 - 04/01 0700 In: 2262.5 [I.V.:620; NG/GT:1155; IV Piggyback:487.5] Out: 1355 [Urine:1355] Intake/Output this shift:    Levle of conciousness continues to improve. dressing with sanguinous drainage.  Lab Results:  Recent Labs  11/19/13 0320 11/20/13 0315  WBC 18.5* 17.9*  HGB 12.6 12.0  HCT 37.0 36.2  PLT 272 288   BMET  Recent Labs  11/19/13 0320 11/20/13 0315  NA 136* 137  K 4.4 4.1  CL 99 101  CO2 27 28  GLUCOSE 174* 147*  BUN 34* 40*  CREATININE 0.46* 0.51  CALCIUM 8.7 8.4    Studies/Results: Ct Head Wo Contrast  11/20/2013   CLINICAL DATA:  Persistent lethargy status post craniotomy for resection of lung cancer brain metastasis.  EXAM: CT HEAD WITHOUT CONTRAST  TECHNIQUE: Contiguous axial images were obtained from the base of the skull through the vertex without intravenous contrast.  COMPARISON:  11/13/2013  FINDINGS: Sequelae of right parietal craniotomy for tumor resection are again identified. Pneumocephalus has resolved. There is trace extra-axial fluid at the craniotomy site. Small amount of blood products at the resection site have decreased in conspicuity. Vasogenic edema in this region it has also decreased. There is decreased effacement of the right lateral ventricle and decreased leftward midline shift, now measuring 7 mm (previously 11 mm). Trace blood is present in the occipital horn of the left lateral ventricle. There is no ventricular dilatation. 2.2 x 2.0 cm right frontal mass is unchanged, as is an adjacent  5 mm density anteroinferior to the mass which could reflect a small focus of hemorrhage as suggested on the prior CT or instead may represent the inferior most portion of the mass. Vasogenic edema surrounding the right frontal lobe mass is similar to the prior study.  Hypoattenuation involving cortex and white matter in the medial left parietal lobe is again seen although this is slightly less prominent than on the prior CT. Hypoattenuation in the region of the left hippocampal tail similar to the prior study. The visualized paranasal sinuses and mastoid air cells are clear. Orbits are unremarkable.  IMPRESSION: 1. Evolving postoperative changes in the right temporoparietal region with decreased edema and midline shift. 2. Unchanged appearance of right frontal mass and surrounding edema. 3. Trace blood in the occipital horn of the left lateral ventricle. 4. Stable to slightly decreased conspicuity of medial left parietal hypoattenuation, which could reflect ischemia.   Electronically Signed   By: Logan Bores   On: 11/20/2013 08:33   Dg Chest Port 1 View  11/19/2013   CLINICAL DATA:  Weakness, hypoxia, and shortness of breath, history of tobacco use  EXAM: PORTABLE CHEST - 1 VIEW  COMPARISON:  DG CHEST 1V PORT dated 11/16/2013  FINDINGS: The lungs remain hyperinflated. There is no focal infiltrate. The interstitial markings are mildly prominent diffusely but not greatly changed. The cardiac silhouette is normal in size. The pulmonary vascularity is not engorged. The mediastinum is normal in width. The proximal port of the esophagogastric tube is at the GE junction and advancement  by 5-10 cm is recommended.  IMPRESSION: There is hyperinflation consistent with COPD. Mild prominence of the interstitial markings may reflect minimal interstitial edema or interstitial pneumonia. There is no alveolar pneumonia nor pleural effusion.   Electronically Signed   By: David  Martinique   On: 11/19/2013 09:09     Assessment/Plan: Csf leak from bottom of incision will need to be revised with drain put inplace to allow tissue to coapt.   LOS: 9 days  Will need transfer back to [facility] for procedure to be done in OR.   Katie [name] J 11/20/2013, 7:59 PM

## 2013-11-20 NOTE — Progress Notes (Signed)
Speech Language Pathology Treatment: Dysphagia  Patient Details Name: Ericca Labra MRN: [id number] DOB: [birth year] Today's Date: 11/20/2013 Time: 2263-3354 SLP Time Calculation (min): 45 min  Assessment / Plan / Recommendation Clinical Impression  Pt demonstrating improved LOA today - does require moderate verbal cues to keep eyes open.  SLP had pt self feed with assistance (hand over hand) cracker, applesauce, nectar juice, thin water and ice.  Speech is mildly dysarthric indicative of oral abilities.  Swallow reflex was mildly delayed with overt immediate productive cough after thin and nectar consistencies-concerning for aspiration.  Pt required max verbal/visual cues to cease talking with food in mouth - stating "mmmm" indicating pleasure during oral manipulation.    Note pt has tube for nutrition and with waxing/waning LOA, tube feeds may be helpful for nutritional support.  SLP also ?s if tube size is negatively impacting epiglottic deflection/airway protection.  Question if tube could be removed during instrumental evaluation if it is noted to worsen dysphagia.     At this time, recommend continue tube feeds for nutrition and allow pt to consume small single ice chips as tolerated.  SLP educated family to recommendations and they are in agreement.    Note pt for possible transfer to [facility] due to ? CSF leak per RN.  If pt is to have repeat surgery, would recommend to wait until surgery completed for MBS due to possible impact of intubation.    SLP would recommend pt have MBS prior to initiation of po due to neurological changes from metastatic cancer that may impact neuromuscular swallow abilities/sensation/airway protection and clinical indications of dysphagia.  Hopeful for pt to be able to consume modified diet soon.    She would benefit from speech/cognitive evaluation due to cognitive linguistic deficits from neurological issues.        HPI HPI: 63 year old female admitted 11/11/13 for  craniotomy and resection of right fronto-parietal tumor.  PMH insignificant.  Pt also has LLL mass c/w tumor.  Pt was extubated 3/27 and BSE completed this weekend with recommendation to continue npo with slp follow up.  Pt has tube feeding for nutrition.     Pertinent Vitals Afebrile, decreased  SLP Plan  Continue with current plan of care    Recommendations Diet recommendations: NPO (ice chips) Medication Administration: Via alternative means Supervision: Trained caregiver to feed patient;Full supervision/cueing for compensatory strategies Compensations: Slow rate;Small sips/bites              Oral Care Recommendations: Oral care Q4 per protocol Follow up Recommendations: Skilled Nursing facility;Inpatient Rehab Plan: Continue with current plan of care    [place], [place], [place] [facility] SLP [phone]

## 2013-11-20 NOTE — Progress Notes (Signed)
Physical Therapy Treatment Patient Details Name: Katie [name] MRN: [id number] DOB: [birth year] Today's Date: 11/20/2013    History of Present Illness 63 y.o. F s/p R parietal occipital craniotomy and resection of brain tumor 3/24    PT Comments    Pt  progressing with her alertness and speech.  Assisted OOB to straight back chair to perform static/dynamic sitting activities as well as visual tracking and cognitive recognition using a deck of cards.  While sitting had pt reach beyond midline, cross body and identify number and suit of each card. Pt more engaging and alert.  Assisted with amb short distance + 2 assist using EVA walker.  Positioned in recliner with multiple pillows and re applied wrist restraints.  Advised nursing to assist pt back to bed in approx one hour.   Follow Up Recommendations  CIR     Equipment Recommendations       Recommendations for Other Services       Precautions / Restrictions Precautions Precautions: Fall Restrictions Weight Bearing Restrictions: No    Mobility  Bed Mobility Overal bed mobility: Needs Assistance;+2 for physical assistance Bed Mobility: Supine to Sit     Supine to sit: +2 for physical assistance;Mod assist     General bed mobility comments: impulsive/squirmy.  Assisted pt to EOB at ModAssist esp to scoot.  Extra assist for safety with multiple lines/leads/tubes.  Pt requires total assist to prevent LOB all planes as she demon constant gross motor mvts with poor perception of midline.    Transfers Overall transfer level: Needs assistance Equipment used: None Transfers: Sit to/from [facility] Sit to Stand: Max assist;+2 safety/equipment;+2 physical assistance Stand pivot transfers: Max assist;+2 safety/equipment;+2 physical assistance       General transfer comment: + 2 assist transfered pt from bed to straight back chair with caution to multiple lines/leads/tubes.  Very weak unsteady stand pivot requiring hand  over hand placement.  Pt rquired assist to control decend.   Ambulation/Gait Ambulation/Gait assistance: +2 physical assistance;+2 safety/equipment Ambulation Distance (Feet): 3 Feet Assistive device:  (EVA walker ) Gait Pattern/deviations: Step-to pattern;Step-through pattern;Narrow base of support;Drifts right/left Gait velocity: decreased   General Gait Details: used EVA walker for increased support.  + 2 total assist pt 50% with spouse following with recliner and sister pushing IV pole.  Required increased time to complete steps and 50% tactile cueing to assist with weight shift and upright posture.   Stairs            Wheelchair Mobility    Modified Rankin (Stroke Patients Only)       Balance                                    Cognition                            Exercises      General Comments        Pertinent Vitals/Pain     Home Living                      Prior Function            PT Goals (current goals can now be found in the care plan section) Progress towards PT goals: Progressing toward goals    Frequency  Min 4X/week    PT Plan  End of Session Equipment Utilized During Treatment: Gait belt Activity Tolerance: Patient limited by fatigue Patient left: in chair;with call bell/phone within reach;with family/visitor present;with restraints reapplied     Time: 1125-1210 PT Time Calculation (min): 45 min  Charges:  $Gait Training: 8-22 mins $Therapeutic Activity: 8-22 mins $Neuromuscular Re-education: 8-22 mins                    G Codes:      Rica Koyanagi  PTA [facility]  Acute  Rehab Pager      [phone]

## 2013-11-21 ENCOUNTER — Other Ambulatory Visit: Payer: Self-pay | Admitting: Neurological Surgery

## 2013-11-21 LAB — LACTATE DEHYDROGENASE: LDH: 343 U/L — AB (ref 94–250)

## 2013-11-21 LAB — GLUCOSE, CAPILLARY
GLUCOSE-CAPILLARY: 127 mg/dL — AB (ref 70–99)
GLUCOSE-CAPILLARY: 136 mg/dL — AB (ref 70–99)
GLUCOSE-CAPILLARY: 139 mg/dL — AB (ref 70–99)
GLUCOSE-CAPILLARY: 151 mg/dL — AB (ref 70–99)
Glucose-Capillary: 160 mg/dL — ABNORMAL HIGH (ref 70–99)
Glucose-Capillary: 145 mg/dL — ABNORMAL HIGH (ref 70–99)
Glucose-Capillary: 130 mg/dL — ABNORMAL HIGH (ref 70–99)

## 2013-11-21 LAB — BASIC METABOLIC PANEL
BUN: 39 mg/dL — ABNORMAL HIGH (ref 6–23)
CHLORIDE: 102 meq/L (ref 96–112)
CO2: 27 meq/L (ref 19–32)
Calcium: 8.8 mg/dL (ref 8.4–10.5)
Creatinine, Ser: 0.48 mg/dL — ABNORMAL LOW (ref 0.50–1.10)
GFR calc non Af Amer: >90 mL/min (ref >90)
Glucose, Bld: 123 mg/dL — ABNORMAL HIGH (ref 70–99)
POTASSIUM: 4.1 meq/L (ref 3.7–5.3)
Sodium: 137 mEq/L (ref 137–147)

## 2013-11-21 LAB — CBC
HEMATOCRIT: 37.3 % (ref 36.0–46.0)
Hemoglobin: 13.2 g/dL (ref 12.0–15.0)
MCH: 32.9 pg (ref 26.0–34.0)
MCHC: 35.4 g/dL (ref 30.0–36.0)
MCV: 93 fL (ref 78.0–100.0)
Platelets: 287 10*3/uL (ref 150–400)
RBC: 4.01 MIL/uL (ref 3.87–5.11)
RDW: 12.8 % (ref 11.5–15.5)
WBC: 19.9 10*3/uL — AB (ref 4.0–10.5)

## 2013-11-21 NOTE — Progress Notes (Signed)
SLP Cancellation Note  Patient Details Name: Katie [name] MRN: [id number] DOB: August 11, 1951   Cancelled treatment:       Reason Eval/Treat Not Completed: Medical issues which prohibited therapy (pt for transfer to [facility] today per neurosurgeon)   Claudie Fisherman, [place] [facility] SLP [phone]

## 2013-11-21 NOTE — Progress Notes (Signed)
Patient trans in from [facility] by [facility] this afternoon, alert and oriented to self. Came in with restraints on bil. Wrist.no family member present. Call bell within reach and bed alarm set up.

## 2013-11-21 NOTE — Progress Notes (Signed)
Subjective: Patient reports still with drainge from incision  Objective: Vital signs in last 24 hours: Temp:  [96 F (35.6 C)-98.3 F (36.8 C)] 96 F (35.6 C) (04/02 1443) Pulse Rate:  [59-73] 72 (04/02 1443) Resp:  [12-18] 18 (04/02 1443) BP: (115-146)/(57-97) 115/57 mmHg (04/02 1443) SpO2:  [95 %-98 %] 98 % (04/02 1443) Weight:  [56.2 kg (123 lb 14.4 oz)] 56.2 kg (123 lb 14.4 oz) (04/02 0319)  Intake/Output from previous day: 04/01 0701 - 04/02 0700 In: 1205 [I.V.:140; NG/GT:495; IV Piggyback:450] Out: 1275 [Urine:1275] Intake/Output this shift: Total I/O In: 705 [Other:30; NG/GT:675] Out: 250 [Urine:250]  none  Lab Results:  Recent Labs  11/20/13 0315 11/21/13 0320  WBC 17.9* 19.9*  HGB 12.0 13.2  HCT 36.2 37.3  PLT 288 287   BMET  Recent Labs  11/20/13 0315 11/21/13 0320  NA 137 137  K 4.1 4.1  CL 101 102  CO2 28 27  GLUCOSE 147* 123*  BUN 40* 39*  CREATININE 0.51 0.48*  CALCIUM 8.4 8.8    Studies/Results: Ct Head Wo Contrast  11/20/2013   CLINICAL DATA:  Persistent lethargy status post craniotomy for resection of lung cancer brain metastasis.  EXAM: CT HEAD WITHOUT CONTRAST  TECHNIQUE: Contiguous axial images were obtained from the base of the skull through the vertex without intravenous contrast.  COMPARISON:  11/13/2013  FINDINGS: Sequelae of right parietal craniotomy for tumor resection are again identified. Pneumocephalus has resolved. There is trace extra-axial fluid at the craniotomy site. Small amount of blood products at the resection site have decreased in conspicuity. Vasogenic edema in this region it has also decreased. There is decreased effacement of the right lateral ventricle and decreased leftward midline shift, now measuring 7 mm (previously 11 mm). Trace blood is present in the occipital horn of the left lateral ventricle. There is no ventricular dilatation. 2.2 x 2.0 cm right frontal mass is unchanged, as is an adjacent 5 mm density  anteroinferior to the mass which could reflect a small focus of hemorrhage as suggested on the prior CT or instead may represent the inferior most portion of the mass. Vasogenic edema surrounding the right frontal lobe mass is similar to the prior study.  Hypoattenuation involving cortex and white matter in the medial left parietal lobe is again seen although this is slightly less prominent than on the prior CT. Hypoattenuation in the region of the left hippocampal tail similar to the prior study. The visualized paranasal sinuses and mastoid air cells are clear. Orbits are unremarkable.  IMPRESSION: 1. Evolving postoperative changes in the right temporoparietal region with decreased edema and midline shift. 2. Unchanged appearance of right frontal mass and surrounding edema. 3. Trace blood in the occipital horn of the left lateral ventricle. 4. Stable to slightly decreased conspicuity of medial left parietal hypoattenuation, which could reflect ischemia.   Electronically Signed   By: Logan Bores   On: 11/20/2013 08:33    Assessment/Plan: For revision of wound in am   LOS: 10 days  for OR   Boleslaw Borghi J 11/21/2013, 6:23 PM

## 2013-11-21 NOTE — Progress Notes (Signed)
PT Cancellation Note  ___Treatment cancelled today due to medical issues with patient which prohibited therapy  ___ Treatment cancelled today due to patient receiving procedure or test   ___ Treatment cancelled today due to patient's refusal to participate   _X_ Treatment cancelled until issue of poss SPF leakage is resolved and/or  MD clarifies/clears pt for OOB activity/Physical Therapy.  Rica Koyanagi  PTA [facility]  Acute  Rehab Pager      [phone]

## 2013-11-21 NOTE — Progress Notes (Signed)
[facility] PROGRESS NOTE  Katie [name] HUD:[id number] DOB: December 09, 1950 DOA: 11/11/2013 PCP: Ezequiel Kayser, MD Brief narrative:  Katie [name] is an 63 y.o. female with a PMH of tobacco abuse and hyperlipidemia who was admitted by neurosurgery on 11/11/13 after suffering a syncopal episode resulting in head trauma. A CT scan of the brain, done on admission, demonstrated 2 large right-sided lesions in the frontal and parietal/occipital area with 12 mm of shift and subfalcine herniation.  Patient underwent a right parietal/occipital craniotomy. Found to have CSF leak from bottom of incision that needs revision with drain put inplace. Neurosurgery requesting transfer to [place].  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 Ellene Route of neurosurgery and underwent a right parietal/occipital craniotomy with resection of brain tumor on 11/12/13. Pathologic diagnosis from the frozen section was obtained and showed malignant carcinoma. The patient was intubated postoperatively/successfully extubated 11/15/1498 the direction of pulmonology. Tube feeding is being provided through a NG tube. Patient underwent followup CT scanning of the brain on 11/13/13 which showed less shift and mass effect with no significant bleeding and tumor bed. She continues on Keppra and Decadron, with recommendations to slowly taper dexamethasone down to 2 mg twice a day.  - Rad/Onc on board and making further recommendations - Per nursing patient has had some draining from her suture site from craniotomy. [facility] surgeon aware and plans are for revision at [facility]. Will place order for transfer.  Active Problems:   Underweight / Severe protein calorie malnutrition / Malignancy associated cachexia  BMI is 18.7.  She is receiving tube feeding.  Family reports significant weight loss since November (15 lbs). Likely from malignancy associated cachexia.   Acute  respiratory failure  Resolved. Extubated 11/15/13 after being intubated x 3 days.  - chest x ray ordered which reported no alveolar pneumonia nor pleural effusion   Altered mental status  Secondary to brain metastasis and major surgery.  Leukocytosis - in context of recent operation.  Will leave broad spectrum antibiotics for now but will consider discontinuing if no source of infection found and patient's wbc trend down as well as patient remaining afebrile.  Small cell lung cancer  Dr. Marin Olp saw the patient in consultation on 11/15/13. CT scan of the chest and abdomen revealed a left lower lobe nodule and significant mediastinal adenopathy. Whole brain radiation was recommended, and therefore she was transferred to [facility]. She'll also need chemotherapy.  - Management per oncology  HTN (hypertension)  Being managed with when necessary labetalol.   Steroid-induced hyperglycemia  Continue SSI for hyperglycemia. CBGs 135-162.   DVT Prophylaxis  Continue SCDs.   Code Status: full Family Communication: Discussed with patient and family member at bedside Disposition Plan: Per specialist involved.   Consultants:  Neurosurgery  Oncology  Procedures:  As listed above.  Antibiotics:  Zosyn and Vancomycin  HPI/Subjective: Katie [name] found to have CSF leak.  Nursing requesting restraint renewal order as patient pulls on IV's etc.  Otherwise no other complaints overnight.  Objective: Filed Vitals:   11/21/13 0332  BP: 132/77  Pulse: 66  Temp:   Resp: 12    Intake/Output Summary (Last 24 hours) at 11/21/13 0846 Last data filed at 11/21/13 0800  Gross per 24 hour  Intake   1790 ml  Output   1275 ml  Net    515 ml   Filed Weights   11/19/13 0500 11/20/13 0400 11/21/13 0319  Weight: 53.9 kg (118 lb  13.3 oz) 53.5 kg (117 lb 15.1 oz) 56.2 kg (123 lb 14.4 oz)    Exam:   General:  Katie [name] in NAD, laying in bed supine  Cardiovascular: RRR, no MRG  Respiratory: CTA BL, no wheezes,  no Increased WOB  Abdomen: soft, NT, ND  Musculoskeletal: no cyanosis or clubbing   Data Reviewed: Basic Metabolic Panel:  Recent Labs Lab 11/16/13 0345 11/17/13 0256 11/19/13 0320 11/20/13 0315 11/21/13 0320  NA 140 138 136* 137 137  K 3.8 4.4 4.4 4.1 4.1  CL 100 99 99 101 102  CO2 28 27 27 28 27   GLUCOSE 171* 152* 174* 147* 123*  BUN 26* 29* 34* 40* 39*  CREATININE 0.48* 0.46* 0.46* 0.51 0.48*  CALCIUM 8.6 8.8 8.7 8.4 8.8   Liver Function Tests: No results found for this basename: AST, ALT, ALKPHOS, BILITOT, PROT, ALBUMIN,  in the last 168 hours No results found for this basename: LIPASE, AMYLASE,  in the last 168 hours No results found for this basename: AMMONIA,  in the last 168 hours CBC:  Recent Labs Lab 11/15/13 0316 11/19/13 0320 11/20/13 0315 11/21/13 0320  WBC 13.3* 18.5* 17.9* 19.9*  NEUTROABS 11.9*  --   --   --   HGB 10.7* 12.6 12.0 13.2  HCT 31.4* 37.0 36.2 37.3  MCV 94.3 93.4 94.3 93.0  PLT 193 272 288 287   Cardiac Enzymes: No results found for this basename: CKTOTAL, CKMB, CKMBINDEX, TROPONINI,  in the last 168 hours BNP (last 3 results) No results found for this basename: PROBNP,  in the last 8760 hours CBG:  Recent Labs Lab 11/20/13 1225 11/20/13 1619 11/20/13 2010 11/20/13 2328 11/21/13 0317  GLUCAP 143* 192* 156* 160* 127*    Recent Results (from the past 240 hour(s))  MRSA PCR SCREENING     Status: None   Collection Time    11/11/13  9:02 PM      Result Value Ref Range Status   MRSA by PCR NEGATIVE  NEGATIVE Final   Comment:            The GeneXpert MRSA Assay (FDA     approved for NASAL specimens     only), is one component of a     comprehensive MRSA colonization     surveillance program. It is not     intended to diagnose MRSA     infection nor to guide or     monitor treatment for     MRSA infections.     Studies: Ct Head Wo Contrast  11/20/2013   CLINICAL DATA:  Persistent lethargy status post craniotomy for  resection of lung cancer brain metastasis.  EXAM: CT HEAD WITHOUT CONTRAST  TECHNIQUE: Contiguous axial images were obtained from the base of the skull through the vertex without intravenous contrast.  COMPARISON:  11/13/2013  FINDINGS: Sequelae of right parietal craniotomy for tumor resection are again identified. Pneumocephalus has resolved. There is trace extra-axial fluid at the craniotomy site. Small amount of blood products at the resection site have decreased in conspicuity. Vasogenic edema in this region it has also decreased. There is decreased effacement of the right lateral ventricle and decreased leftward midline shift, now measuring 7 mm (previously 11 mm). Trace blood is present in the occipital horn of the left lateral ventricle. There is no ventricular dilatation. 2.2 x 2.0 cm right frontal mass is unchanged, as is an adjacent 5 mm density anteroinferior to the mass which could reflect a small focus of  hemorrhage as suggested on the prior CT or instead may represent the inferior most portion of the mass. Vasogenic edema surrounding the right frontal lobe mass is similar to the prior study.  Hypoattenuation involving cortex and white matter in the medial left parietal lobe is again seen although this is slightly less prominent than on the prior CT. Hypoattenuation in the region of the left hippocampal tail similar to the prior study. The visualized paranasal sinuses and mastoid air cells are clear. Orbits are unremarkable.  IMPRESSION: 1. Evolving postoperative changes in the right temporoparietal region with decreased edema and midline shift. 2. Unchanged appearance of right frontal mass and surrounding edema. 3. Trace blood in the occipital horn of the left lateral ventricle. 4. Stable to slightly decreased conspicuity of medial left parietal hypoattenuation, which could reflect ischemia.   Electronically Signed   By: Logan Bores   On: 11/20/2013 08:33   Dg Chest Port 1 View  11/19/2013    CLINICAL DATA:  Weakness, hypoxia, and shortness of breath, history of tobacco use  EXAM: PORTABLE CHEST - 1 VIEW  COMPARISON:  DG CHEST 1V PORT dated 11/16/2013  FINDINGS: The lungs remain hyperinflated. There is no focal infiltrate. The interstitial markings are mildly prominent diffusely but not greatly changed. The cardiac silhouette is normal in size. The pulmonary vascularity is not engorged. The mediastinum is normal in width. The proximal port of the esophagogastric tube is at the GE junction and advancement by 5-10 cm is recommended.  IMPRESSION: There is hyperinflation consistent with COPD. Mild prominence of the interstitial markings may reflect minimal interstitial edema or interstitial pneumonia. There is no alveolar pneumonia nor pleural effusion.   Electronically Signed   By: David  Martinique   On: 11/19/2013 09:09    Scheduled Meds: . antiseptic oral rinse  15 mL Mouth Rinse QID  . chlorhexidine  15 mL Mouth/Throat BID  . dexamethasone  10 mg Intravenous 4 times per day  . insulin aspart  0-15 Units Subcutaneous 6 times per day  . levETIRAcetam  500 mg Per Tube BID  . multivitamin  5 mL Per Tube Daily  . piperacillin-tazobactam (ZOSYN)  IV  3.375 g Intravenous Q8H  . ranitidine  150 mg Per Tube BID  . vancomycin  750 mg Intravenous Q12H   Continuous Infusions: . sodium chloride 10 mL/hr at 11/20/13 1800  . 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 If 7PM-7AM, please contact night-coverage at [email or web address], password [facility] 11/21/2013,  8:46 AM  LOS: 10 days

## 2013-11-21 NOTE — Progress Notes (Signed)
Paged Dr. Christella Noa concerning patient leaking from site and husband wanting to know if a Dr was coming to see her. Dr. Christella Noa stated that Dr. Ellene Route will be in tomorrow and just to continue to monitor the patient. Patient and husband aware. Wants to wait to sign consent until they speak with the physician. Will continue to monitor per shift.

## 2013-11-21 NOTE — Progress Notes (Signed)
Pt transfering to [place] room 16 report called to [employer]

## 2013-11-21 NOTE — Progress Notes (Signed)
SLP Cancellation Note  Patient Details Name: Lanaiya Lantry MRN: [id number] DOB: 04/05/1951   Cancelled treatment:       Reason Eval/Treat Not Completed: Medical issues which prohibited therapy (pt for transfer to [facility] for OR management of CSF leak per RN notes)   Claudie Fisherman, [place] [facility] SLP [phone]

## 2013-11-21 NOTE — Progress Notes (Signed)
Per further nsg notes: "CSF leak transferring to [facility] to OR in am".  Pt not medically stable for PT at this time.  PT to sign off.  Please re-order once pt medically appropriate.  Thanks.  Carmelia Bake, PT, DPT 11/21/2013 Pager: [phone]

## 2013-11-21 NOTE — Plan of Care (Signed)
Problem: Phase II Progression Outcomes Goal: Progress activity as tolerated unless otherwise ordered Outcome: Not Progressing CSF leak transferring to [facility] to OR in am

## 2013-11-22 ENCOUNTER — Encounter (HOSPITAL_COMMUNITY)
Admission: EM | Disposition: A | Discharge status: Rehabilitation Facility | Payer: Self-pay | Source: Other Acute Inpatient Hospital | Attending: Hematology & Oncology

## 2013-11-22 ENCOUNTER — Inpatient Hospital Stay (HOSPITAL_COMMUNITY): Payer: BC Managed Care – PPO | Admitting: Anesthesiology

## 2013-11-22 ENCOUNTER — Encounter (HOSPITAL_COMMUNITY): Payer: Self-pay | Admitting: Anesthesiology

## 2013-11-22 ENCOUNTER — Encounter (HOSPITAL_COMMUNITY): Payer: BC Managed Care – PPO | Admitting: Anesthesiology

## 2013-11-22 DIAGNOSIS — C7931 Secondary malignant neoplasm of brain: Secondary | ICD-10-CM | POA: Diagnosis present

## 2013-11-22 DIAGNOSIS — W19XXXA Unspecified fall, initial encounter: Secondary | ICD-10-CM

## 2013-11-22 HISTORY — PX: CRANIOTOMY: SHX93

## 2013-11-22 LAB — CBC
HCT: 38.4 % (ref 36.0–46.0)
Hemoglobin: 13 g/dL (ref 12.0–15.0)
MCH: 31.7 pg (ref 26.0–34.0)
MCHC: 33.9 g/dL (ref 30.0–36.0)
MCV: 93.7 fL (ref 78.0–100.0)
PLATELETS: 330 10*3/uL (ref 150–400)
RBC: 4.1 MIL/uL (ref 3.87–5.11)
RDW: 13.1 % (ref 11.5–15.5)
WBC: 19.2 10*3/uL — ABNORMAL HIGH (ref 4.0–10.5)

## 2013-11-22 LAB — GLUCOSE, CAPILLARY
Glucose-Capillary: 146 mg/dL — ABNORMAL HIGH (ref 70–99)
Glucose-Capillary: 92 mg/dL (ref 70–99)
Glucose-Capillary: 117 mg/dL — ABNORMAL HIGH (ref 70–99)
Glucose-Capillary: 126 mg/dL — ABNORMAL HIGH (ref 70–99)

## 2013-11-22 LAB — BASIC METABOLIC PANEL
BUN: 38 mg/dL — ABNORMAL HIGH (ref 6–23)
CALCIUM: 8.6 mg/dL (ref 8.4–10.5)
CO2: 26 mEq/L (ref 19–32)
Chloride: 98 mEq/L (ref 96–112)
Creatinine, Ser: 0.51 mg/dL (ref 0.50–1.10)
GFR calc Af Amer: >90 mL/min (ref >90)
GLUCOSE: 149 mg/dL — AB (ref 70–99)
Potassium: 4.2 mEq/L (ref 3.7–5.3)
Sodium: 135 mEq/L — ABNORMAL LOW (ref 137–147)

## 2013-11-22 LAB — LACTATE DEHYDROGENASE: LDH: 360 U/L — AB (ref 94–250)

## 2013-11-22 SURGERY — CRANIOTOMY HEMATOMA EVACUATION SUBDURAL
Anesthesia: General | Site: Head | Laterality: Right

## 2013-11-22 MED ORDER — PHENYLEPHRINE 40 MCG/ML (10ML) SYRINGE FOR IV PUSH (FOR BLOOD PRESSURE SUPPORT)
PREFILLED_SYRINGE | INTRAVENOUS | Status: AC
  Filled 2013-11-22: qty 10

## 2013-11-22 MED ORDER — MORPHINE SULFATE 2 MG/ML IJ SOLN
2.0000 mg | INTRAMUSCULAR | Status: DC | PRN
  Administered 2013-11-22: 2 mg via INTRAVENOUS
  Filled 2013-11-22: qty 1

## 2013-11-22 MED ORDER — PROPOFOL 10 MG/ML IV BOLUS
INTRAVENOUS | Status: AC
  Filled 2013-11-22: qty 20

## 2013-11-22 MED ORDER — CEFAZOLIN SODIUM-DEXTROSE 2-3 GM-% IV SOLR
INTRAVENOUS | Status: DC | PRN
  Administered 2013-11-22 (×2): 2 g via INTRAVENOUS

## 2013-11-22 MED ORDER — ATORVASTATIN CALCIUM 10 MG PO TABS
10.0000 mg | ORAL_TABLET | Freq: Every evening | ORAL | Status: DC
  Administered 2013-11-22 – 2013-12-05 (×14): 10 mg via ORAL
  Filled 2013-11-22 (×16): qty 1

## 2013-11-22 MED ORDER — 0.9 % SODIUM CHLORIDE (POUR BTL) OPTIME
TOPICAL | Status: DC | PRN
  Administered 2013-11-22 (×2): 1000 mL

## 2013-11-22 MED ORDER — LIDOCAINE HCL (CARDIAC) 20 MG/ML IV SOLN
INTRAVENOUS | Status: AC
  Filled 2013-11-22: qty 5

## 2013-11-22 MED ORDER — SODIUM CHLORIDE 0.9 % IR SOLN
Status: DC | PRN
  Administered 2013-11-22: 14:00:00

## 2013-11-22 MED ORDER — SUCCINYLCHOLINE CHLORIDE 20 MG/ML IJ SOLN
INTRAMUSCULAR | Status: DC | PRN
  Administered 2013-11-22: 100 mg via INTRAVENOUS

## 2013-11-22 MED ORDER — ONDANSETRON HCL 4 MG/2ML IJ SOLN
4.0000 mg | INTRAMUSCULAR | Status: DC | PRN
Start: 2013-11-22 — End: 2013-12-06

## 2013-11-22 MED ORDER — PROMETHAZINE HCL 25 MG PO TABS
12.5000 mg | ORAL_TABLET | ORAL | Status: DC | PRN
Start: 2013-11-22 — End: 2013-12-03

## 2013-11-22 MED ORDER — SUCCINYLCHOLINE CHLORIDE 20 MG/ML IJ SOLN
INTRAMUSCULAR | Status: AC
  Filled 2013-11-22: qty 1

## 2013-11-22 MED ORDER — CYANOCOBALAMIN 500 MCG PO TABS
500.0000 ug | ORAL_TABLET | Freq: Every day | ORAL | Status: DC
  Administered 2013-11-22 – 2013-12-06 (×15): 500 ug via ORAL
  Filled 2013-11-22 (×16): qty 1

## 2013-11-22 MED ORDER — CEFAZOLIN SODIUM 1-5 GM-% IV SOLN
1.0000 g | Freq: Three times a day (TID) | INTRAVENOUS | Status: AC
  Administered 2013-11-22 – 2013-11-23 (×2): 1 g via INTRAVENOUS
  Filled 2013-11-22 (×2): qty 50

## 2013-11-22 MED ORDER — EPHEDRINE SULFATE 50 MG/ML IJ SOLN
INTRAMUSCULAR | Status: AC
  Filled 2013-11-22: qty 1

## 2013-11-22 MED ORDER — LABETALOL HCL 5 MG/ML IV SOLN
10.0000 mg | INTRAVENOUS | Status: DC | PRN
  Filled 2013-11-22: qty 8

## 2013-11-22 MED ORDER — PROPOFOL 10 MG/ML IV BOLUS
INTRAVENOUS | Status: DC | PRN
  Administered 2013-11-22: 150 mg via INTRAVENOUS

## 2013-11-22 MED ORDER — ONDANSETRON HCL 4 MG/2ML IJ SOLN
INTRAMUSCULAR | Status: DC | PRN
  Administered 2013-11-22: 4 mg via INTRAVENOUS

## 2013-11-22 MED ORDER — LEVETIRACETAM 500 MG/5ML IV SOLN
500.0000 mg | Freq: Two times a day (BID) | INTRAVENOUS | Status: DC
  Administered 2013-11-22 – 2013-12-02 (×20): 500 mg via INTRAVENOUS
  Filled 2013-11-22 (×22): qty 5

## 2013-11-22 MED ORDER — THROMBIN 20000 UNITS EX SOLR
CUTANEOUS | Status: DC | PRN
  Administered 2013-11-22: 13:00:00 via TOPICAL

## 2013-11-22 MED ORDER — FENTANYL CITRATE 0.05 MG/ML IJ SOLN
INTRAMUSCULAR | Status: AC
Start: 2013-11-22 — End: 2013-11-22
  Filled 2013-11-22: qty 5

## 2013-11-22 MED ORDER — LIDOCAINE HCL (CARDIAC) 20 MG/ML IV SOLN
INTRAVENOUS | Status: DC | PRN
  Administered 2013-11-22: 80 mg via INTRAVENOUS

## 2013-11-22 MED ORDER — PANTOPRAZOLE SODIUM 40 MG IV SOLR
40.0000 mg | Freq: Every day | INTRAVENOUS | Status: DC
  Administered 2013-11-22 – 2013-11-25 (×4): 40 mg via INTRAVENOUS
  Filled 2013-11-22 (×5): qty 40

## 2013-11-22 MED ORDER — ONDANSETRON HCL 4 MG/2ML IJ SOLN: 4.0000 mg | Freq: Once | INTRAMUSCULAR | Status: DC | PRN

## 2013-11-22 MED ORDER — ESCITALOPRAM OXALATE 10 MG PO TABS
10.0000 mg | ORAL_TABLET | Freq: Every day | ORAL | Status: DC
  Administered 2013-11-22 – 2013-12-06 (×15): 10 mg via ORAL
  Filled 2013-11-22 (×16): qty 1

## 2013-11-22 MED ORDER — FENTANYL CITRATE 0.05 MG/ML IJ SOLN
25.0000 ug | INTRAMUSCULAR | Status: DC | PRN
Start: 2013-11-22 — End: 2013-11-22

## 2013-11-22 MED ORDER — ROCURONIUM BROMIDE 50 MG/5ML IV SOLN
INTRAVENOUS | Status: AC
  Filled 2013-11-22: qty 1

## 2013-11-22 MED ORDER — ONDANSETRON HCL 4 MG PO TABS
4.0000 mg | ORAL_TABLET | ORAL | Status: DC | PRN
Start: 2013-11-22 — End: 2013-12-06

## 2013-11-22 MED ORDER — FENTANYL CITRATE 0.05 MG/ML IJ SOLN
INTRAMUSCULAR | Status: DC | PRN
  Administered 2013-11-22 (×2): 50 ug via INTRAVENOUS

## 2013-11-22 MED ORDER — CEFAZOLIN SODIUM-DEXTROSE 2-3 GM-% IV SOLR
INTRAVENOUS | Status: AC
  Filled 2013-11-22: qty 50

## 2013-11-22 MED ORDER — SODIUM CHLORIDE 0.9 % IV SOLN
INTRAVENOUS | Status: DC | PRN
  Administered 2013-11-22: 13:00:00 via INTRAVENOUS

## 2013-11-22 SURGICAL SUPPLY — 74 items
BAG DECANTER FOR FLEXI CONT (MISCELLANEOUS) ×3 IMPLANT
BANDAGE GAUZE ELAST BULKY 4 IN (GAUZE/BANDAGES/DRESSINGS) IMPLANT
BIT DRILL WIRE PASS 1.3MM (BIT) IMPLANT
BNDG GAUZE ELAST 4 BULKY (GAUZE/BANDAGES/DRESSINGS) ×6 IMPLANT
BRUSH SCRUB EZ PLAIN DRY (MISCELLANEOUS) ×3 IMPLANT
BUR ACORN 6.0 (BURR) IMPLANT
BUR ACORN 6.0MM (BURR)
BUR ADDG 1.1 (BURR) IMPLANT
BUR ADDG 1.1MM (BURR)
BUR ROUTER D-58 CRANI (BURR) IMPLANT
CANISTER SUCT 3000ML (MISCELLANEOUS) ×3 IMPLANT
CLIP TI MEDIUM 6 (CLIP) IMPLANT
CONT SPEC 4OZ CLIKSEAL STRL BL (MISCELLANEOUS) ×3 IMPLANT
CORDS BIPOLAR (ELECTRODE) ×3 IMPLANT
DECANTER SPIKE VIAL GLASS SM (MISCELLANEOUS) IMPLANT
DRAIN CHANNEL 10M FLAT 3/4 FLT (DRAIN) IMPLANT
DRAIN PENROSE 1/2X12 LTX STRL (WOUND CARE) IMPLANT
DRAPE SURG IRRIG POUCH 19X23 (DRAPES) IMPLANT
DRAPE WARM FLUID 44X44 (DRAPE) ×3 IMPLANT
DRILL WIRE PASS 1.3MM (BIT)
DRSG ADAPTIC 3X8 NADH LF (GAUZE/BANDAGES/DRESSINGS) IMPLANT
DURAPREP 6ML APPLICATOR 50/CS (WOUND CARE) IMPLANT
ELECT CAUTERY BLADE 6.4 (BLADE) IMPLANT
ELECT REM PT RETURN 9FT ADLT (ELECTROSURGICAL) ×3
ELECTRODE REM PT RTRN 9FT ADLT (ELECTROSURGICAL) ×1 IMPLANT
EVACUATOR 1/8 PVC DRAIN (DRAIN) ×3 IMPLANT
EVACUATOR SILICONE 100CC (DRAIN) IMPLANT
GAUZE SPONGE 4X4 16PLY XRAY LF (GAUZE/BANDAGES/DRESSINGS) IMPLANT
GLOVE BIO SURGEON STRL SZ7.5 (GLOVE) IMPLANT
GLOVE BIOGEL PI IND STRL 7.5 (GLOVE) ×1 IMPLANT
GLOVE BIOGEL PI IND STRL 8.5 (GLOVE) ×1 IMPLANT
GLOVE BIOGEL PI INDICATOR 7.5 (GLOVE) ×2
GLOVE BIOGEL PI INDICATOR 8.5 (GLOVE) ×2
GLOVE ECLIPSE 8.5 STRL (GLOVE) ×3 IMPLANT
GLOVE EXAM NITRILE LRG STRL (GLOVE) IMPLANT
GLOVE EXAM NITRILE MD LF STRL (GLOVE) IMPLANT
GLOVE EXAM NITRILE XL STR (GLOVE) IMPLANT
GLOVE EXAM NITRILE XS STR PU (GLOVE) IMPLANT
GLOVE SS N UNI LF 7.0 STRL (GLOVE) ×6 IMPLANT
GOWN BRE IMP SLV AUR LG STRL (GOWN DISPOSABLE) IMPLANT
GOWN BRE IMP SLV AUR XL STRL (GOWN DISPOSABLE) ×3 IMPLANT
GOWN STRL REIN 2XL LVL4 (GOWN DISPOSABLE) ×3 IMPLANT
GOWN STRL REUS W/ TWL XL LVL3 (GOWN DISPOSABLE) ×1 IMPLANT
GOWN STRL REUS W/TWL 2XL LVL3 (GOWN DISPOSABLE) ×3 IMPLANT
GOWN STRL REUS W/TWL XL LVL3 (GOWN DISPOSABLE) ×2
HEMOSTAT SURGICEL 2X14 (HEMOSTASIS) IMPLANT
HOOK DURA (MISCELLANEOUS) IMPLANT
KIT BASIN OR (CUSTOM PROCEDURE TRAY) ×3 IMPLANT
KIT ROOM TURNOVER OR (KITS) ×3 IMPLANT
NEEDLE HYPO 22GX1.5 SAFETY (NEEDLE) ×3 IMPLANT
NS IRRIG 1000ML POUR BTL (IV SOLUTION) ×6 IMPLANT
PACK CRANIOTOMY (CUSTOM PROCEDURE TRAY) ×3 IMPLANT
PAD ABD 8X10 STRL (GAUZE/BANDAGES/DRESSINGS) IMPLANT
PATTIES SURGICAL .5 X.5 (GAUZE/BANDAGES/DRESSINGS) IMPLANT
PATTIES SURGICAL .5 X3 (DISPOSABLE) IMPLANT
PATTIES SURGICAL 1X1 (DISPOSABLE) IMPLANT
PIN MAYFIELD SKULL DISP (PIN) IMPLANT
SPECIMEN JAR SMALL (MISCELLANEOUS) IMPLANT
SPONGE GAUZE 4X4 12PLY (GAUZE/BANDAGES/DRESSINGS) ×3 IMPLANT
SPONGE NEURO XRAY DETECT 1X3 (DISPOSABLE) IMPLANT
SPONGE SURGIFOAM ABS GEL 100 (HEMOSTASIS) IMPLANT
STAPLER SKIN PROX WIDE 3.9 (STAPLE) ×3 IMPLANT
SUT ETHILON 3 0 FSL (SUTURE) IMPLANT
SUT NURALON 4 0 TR CR/8 (SUTURE) ×6 IMPLANT
SUT VIC AB 2-0 CP2 18 (SUTURE) ×6 IMPLANT
SUT VIC AB 3-0 SH 8-18 (SUTURE) ×3 IMPLANT
SYR 20ML ECCENTRIC (SYRINGE) ×3 IMPLANT
SYR CONTROL 10ML LL (SYRINGE) ×3 IMPLANT
TOWEL OR 17X24 6PK STRL BLUE (TOWEL DISPOSABLE) ×3 IMPLANT
TOWEL OR 17X26 10 PK STRL BLUE (TOWEL DISPOSABLE) ×3 IMPLANT
TRAP SPECIMEN MUCOUS 40CC (MISCELLANEOUS) ×3 IMPLANT
TRAY FOLEY CATH 14FRSI W/METER (CATHETERS) IMPLANT
UNDERPAD 30X30 INCONTINENT (UNDERPADS AND DIAPERS) IMPLANT
WATER STERILE IRR 1000ML POUR (IV SOLUTION) ×3 IMPLANT

## 2013-11-22 NOTE — Anesthesia Procedure Notes (Signed)
Procedure Name: Intubation Date/Time: 11/22/2013 1:22 PM Performed by: Maude Leriche D Pre-anesthesia Checklist: Patient identified, Emergency Drugs available, Suction available, Patient being monitored and Timeout performed Patient Re-evaluated:Patient Re-evaluated prior to inductionOxygen Delivery Method: Circle system utilized Preoxygenation: Pre-oxygenation with 100% oxygen Intubation Type: IV induction Ventilation: Mask ventilation without difficulty Laryngoscope Size: Miller and 2 Grade View: Grade I Tube type: Oral Tube size: 7.0 mm Number of attempts: 1 Airway Equipment and Method: Stylet Placement Confirmation: ETT inserted through vocal cords under direct vision,  positive ETCO2 and breath sounds checked- equal and bilateral Secured at: 21 cm Tube secured with: Tape Dental Injury: Teeth and Oropharynx as per pre-operative assessment

## 2013-11-22 NOTE — Progress Notes (Signed)
Dr Ellene Route in / aware have emptied hemovac of 70mls fluid/ expects draining to stop/ will monitor/ aware no movement of bilat le's except accassional toes, nothing to command yet has purposeful ue movement bilat in reaching for turban as though to remove same/  His pinching of r trapezius elicited pronouced shrug and facial grimace

## 2013-11-22 NOTE — Progress Notes (Signed)
Appreciate notes re: CSF leak and planned revision. Will continue to hold on any RT planning.  When team feels patient is healed adequately and able to follow simple commands/ stay still for RT - please contact me to facilitate RT planning at [facility] (or referral to [facility] for whole brain RT, if patient ready for discharge and desiring outpatient treatment closer to home).  -----------------------------------  Eppie Gibson, MD

## 2013-11-22 NOTE — Progress Notes (Signed)
UR COMPLETED  

## 2013-11-22 NOTE — Transfer of Care (Signed)
Immediate Anesthesia Transfer of Care Note  Patient: Katie [name]  Procedure(s) Performed: Procedure(s) with comments: Revision of Right Parietal occipital wound due to CSF Leak (Right) - Revision of Right Parietal occipital wound due to CSF Leak  Patient Location: PACU  Anesthesia Type:General  Level of Consciousness: sedated  Airway & Oxygen Therapy: Patient Spontanous Breathing and Patient connected to face mask oxygen  Post-op Assessment: Report given to PACU RN and Post -op Vital signs reviewed and stable  Post vital signs: Reviewed and stable  Complications: No apparent anesthesia complications

## 2013-11-22 NOTE — Anesthesia Postprocedure Evaluation (Signed)
  Anesthesia Post-op Note  Patient: Katie [name]  Procedure(s) Performed: Procedure(s) with comments: Revision of Right Parietal occipital wound due to CSF Leak (Right) - Revision of Right Parietal occipital wound due to CSF Leak  Patient Location: PACU  Anesthesia Type:General  Level of Consciousness: awake, alert  and oriented  Airway and Oxygen Therapy: Patient Spontanous Breathing and Patient connected to nasal cannula oxygen  Post-op Pain: none  Post-op Assessment: Post-op Vital signs reviewed  Post-op Vital Signs: Reviewed  Complications: No apparent anesthesia complications

## 2013-11-22 NOTE — Progress Notes (Signed)
Pt went to OR for CSF leak revision.     .BP 134/64  Pulse 65  Temp(Src) 97.4 F (36.3 C) (Oral)  Resp 18  Ht 5\' 7"  (1.702 m)  Wt 56.2 kg (123 lb 14.4 oz)  BMI 19.40 kg/m2  SpO2 99%

## 2013-11-22 NOTE — Op Note (Signed)
Date of surgery: 11/22/2013 Preoperative diagnosis: CSF leak from right parietal incision on scalp Postoperative diagnosis: CSF leak from right parietal incision on scalp Procedure: Revision of incision with placement of subgaleal drain. Surgeon: Kristeen Miss Anesthesia: Gen. endotracheal Indications: Patient is a 63 year old individual who a little over week ago underwent surgical decompression of a right parietal brain tumor. She was herniating at the time. She has gradually recovered however she has had persistent drainage of CSF from the inferior margin of the incision. Revision of this incision is being performed now.  Procedure: The patient was brought to the operating room supine on a stretcher. After the smooth induction of general endotracheal anesthesia she was turned onto her left side and the head was turned to the left side to expose the right parietal incision. The incision was cleansed with Betadine scrub and solution and prepped sterilely. Staples were removed from the lower 4 cm of the incision. The inferior margin of the incision was opened. Serosanguineous spinal fluid was removed from this region. The galea was inspected. It is felt that a subgaleal drain would be required in order to allow the tissues of the incision to collect. A small Hemovac drain was then placed in the subgaleal space and brought out through a stab incision in the vertex of the scalp. This was trimmed to the appropriate length. The galea was then closed with interrupted 3-0 Vicryl sutures. A running nylon suture was used in the incision to close it. The drain was connected to a suction and he was secured in place with another 3-0 nylon suture. Blood loss for the procedure was nil. The patient tolerated the procedure well.

## 2013-11-22 NOTE — Progress Notes (Signed)
[facility] PROGRESS NOTE  Katie [name] LNL:[id number] DOB: April 15, 1951 DOA: 11/11/2013 PCP: Ezequiel Kayser, MD  Brief narrative:  Katie [name] is an 63 y.o. female with a PMH of tobacco abuse and hyperlipidemia who was admitted by neurosurgery on 11/11/13 after suffering a syncopal episode resulting in head trauma. A CT scan of the brain, done on admission, demonstrated 2 large right-sided lesions in the frontal and parietal/occipital area with 12 mm of shift and subfalcine herniation.  Patient underwent a right parietal/occipital craniotomy. Found to have CSF leak from bottom of incision that needs revision with drain put inplace. Neurosurgery requesting transfer to [place].  Pt was not examined by myself this AM as pt was already taken to surgery and is now in the ICU post-operatively. Prior notes reviewed and summarized below.  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 Ellene Route of neurosurgery and underwent a right parietal/occipital craniotomy with resection of brain tumor on 11/12/13. Pathologic diagnosis from the frozen section was obtained and showed malignant carcinoma. The patient was intubated postoperatively/successfully extubated 11/15/1498 the direction of pulmonology. Tube feeding is being provided through a NG tube. Patient underwent followup CT scanning of the brain on 11/13/13 which showed less shift and mass effect with no significant bleeding and tumor bed. She continues on Keppra and Decadron, with recommendations to slowly taper dexamethasone down to 2 mg twice a day, currently on 10mg  q6hrs - Rad/Onc had been following - Per nursing patient has had some draining from her suture site from craniotomy.  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  She is  receiving tube feeding.  Family reported significant weight loss since November (15 lbs). Likely from malignancy associated cachexia.   Acute respiratory failure  Resolved. Extubated 11/15/13 after being intubated x 3 days.  - chest x ray ordered which reported no alveolar pneumonia nor pleural effusion   Altered mental status  Secondary to brain metastasis and major surgery.  Leukocytosis - in context of recent operation.  Will leave broad spectrum antibiotics for now but will consider discontinuing if no source of infection found and patient's wbc trend down as well as patient remaining afebrile. - Possible persistent leukocytosis in the setting of high dose steroids  Small cell lung cancer  Dr. Marin Olp saw the patient in consultation on 11/15/13. CT scan of the chest and abdomen revealed a left lower lobe nodule and significant mediastinal adenopathy. Whole brain radiation was recommended, and therefore she was transferred to [facility].  - Management per oncology  HTN (hypertension)  Being managed with PRN labetalol.   Steroid-induced hyperglycemia  Continue SSI for hyperglycemia. CBGs 135-162.   DVT Prophylaxis  Continue SCDs.  Code Status: full Family Communication: Not seen as pt is in ICU Disposition Plan: Pending   Consultants:  Neurosurgery  Oncology  Procedures:  As listed above.  Antibiotics:  Zosyn and Vancomycin  HPI/Subjective: Pt is s/p surgery from this AM. Pt was not seen by me as she was taken to OR earlier this AM and is now in the ICU.  Objective: Filed Vitals:   11/22/13 1405  BP: 141/73  Pulse:   Temp: 98 F (36.7 C)  Resp:     Intake/Output Summary (Last 24 hours) at 11/22/13 1432 Last data filed at 11/22/13  1409  Gross per 24 hour  Intake    300 ml  Output   1085 ml  Net   -785 ml   Filed Weights   11/20/13 0400 11/21/13 0319 11/22/13 0514  Weight: 53.5 kg (117 lb 15.1 oz) 56.2 kg (123 lb 14.4 oz) 56.2 kg (123 lb 14.4 oz)     Exam:  Pt not seen as she had been taken to the OR this AM and is now in ICU   General:    Cardiovascular:   Respiratory:   Abdomen:   Musculoskeletal:   Data Reviewed: Basic Metabolic Panel:  Recent Labs Lab 11/17/13 0256 11/19/13 0320 11/20/13 0315 11/21/13 0320 11/22/13 0543  NA 138 136* 137 137 135*  K 4.4 4.4 4.1 4.1 4.2  CL 99 99 101 102 98  CO2 27 27 28 27 26   GLUCOSE 152* 174* 147* 123* 149*  BUN 29* 34* 40* 39* 38*  CREATININE 0.46* 0.46* 0.51 0.48* 0.51  CALCIUM 8.8 8.7 8.4 8.8 8.6   Liver Function Tests: No results found for this basename: AST, ALT, ALKPHOS, BILITOT, PROT, ALBUMIN,  in the last 168 hours No results found for this basename: LIPASE, AMYLASE,  in the last 168 hours No results found for this basename: AMMONIA,  in the last 168 hours CBC:  Recent Labs Lab 11/19/13 0320 11/20/13 0315 11/21/13 0320 11/22/13 0543  WBC 18.5* 17.9* 19.9* 19.2*  HGB 12.6 12.0 13.2 13.0  HCT 37.0 36.2 37.3 38.4  MCV 93.4 94.3 93.0 93.7  PLT 272 288 287 330   Cardiac Enzymes: No results found for this basename: CKTOTAL, CKMB, CKMBINDEX, TROPONINI,  in the last 168 hours BNP (last 3 results) No results found for this basename: PROBNP,  in the last 8760 hours CBG:  Recent Labs Lab 11/21/13 1959 11/21/13 2352 11/22/13 0417 11/22/13 0819 11/22/13 1150  GLUCAP 151* 139* 146* 92 117*    No results found for this or any previous visit (from the past 240 hour(s)).   Studies: No results found.  Scheduled Meds: . antiseptic oral rinse  15 mL Mouth Rinse QID  . chlorhexidine  15 mL Mouth/Throat BID  . dexamethasone  10 mg Intravenous 4 times per day  . insulin aspart  0-15 Units Subcutaneous 6 times per day  . levETIRAcetam  500 mg Per Tube BID  . multivitamin  5 mL Per Tube Daily  . piperacillin-tazobactam (ZOSYN)  IV  3.375 g Intravenous Q8H  . ranitidine  150 mg Per Tube BID  . vancomycin  750 mg Intravenous Q12H   Continuous  Infusions: . sodium chloride 20 mL/hr at 11/21/13 1726  . 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 If 7PM-7AM, please contact night-coverage at [email or web address], password [facility] 11/22/2013, 2:32 PM  LOS: 11 days

## 2013-11-22 NOTE — Anesthesia Preprocedure Evaluation (Signed)
Anesthesia Evaluation  Patient identified by MRN, date of birth, ID band Patient awake    Reviewed: Allergy & Precautions, H&P , NPO status , Patient's Chart, lab work & pertinent test results  Airway Mallampati: I TM Distance: >3 FB Neck ROM: Full    Dental  (+) Teeth Intact, Dental Advisory Given   Pulmonary Current Smoker,  breath sounds clear to auscultation        Cardiovascular hypertension, Pt. on medications Rhythm:Regular Rate:Normal     Neuro/Psych    GI/Hepatic   Endo/Other    Renal/GU      Musculoskeletal   Abdominal   Peds  Hematology   Anesthesia Other Findings   Reproductive/Obstetrics                           Anesthesia Physical Anesthesia Plan  ASA: III  Anesthesia Plan: General   Post-op Pain Management:    Induction: Intravenous  Airway Management Planned: Oral ETT  Additional Equipment: Arterial line  Intra-op Plan:   Post-operative Plan: Extubation in OR  Informed Consent: I have reviewed the patients History and Physical, chart, labs and discussed the procedure including the risks, benefits and alternatives for the proposed anesthesia with the patient or authorized representative who has indicated his/her understanding and acceptance.   Dental advisory given  Plan Discussed with: CRNA, Anesthesiologist and Surgeon  Anesthesia Plan Comments:         Anesthesia Quick Evaluation

## 2013-11-23 ENCOUNTER — Inpatient Hospital Stay (HOSPITAL_COMMUNITY): Payer: BC Managed Care – PPO

## 2013-11-23 LAB — BASIC METABOLIC PANEL
BUN: 29 mg/dL — AB (ref 6–23)
CALCIUM: 8.4 mg/dL (ref 8.4–10.5)
CO2: 26 mEq/L (ref 19–32)
Chloride: 98 mEq/L (ref 96–112)
Creatinine, Ser: 0.53 mg/dL (ref 0.50–1.10)
GFR calc Af Amer: >90 mL/min (ref >90)
Glucose, Bld: 103 mg/dL — ABNORMAL HIGH (ref 70–99)
POTASSIUM: 4.6 meq/L (ref 3.7–5.3)
SODIUM: 138 meq/L (ref 137–147)

## 2013-11-23 LAB — TRIGLYCERIDES: TRIGLYCERIDES: 50 mg/dL (ref <150)

## 2013-11-23 LAB — GLUCOSE, CAPILLARY
Glucose-Capillary: 100 mg/dL — ABNORMAL HIGH (ref 70–99)
Glucose-Capillary: 101 mg/dL — ABNORMAL HIGH (ref 70–99)
Glucose-Capillary: 123 mg/dL — ABNORMAL HIGH (ref 70–99)
Glucose-Capillary: 133 mg/dL — ABNORMAL HIGH (ref 70–99)
Glucose-Capillary: 134 mg/dL — ABNORMAL HIGH (ref 70–99)
Glucose-Capillary: 165 mg/dL — ABNORMAL HIGH (ref 70–99)
Glucose-Capillary: 93 mg/dL (ref 70–99)

## 2013-11-23 LAB — CBC
HCT: 38.8 % (ref 36.0–46.0)
Hemoglobin: 13.4 g/dL (ref 12.0–15.0)
MCH: 32.1 pg (ref 26.0–34.0)
MCHC: 34.5 g/dL (ref 30.0–36.0)
MCV: 93 fL (ref 78.0–100.0)
Platelets: 317 10*3/uL (ref 150–400)
RBC: 4.17 MIL/uL (ref 3.87–5.11)
RDW: 13 % (ref 11.5–15.5)
WBC: 26 10*3/uL — ABNORMAL HIGH (ref 4.0–10.5)

## 2013-11-23 LAB — VANCOMYCIN, TROUGH: VANCOMYCIN TR: 16.5 ug/mL (ref 10.0–20.0)

## 2013-11-23 LAB — LACTATE DEHYDROGENASE: LDH: 377 U/L — ABNORMAL HIGH (ref 94–250)

## 2013-11-23 MED ORDER — VANCOMYCIN HCL IN DEXTROSE 1-5 GM/200ML-% IV SOLN
1000.0000 mg | Freq: Two times a day (BID) | INTRAVENOUS | Status: DC
  Administered 2013-11-23 – 2013-11-25 (×5): 1000 mg via INTRAVENOUS
  Filled 2013-11-23 (×7): qty 200

## 2013-11-23 MED ORDER — VITAL HIGH PROTEIN PO LIQD
1000.0000 mL | ORAL | Status: DC
  Filled 2013-11-23 (×2): qty 1000

## 2013-11-23 MED ORDER — VITAL AF 1.2 CAL PO LIQD
1000.0000 mL | ORAL | Status: DC
  Administered 2013-11-24 – 2013-11-25 (×3): 1000 mL
  Filled 2013-11-23 (×7): qty 1000

## 2013-11-23 NOTE — Progress Notes (Addendum)
Pt has become more difficult to arouse and has an increase of slurred speech. Pt will no longer open her eyes to speech and intermittently to pain.  Paged Neuro Sx MD and spoke to Dr. Christella Noa and a Stat Head CT w/o contrast was ordered.

## 2013-11-23 NOTE — Progress Notes (Signed)
NUTRITION CONSULT/FOLLOW UP  DOCUMENTATION CODES Per approved criteria  -Moderate (non-severe) malnutrition in the context of chronic illness   INTERVENTION: Initiate Vital AF 1.2 formula at 25 ml/hr and increase by 10 ml every 4 hours to goal rate of 55 ml/hr to provide 1584 kcals, 99 gm protein, 1070 ml of free water Continue liquid MVI daily via tube RD to follow for nutrition care plan  NUTRITION DIAGNOSIS: Inadequate oral intake related to inability to eat as evidenced by NPO status, ongoing  Goal: Pt to meet >/= 90% of their estimated nutrition needs, currently unmet  Monitor:  TF regimen & tolerance, weight, labs, I/O's  ASSESSMENT: 63 y.o. F who apparently had a syncopal episode 3/23 falling and hitting her head in the left frontal region. A CT scan of the brain demonstrated at least 2 large right-sided lesions one frontally and one parietal occipitally there is 12 mm of shift with subfalcine herniation. She underwent right parietal occipital craniotomy and resection of brain tumor 3/24. PCCM consulted post op for vent management.  3/27 - Brain bx reveals HIGH GRADE POORLY DIFFERENTIATED NEUROENDOCRINE CARCINOMA, SMALL CELL TYPE  Patient s/p procedure 4/3: REVISION OF INCISION WITH PLACEMENT OF SUBGALEAL DRAIN  Patient transferred from 4N-Neuroscience to 86M-Neuro ICU 4/3 post-op.  NGT in place.  Previously receiving Pivot 1.5 formula prior to surgery.  Vital HP ordered per MD 4/4 -- awaiting formula to come up from Pharmacy.  Speech Path following for PO readiness and/or swallow evaluation.  RD consulted for TF initiation & management.  Height: Ht Readings from Last 1 Encounters:  11/22/13 5\' 7"  (1.702 m)    Weight: Wt Readings from Last 1 Encounters:  11/23/13 121 lb 7.6 oz (55.1 kg)  Admit wt 118 lb  BMI:  Body mass index is 19.02 kg/(m^2).   Estimated Nutritional Needs: Kcal: 1650-1850 Protein: 90-110 grams Fluid: 1.6-1.8 L  Skin: closed incision on head,  abrasion on left elbow, ecchymosis on left hip  Diet Order: NPO    Intake/Output Summary (Last 24 hours) at 11/23/13 1009 Last data filed at 11/23/13 0800  Gross per 24 hour  Intake   1660 ml  Output   1130 ml  Net    530 ml   Labs:   Recent Labs Lab 11/21/13 0320 11/22/13 0543 11/23/13 0316  NA 137 135* 138  K 4.1 4.2 4.6  CL 102 98 98  CO2 27 26 26   BUN 39* 38* 29*  CREATININE 0.48* 0.51 0.53  CALCIUM 8.8 8.6 8.4  GLUCOSE 123* 149* 103*    CBG (last 3)   Recent Labs  11/22/13 1609 11/22/13 1938 11/22/13 2356  GLUCAP 126* 93 123*    Scheduled Meds: . antiseptic oral rinse  15 mL Mouth Rinse QID  . atorvastatin  10 mg Oral QPM  . chlorhexidine  15 mL Mouth/Throat BID  . cyanocobalamin  500 mcg Oral Daily  . dexamethasone  10 mg Intravenous 4 times per day  . escitalopram  10 mg Oral Daily  . feeding supplement (VITAL HIGH PROTEIN)  1,000 mL Per Tube Q24H  . insulin aspart  0-15 Units Subcutaneous 6 times per day  . levETIRAcetam  500 mg Intravenous Q12H  . multivitamin  5 mL Per Tube Daily  . pantoprazole (PROTONIX) IV  40 mg Intravenous QHS  . piperacillin-tazobactam (ZOSYN)  IV  3.375 g Intravenous Q8H  . ranitidine  150 mg Per Tube BID  . vancomycin  1,000 mg Intravenous Q12H    Continuous  Infusions: . sodium chloride 20 mL/hr at 11/22/13 2000  . feeding supplement (PIVOT 1.5 CAL) Stopped (11/22/13 0630)    Arthur Holms, RD, LDN Pager #: [phone] After-Hours Pager #: [phone]

## 2013-11-23 NOTE — Progress Notes (Signed)
Subjective: Patient reports Lethargic does not follow commands grimaces to pain does not open eyes but moves all 4 extremities  Objective: Vital signs in last 24 hours: Temp:  [97.2 F (36.2 C)-98.1 F (36.7 C)] 97.7 F (36.5 C) (04/04 0800) Pulse Rate:  [57-76] 67 (04/04 0900) Resp:  [10-18] 15 (04/04 0900) BP: (113-145)/(53-73) 121/67 mmHg (04/04 0900) SpO2:  [95 %-100 %] 97 % (04/04 0900) FiO2 (%):  [28 %] 28 % (04/03 1400) Weight:  [54 kg (119 lb 0.8 oz)-55.1 kg (121 lb 7.6 oz)] 55.1 kg (121 lb 7.6 oz) (04/04 0429)  Intake/Output from previous day: 04/03 0701 - 04/04 0700 In: 1640 [I.V.:580; IV Piggyback:1060] Out: 1130 [Urine:825; Drains:295; Blood:10] Intake/Output this shift: Total I/O In: 20 [I.V.:20] Out: -   Patient is putting about 80 cc per shift output the drain. Most of the fluid is serous sanguinous. Neurologic exam is as noted above in the subjective portion.  Lab Results:  Recent Labs  11/22/13 0543 11/23/13 0316  WBC 19.2* 26.0*  HGB 13.0 13.4  HCT 38.4 38.8  PLT 330 317   BMET  Recent Labs  11/22/13 0543 11/23/13 0316  NA 135* 138  K 4.2 4.6  CL 98 98  CO2 26 26  GLUCOSE 149* 103*  BUN 38* 29*  CREATININE 0.51 0.53  CALCIUM 8.6 8.4    Studies/Results: Ct Head Wo Contrast  11/23/2013   CLINICAL DATA:  Altered mental status. Status post resection of brain tumors.  EXAM: CT HEAD WITHOUT CONTRAST  TECHNIQUE: Contiguous axial images were obtained from the base of the skull through the vertex without intravenous contrast.  COMPARISON:  Multiple prior CTs of the head, the most recent of which was performed 11/20/2013  FINDINGS: Suspected trace blood at the site of right temporoparietal mass resection is somewhat less apparent. A few small foci of increased attenuation at the operative site are nonspecific; trace residual mass cannot be excluded. Surrounding vasogenic edema is again seen. The 2.3 x 2.0 cm mass at the right frontal lobe is essentially  unchanged, with surrounding vasogenic edema. A tiny focus of increased attenuation more inferiorly may reflect slight nodular extension of the mass. There is mild mass effect due to vasogenic edema, with perhaps 4 mm of leftward midline shift, improved from prior studies.  No definite intraventricular blood is seen. No significant subdural or subarachnoid hemorrhage is identified. Trace residual pneumocephalus is seen at the surgical site.  The posterior fossa, including the cerebellum, brainstem and fourth ventricle, is within normal limits.  A right temporoparietal craniotomy flap is again noted, with overlying skin staples and drainage catheter. The visualized portions of the orbits are within normal limits. The paranasal sinuses and mastoid air cells are well-aerated. No significant soft tissue abnormalities are seen.  IMPRESSION: 1. Suspected trace blood at the site of right temporoparietal mass resection is somewhat less apparent. Few small foci of increased attenuation at the operative site are nonspecific; trace residual mass cannot be excluded. Surrounding vasogenic edema again seen. 2. 2.3 x 2.0 cm mass at the right frontal lobe is essentially unchanged, with surrounding vasogenic edema. Mild mass effect noted, with perhaps 4 mm of leftward midline shift, improved from prior studies. 3. Trace residual pneumocephalus noted at the surgical site.   Electronically Signed   By: Garald Balding M.D.   On: 11/23/2013 03:26    Assessment/Plan: CT scan is performed at 3 AM. This demonstrates that there is less shift and although subgaleal fluid is gone  as is the fluid within the surgical cavity.  LOS: 12 days  Plan is to continue the drain for now until it slows down a bit. Still allowed incision to heal. Neurologic improvement should follow.   Norbert Malkin J 11/23/2013, 10:08 AM

## 2013-11-23 NOTE — Progress Notes (Signed)
ANTIBIOTIC CONSULT NOTE   Pharmacy Consult for Vancomycin Indication: HCAP  No Known Allergies  Patient Measurements: Height: 5\' 7"  (170.2 cm) Weight: 121 lb 7.6 oz (55.1 kg) IBW/kg (Calculated) : 61.6 Adjusted Body Weight:   Vital Signs: Temp: 98.1 F (36.7 C) (04/04 0400) Temp src: Oral (04/04 0400) BP: 115/59 mmHg (04/04 0400) Pulse Rate: 57 (04/04 0400) Intake/Output from previous day: 04/03 0701 - 04/04 0700 In: 1620 [I.V.:560; IV Piggyback:1060] Out: 1130 [Urine:825; Drains:295; Blood:10] Intake/Output from this shift: Total I/O In: 901.7 [I.V.:196.7; IV Piggyback:705] Out: 805 [Urine:575; Drains:230]  Labs:  Recent Labs  11/21/13 0320 11/22/13 0543 11/23/13 0316  WBC 19.9* 19.2* 26.0*  HGB 13.2 13.0 13.4  PLT 287 330 317  CREATININE 0.48* 0.51 0.53   Estimated Creatinine Clearance: 61.8 ml/min (by C-G formula based on Cr of 0.53).  Recent Labs  11/23/13 0316  VANCOTROUGH 16.5     Assessment: 63 yo Female s/p brain tumor resection 3/24, s/p CSF drain placement 4/3, leukocytosis and possible PNA, for empiric antibiotics.  Trough this morning drawn more than 2 hrs early, and dose last night delayed nearly 2 hrs.  Actual trough likely 10-12 mcg/mL  Goal of Therapy:  Vancomycin trough level 15-20 mcg/ml  Plan:  Change vancomycin 1 g IV q12h  Phillis Knack, PharmD, BCPS

## 2013-11-24 LAB — GLUCOSE, CAPILLARY
GLUCOSE-CAPILLARY: 147 mg/dL — AB (ref 70–99)
Glucose-Capillary: 131 mg/dL — ABNORMAL HIGH (ref 70–99)
Glucose-Capillary: 131 mg/dL — ABNORMAL HIGH (ref 70–99)
Glucose-Capillary: 128 mg/dL — ABNORMAL HIGH (ref 70–99)

## 2013-11-24 LAB — BASIC METABOLIC PANEL
BUN: 38 mg/dL — AB (ref 6–23)
CHLORIDE: 101 meq/L (ref 96–112)
CO2: 27 mEq/L (ref 19–32)
CREATININE: 0.49 mg/dL — AB (ref 0.50–1.10)
Calcium: 8.1 mg/dL — ABNORMAL LOW (ref 8.4–10.5)
GFR calc non Af Amer: >90 mL/min (ref >90)
GLUCOSE: 138 mg/dL — AB (ref 70–99)
Potassium: 4.3 mEq/L (ref 3.7–5.3)
Sodium: 139 mEq/L (ref 137–147)

## 2013-11-24 LAB — CBC
HEMATOCRIT: 38.4 % (ref 36.0–46.0)
Hemoglobin: 13.2 g/dL (ref 12.0–15.0)
MCH: 32.1 pg (ref 26.0–34.0)
MCHC: 34.4 g/dL (ref 30.0–36.0)
MCV: 93.4 fL (ref 78.0–100.0)
Platelets: 295 10*3/uL (ref 150–400)
RBC: 4.11 MIL/uL (ref 3.87–5.11)
RDW: 13.1 % (ref 11.5–15.5)
WBC: 22 10*3/uL — ABNORMAL HIGH (ref 4.0–10.5)

## 2013-11-24 LAB — LACTATE DEHYDROGENASE: LDH: 283 U/L — ABNORMAL HIGH (ref 94–250)

## 2013-11-24 MED ORDER — WHITE PETROLATUM GEL
Status: AC
  Administered 2013-11-24: 19:00:00
  Filled 2013-11-24: qty 5

## 2013-11-24 NOTE — Progress Notes (Signed)
Subjective: Patient reports Patient is more alert talking in sentences though still somewhat confused.  Objective: Vital signs in last 24 hours: Temp:  [97.3 F (36.3 C)-98.9 F (37.2 C)] 98.3 F (36.8 C) (04/05 0818) Pulse Rate:  [54-72] 72 (04/05 1100) Resp:  [11-22] 22 (04/05 1100) BP: (101-124)/(54-74) 120/58 mmHg (04/05 1100) SpO2:  [95 %-100 %] 98 % (04/05 1100) Weight:  [54.3 kg (119 lb 11.4 oz)] 54.3 kg (119 lb 11.4 oz) (04/05 0335)  Intake/Output from previous day: 04/04 0701 - 04/05 0700 In: 2465 [I.V.:480; NG/GT:1025; IV Piggyback:960] Out: 2000 [Urine:1775; Drains:225] Intake/Output this shift: Total I/O In: 245 [I.V.:30; NG/GT:165; IV Piggyback:50] Out: 255 [Urine:195; Drains:60]  Subgaleal drain is putting out approximately 250 cc of serosanguineous fluid.  Lab Results:  Recent Labs  11/23/13 0316 11/24/13 0234  WBC 26.0* 22.0*  HGB 13.4 13.2  HCT 38.8 38.4  PLT 317 295   BMET  Recent Labs  11/23/13 0316 11/24/13 0234  NA 138 139  K 4.6 4.3  CL 98 101  CO2 26 27  GLUCOSE 103* 138*  BUN 29* 38*  CREATININE 0.53 0.49*  CALCIUM 8.4 8.1*    Studies/Results: Ct Head Wo Contrast  11/23/2013   CLINICAL DATA:  Altered mental status. Status post resection of brain tumors.  EXAM: CT HEAD WITHOUT CONTRAST  TECHNIQUE: Contiguous axial images were obtained from the base of the skull through the vertex without intravenous contrast.  COMPARISON:  Multiple prior CTs of the head, the most recent of which was performed 11/20/2013  FINDINGS: Suspected trace blood at the site of right temporoparietal mass resection is somewhat less apparent. A few small foci of increased attenuation at the operative site are nonspecific; trace residual mass cannot be excluded. Surrounding vasogenic edema is again seen. The 2.3 x 2.0 cm mass at the right frontal lobe is essentially unchanged, with surrounding vasogenic edema. A tiny focus of increased attenuation more inferiorly may  reflect slight nodular extension of the mass. There is mild mass effect due to vasogenic edema, with perhaps 4 mm of leftward midline shift, improved from prior studies.  No definite intraventricular blood is seen. No significant subdural or subarachnoid hemorrhage is identified. Trace residual pneumocephalus is seen at the surgical site.  The posterior fossa, including the cerebellum, brainstem and fourth ventricle, is within normal limits.  A right temporoparietal craniotomy flap is again noted, with overlying skin staples and drainage catheter. The visualized portions of the orbits are within normal limits. The paranasal sinuses and mastoid air cells are well-aerated. No significant soft tissue abnormalities are seen.  IMPRESSION: 1. Suspected trace blood at the site of right temporoparietal mass resection is somewhat less apparent. Few small foci of increased attenuation at the operative site are nonspecific; trace residual mass cannot be excluded. Surrounding vasogenic edema again seen. 2. 2.3 x 2.0 cm mass at the right frontal lobe is essentially unchanged, with surrounding vasogenic edema. Mild mass effect noted, with perhaps 4 mm of leftward midline shift, improved from prior studies. 3. Trace residual pneumocephalus noted at the surgical site.   Electronically Signed   By: Garald Balding M.D.   On: 11/23/2013 03:26    Assessment/Plan: Patient has been afebrile. Drain in place.  LOS: 13 days  Continue subgaleal drain and leave in intensive care unit for now.   Mylez Venable J 11/24/2013, 11:43 AM

## 2013-11-25 ENCOUNTER — Encounter (HOSPITAL_COMMUNITY): Payer: Self-pay | Admitting: Neurological Surgery

## 2013-11-25 LAB — GLUCOSE, CAPILLARY
GLUCOSE-CAPILLARY: 138 mg/dL — AB (ref 70–99)
Glucose-Capillary: 149 mg/dL — ABNORMAL HIGH (ref 70–99)
Glucose-Capillary: 153 mg/dL — ABNORMAL HIGH (ref 70–99)
Glucose-Capillary: 141 mg/dL — ABNORMAL HIGH (ref 70–99)
Glucose-Capillary: 145 mg/dL — ABNORMAL HIGH (ref 70–99)
Glucose-Capillary: 122 mg/dL — ABNORMAL HIGH (ref 70–99)
Glucose-Capillary: 103 mg/dL — ABNORMAL HIGH (ref 70–99)
Glucose-Capillary: 172 mg/dL — ABNORMAL HIGH (ref 70–99)

## 2013-11-25 LAB — BASIC METABOLIC PANEL
BUN: 36 mg/dL — AB (ref 6–23)
CALCIUM: 8 mg/dL — AB (ref 8.4–10.5)
CHLORIDE: 100 meq/L (ref 96–112)
CO2: 24 meq/L (ref 19–32)
CREATININE: 0.43 mg/dL — AB (ref 0.50–1.10)
GFR calc Af Amer: >90 mL/min (ref >90)
GFR calc non Af Amer: >90 mL/min (ref >90)
GLUCOSE: 92 mg/dL (ref 70–99)
Potassium: 4.2 mEq/L (ref 3.7–5.3)
Sodium: 136 mEq/L — ABNORMAL LOW (ref 137–147)

## 2013-11-25 LAB — CBC
HCT: 35.9 % — ABNORMAL LOW (ref 36.0–46.0)
Hemoglobin: 12.3 g/dL (ref 12.0–15.0)
MCH: 31.7 pg (ref 26.0–34.0)
MCHC: 34.3 g/dL (ref 30.0–36.0)
MCV: 92.5 fL (ref 78.0–100.0)
PLATELETS: 286 10*3/uL (ref 150–400)
RBC: 3.88 MIL/uL (ref 3.87–5.11)
RDW: 13 % (ref 11.5–15.5)
WBC: 22.4 10*3/uL — AB (ref 4.0–10.5)

## 2013-11-25 LAB — CLOSTRIDIUM DIFFICILE BY PCR: CDIFFPCR: NEGATIVE

## 2013-11-25 LAB — LACTATE DEHYDROGENASE: LDH: 298 U/L — ABNORMAL HIGH (ref 94–250)

## 2013-11-25 LAB — VANCOMYCIN, TROUGH: VANCOMYCIN TR: 12.7 ug/mL (ref 10.0–20.0)

## 2013-11-25 MED ORDER — VANCOMYCIN HCL 10 G IV SOLR
1250.0000 mg | Freq: Two times a day (BID) | INTRAVENOUS | Status: DC
  Administered 2013-11-26 – 2013-11-28 (×5): 1250 mg via INTRAVENOUS
  Filled 2013-11-25 (×6): qty 1250

## 2013-11-25 MED ORDER — VANCOMYCIN HCL IN DEXTROSE 1-5 GM/200ML-% IV SOLN
1000.0000 mg | Freq: Once | INTRAVENOUS | Status: AC
  Administered 2013-11-25: 1000 mg via INTRAVENOUS
  Filled 2013-11-25: qty 200

## 2013-11-25 NOTE — Progress Notes (Signed)
UR completed.  Malon Branton, RN BSN M[facility] CCM Trauma/Neuro ICU Case Manager [phone]  

## 2013-11-25 NOTE — Progress Notes (Signed)
Speech Language Pathology Treatment: Dysphagia  Patient Details Name: Katie [name] MRN: [id number] DOB: 1951/04/03 Today's Date: 11/25/2013 Time: 7262-0355 SLP Time Calculation (min): 35 min  Assessment / Plan / Recommendation Clinical Impression  Patient remains confused, but appears to be more alert than previously described.  Pt. Appears to tolerate purees and nectar thick liquids without overt s/s of aspiration, but continues to cough after sips of water.  Pt. Has large bore NG tube, which may have a negative impact on swallow function.  Recommend NG be removed prior to [facility] for objective evaluation of swallow.  If TF's are still needed after the MBS, recommend a Panda tube be placed.  Discussed with RN, who will request orders from MD.   HPI HPI: 63 year old female admitted 11/11/13 for craniotomy and resection of right fronto-parietal tumor.  PMH insignificant.  Pt also has LLL mass c/w tumor.  Pt was extubated 3/27 and BSE completed this weekend with recommendation to continue npo with slp follow up.  Pt has tube feeding for nutrition.     Pertinent Vitals n/a  SLP Plan  MBS;New goals to be determined pending instrumental study    Recommendations Diet recommendations: NPO Medication Administration: Via alternative means              Oral Care Recommendations: Oral care Q4 per protocol Follow up Recommendations: 24 hour supervision/assistance;Skilled Nursing facility Plan: MBS;New goals to be determined pending instrumental study    GO     Quinn Axe T 11/25/2013, 12:57 PM

## 2013-11-25 NOTE — Progress Notes (Signed)
ANTIBIOTIC CONSULT NOTE - FOLLOW UP  Pharmacy Consult for vancomycin Indication: HCAP  No Known Allergies  Patient Measurements: Height: 5\' 7"  (170.2 cm) Weight: 119 lb 11.4 oz (54.3 kg) IBW/kg (Calculated) : 61.6  Vital Signs: Temp: 98.4 F (36.9 C) (04/06 1957) Temp src: Oral (04/06 1957) BP: 124/64 mmHg (04/06 1800) Pulse Rate: 59 (04/06 1800) Intake/Output from previous day: 04/05 0701 - 04/06 0700 In: 2520 [I.V.:310; NG/GT:1320; IV Piggyback:760] Out: 738 [Urine:595; Drains:140; Stool:3] Intake/Output from this shift:    Labs:  Recent Labs  11/23/13 0316 11/24/13 0234 11/25/13 0257  WBC 26.0* 22.0* 22.4*  HGB 13.4 13.2 12.3  PLT 317 295 286  CREATININE 0.53 0.49* 0.43*   Estimated Creatinine Clearance: 60.9 ml/min (by C-G formula based on Cr of 0.43).  Recent Labs  11/23/13 0316 11/25/13 1840  VANCOTROUGH 16.5 12.7     Microbiology: Recent Results (from the past 720 hour(s))  MRSA PCR SCREENING     Status: None   Collection Time    11/11/13  9:02 PM      Result Value Ref Range Status   MRSA by PCR NEGATIVE  NEGATIVE Final   Comment:            The GeneXpert MRSA Assay (FDA     approved for NASAL specimens     only), is one component of a     comprehensive MRSA colonization     surveillance program. It is not     intended to diagnose MRSA     infection nor to guide or     monitor treatment for     MRSA infections.  CLOSTRIDIUM DIFFICILE BY PCR     Status: None   Collection Time    11/25/13  6:24 AM      Result Value Ref Range Status   C difficile by pcr NEGATIVE  NEGATIVE Final    Anti-infectives   Start     Dose/Rate Route Frequency Ordered Stop   11/23/13 0600  vancomycin (VANCOCIN) IVPB 1000 mg/200 mL premix     1,000 mg 200 mL/hr over 60 Minutes Intravenous Every 12 hours 11/23/13 0442     11/22/13 2100  ceFAZolin (ANCEF) IVPB 1 g/50 mL premix     1 g 100 mL/hr over 30 Minutes Intravenous Every 8 hours 11/22/13 1502 11/23/13 0449   11/22/13 1336  bacitracin 50,000 Units in sodium chloride irrigation 0.9 % 500 mL irrigation  Status:  Discontinued       As needed 11/22/13 1336 11/22/13 1400   11/20/13 0600  vancomycin (VANCOCIN) IVPB 750 mg/150 ml premix  Status:  Discontinued     750 mg 150 mL/hr over 60 Minutes Intravenous Every 12 hours 11/19/13 1706 11/23/13 0442   11/19/13 1800  piperacillin-tazobactam (ZOSYN) IVPB 3.375 g     3.375 g 12.5 mL/hr over 240 Minutes Intravenous Every 8 hours 11/19/13 1706     11/19/13 1730  vancomycin (VANCOCIN) 1,250 mg in sodium chloride 0.9 % 250 mL IVPB     1,250 mg 166.7 mL/hr over 90 Minutes Intravenous  Once 11/19/13 1706 11/19/13 1910   11/12/13 0655  bacitracin 50,000 Units in sodium chloride irrigation 0.9 % 500 mL irrigation  Status:  Discontinued       As needed 11/12/13 0709 11/12/13 1610      Assessment: 63 yo Female s/p brain tumor resection 3/24, s/p CSF drain placement 4/3, leukocytosis and possible PNA on vancomycin and zosyn. A vancomycin trough was drawn and found  to be 12.7 (done at about 6:30pm)  Vanc 3/31 >> * 4/4 VT=16.5  (level drawn 2.5 hrs early)   4/6 CDiff PCR >> neg   Goal of Therapy:  Vancomycin trough level 15-20 mcg/ml  Plan: -Change vancomycin to 1250mg  IV q12h -Will follow renal function and clinical progress  Hildred Laser, Pharm D 11/25/2013 8:10 PM

## 2013-11-26 ENCOUNTER — Inpatient Hospital Stay (HOSPITAL_COMMUNITY): Payer: BC Managed Care – PPO

## 2013-11-26 LAB — CBC
HCT: 34.6 % — ABNORMAL LOW (ref 36.0–46.0)
Hemoglobin: 11.7 g/dL — ABNORMAL LOW (ref 12.0–15.0)
MCH: 31.7 pg (ref 26.0–34.0)
MCHC: 33.8 g/dL (ref 30.0–36.0)
MCV: 93.8 fL (ref 78.0–100.0)
Platelets: 264 10*3/uL (ref 150–400)
RBC: 3.69 MIL/uL — ABNORMAL LOW (ref 3.87–5.11)
RDW: 13.2 % (ref 11.5–15.5)
WBC: 20.1 10*3/uL — ABNORMAL HIGH (ref 4.0–10.5)

## 2013-11-26 LAB — BASIC METABOLIC PANEL
BUN: 33 mg/dL — ABNORMAL HIGH (ref 6–23)
CHLORIDE: 100 meq/L (ref 96–112)
CO2: 27 meq/L (ref 19–32)
Calcium: 8.4 mg/dL (ref 8.4–10.5)
Creatinine, Ser: 0.44 mg/dL — ABNORMAL LOW (ref 0.50–1.10)
GFR calc non Af Amer: >90 mL/min (ref >90)
Glucose, Bld: 135 mg/dL — ABNORMAL HIGH (ref 70–99)
Potassium: 4.2 mEq/L (ref 3.7–5.3)
Sodium: 138 mEq/L (ref 137–147)

## 2013-11-26 LAB — LACTATE DEHYDROGENASE: LDH: 333 U/L — ABNORMAL HIGH (ref 94–250)

## 2013-11-26 LAB — GLUCOSE, CAPILLARY
GLUCOSE-CAPILLARY: 165 mg/dL — AB (ref 70–99)
GLUCOSE-CAPILLARY: 106 mg/dL — AB (ref 70–99)
Glucose-Capillary: 131 mg/dL — ABNORMAL HIGH (ref 70–99)
Glucose-Capillary: 141 mg/dL — ABNORMAL HIGH (ref 70–99)
Glucose-Capillary: 142 mg/dL — ABNORMAL HIGH (ref 70–99)
Glucose-Capillary: 143 mg/dL — ABNORMAL HIGH (ref 70–99)

## 2013-11-26 LAB — TRIGLYCERIDES: TRIGLYCERIDES: 62 mg/dL (ref <150)

## 2013-11-26 MED ORDER — RESOURCE THICKENUP CLEAR PO POWD
ORAL | Status: DC | PRN
  Filled 2013-11-26: qty 125

## 2013-11-26 MED ORDER — PANTOPRAZOLE SODIUM 40 MG PO PACK
40.0000 mg | PACK | Freq: Every day | ORAL | Status: DC
  Administered 2013-11-26 – 2013-12-02 (×6): 40 mg
  Filled 2013-11-26 (×10): qty 20

## 2013-11-26 MED ORDER — LOPERAMIDE HCL 2 MG PO CAPS
2.0000 mg | ORAL_CAPSULE | ORAL | Status: DC | PRN
  Administered 2013-11-26 – 2013-11-30 (×4): 2 mg via ORAL
  Filled 2013-11-26 (×3): qty 1

## 2013-11-26 MED ORDER — DEXAMETHASONE SODIUM PHOSPHATE 10 MG/ML IJ SOLN
4.0000 mg | Freq: Three times a day (TID) | INTRAMUSCULAR | Status: DC
  Administered 2013-11-26 – 2013-11-29 (×10): 4 mg via INTRAVENOUS
  Filled 2013-11-26 (×2): qty 1
  Filled 2013-11-26 (×3): qty 0.4
  Filled 2013-11-26 (×2): qty 1
  Filled 2013-11-26 (×6): qty 0.4
  Filled 2013-11-26: qty 1

## 2013-11-26 NOTE — Consult Note (Signed)
[facility]  Telephone:([phone]) Mark NOTE  Events since 11/19/13 noted. In review, she suffered a  syncopal episode resulting in head trauma. A CT scan of the brain, done on admission, demonstrated 2 large right-sided lesions in the frontal and parietal/occipital area with 12 mm of shift and subfalcine herniation.She underwent  right parietal/occipital craniotomy with resection of brain tumor on 11/12/13. Pathologic diagnosis showed high grade, poorly differentiated neuroendocrine carcinoma, small cell type. Chest CT confirmed a left lower lobe nodule and significant mediastinal adenopathy. She was consulted by Dr. Marin Olp.  Ms. Sharps was seen by radiation oncology, recommending WBR once post op status improved. For chemo options carboplatin/VP-16 was entertained for likely microscopic metastatic disease treatment. On 4/2 she was found to have CSF leak from bottom of incision that needed revision with drain put in place.She was transferred to [facility] and  the procedure took place on 4/3. Drainage continues to decrease. She is still lethargic with decreased orientation , but able to answer simple questions. She does not engage in conversation, falls back asleep immediately . No agitation noted.    MEDICATIONS: Scheduled Meds: . antiseptic oral rinse  15 mL Mouth Rinse QID  . atorvastatin  10 mg Oral QPM  . cyanocobalamin  500 mcg Oral Daily  . dexamethasone  4 mg Intravenous 3 times per day  . escitalopram  10 mg Oral Daily  . insulin aspart  0-15 Units Subcutaneous 6 times per day  . levETIRAcetam  500 mg Intravenous Q12H  . multivitamin  5 mL Per Tube Daily  . pantoprazole sodium  40 mg Per Tube QHS  . piperacillin-tazobactam (ZOSYN)  IV  3.375 g Intravenous Q8H  . ranitidine  150 mg Per Tube BID  . vancomycin  1,250 mg Intravenous Q12H   Continuous Infusions: . sodium chloride 20 mL/hr at 11/24/13 0918  . feeding supplement (VITAL AF 1.2 CAL)  Stopped (11/26/13 0940)   PRN Meds:.acetaminophen (TYLENOL) oral liquid 160 mg/5 mL, ipratropium-albuterol, labetalol, labetalol, morphine injection, ondansetron (ZOFRAN) IV, ondansetron, promethazine, promethazine, RESOURCE THICKENUP CLEAR  ALLERGIES:  No Known Allergies   PHYSICAL EXAMINATION:   Filed Vitals:   11/26/13 1000  BP: 121/63  Pulse: 65  Temp:   Resp: 13     Filed Weights   11/24/13 0335 11/25/13 0328 11/26/13 0600  Weight: 119 lb 11.4 oz (54.3 kg) 119 lb 11.4 oz (54.3 kg) 120 lb 2.4 oz (54.76 kg)    63 year old  in no acute distress, somewhat lethargic post operatively, follows simple verbal commands by falls back asleep. Cachectic. HEENT: Normocephalic bandadge across head. Oral cavity without thrush or lesions. Neck supple. no thyromegaly, no cervical or supraclavicular adenopathy  Lungs clear bilaterally . No wheezing, rhonchi or rales. Cardiac: regular rate and rhythm,no murmur , rubs or gallops Abdomen soft nontender , bowel sounds x4. No hepatosplenomegaly Extremities no clubbing cyanosis or edema. No  petechial rash Neuro: No focal or motor deficits  LABORATORY/RADIOLOGY DATA:   Recent Labs Lab 11/22/13 0543 11/23/13 0316 11/24/13 0234 11/25/13 0257 11/26/13 0320  WBC 19.2* 26.0* 22.0* 22.4* 20.1*  HGB 13.0 13.4 13.2 12.3 11.7*  HCT 38.4 38.8 38.4 35.9* 34.6*  PLT 330 317 295 286 264  MCV 93.7 93.0 93.4 92.5 93.8  MCH 31.7 32.1 32.1 31.7 31.7  MCHC 33.9 34.5 34.4 34.3 33.8  RDW 13.1 13.0 13.1 13.0 13.2    CMP    Recent Labs Lab 11/22/13 0543 11/23/13 0316 11/24/13  0234 11/25/13 0257 11/26/13 0320  NA 135* 138 139 136* 138  K 4.2 4.6 4.3 4.2 4.2  CL 98 98 101 100 100  CO2 26 26 27 24 27   GLUCOSE 149* 103* 138* 92 135*  BUN 38* 29* 38* 36* 33*  CREATININE 0.51 0.53 0.49* 0.43* 0.44*  CALCIUM 8.6 8.4 8.1* 8.0* 8.4        Component Value Date/Time   BILITOT 0.3 11/14/2013 0500    CBG:  Recent Labs Lab 11/25/13 1137  11/25/13 1557 11/25/13 1956 11/26/13 0005 11/26/13 0417  GLUCAP 122* 103* 172* 142* 165*    Radiology Studies:  Dg Abd 1 View  11/18/2013   CLINICAL DATA:  NG tube replaced for feeding.  EXAM: ABDOMEN - 1 VIEW  COMPARISON:  KUB from yesterday  FINDINGS: There is a nasogastric tube with tip terminating in the proximal stomach. The side port is at the GE junction.  There is motion artifact, decreasing sensitivity. Contrast remains present throughout the colon, without evidence of obstruction. No gross opacification of the lung bases.  IMPRESSION: Nasogastric tube enters the proximal stomach, with side port at the GE junction. Advancement by 5 cm would provide more secure positioning.   Electronically Signed   By: Jorje Guild M.D.   On: 11/18/2013 05:03   Ct Head Wo Contrast  11/23/2013   CLINICAL DATA:  Altered mental status. Status post resection of brain tumors.  EXAM: CT HEAD WITHOUT CONTRAST  TECHNIQUE: Contiguous axial images were obtained from the base of the skull through the vertex without intravenous contrast.  COMPARISON:  Multiple prior CTs of the head, the most recent of which was performed 11/20/2013  FINDINGS: Suspected trace blood at the site of right temporoparietal mass resection is somewhat less apparent. A few small foci of increased attenuation at the operative site are nonspecific; trace residual mass cannot be excluded. Surrounding vasogenic edema is again seen. The 2.3 x 2.0 cm mass at the right frontal lobe is essentially unchanged, with surrounding vasogenic edema. A tiny focus of increased attenuation more inferiorly may reflect slight nodular extension of the mass. There is mild mass effect due to vasogenic edema, with perhaps 4 mm of leftward midline shift, improved from prior studies.  No definite intraventricular blood is seen. No significant subdural or subarachnoid hemorrhage is identified. Trace residual pneumocephalus is seen at the surgical site.  The posterior  fossa, including the cerebellum, brainstem and fourth ventricle, is within normal limits.  A right temporoparietal craniotomy flap is again noted, with overlying skin staples and drainage catheter. The visualized portions of the orbits are within normal limits. The paranasal sinuses and mastoid air cells are well-aerated. No significant soft tissue abnormalities are seen.  IMPRESSION: 1. Suspected trace blood at the site of right temporoparietal mass resection is somewhat less apparent. Few small foci of increased attenuation at the operative site are nonspecific; trace residual mass cannot be excluded. Surrounding vasogenic edema again seen. 2. 2.3 x 2.0 cm mass at the right frontal lobe is essentially unchanged, with surrounding vasogenic edema. Mild mass effect noted, with perhaps 4 mm of leftward midline shift, improved from prior studies. 3. Trace residual pneumocephalus noted at the surgical site.   Electronically Signed   By: Garald Balding M.D.   On: 11/23/2013 03:26   Ct Head Wo Contrast  11/20/2013   CLINICAL DATA:  Persistent lethargy status post craniotomy for resection of lung cancer brain metastasis.  EXAM: CT HEAD WITHOUT CONTRAST  TECHNIQUE: Contiguous  axial images were obtained from the base of the skull through the vertex without intravenous contrast.  COMPARISON:  11/13/2013  FINDINGS: Sequelae of right parietal craniotomy for tumor resection are again identified. Pneumocephalus has resolved. There is trace extra-axial fluid at the craniotomy site. Small amount of blood products at the resection site have decreased in conspicuity. Vasogenic edema in this region it has also decreased. There is decreased effacement of the right lateral ventricle and decreased leftward midline shift, now measuring 7 mm (previously 11 mm). Trace blood is present in the occipital horn of the left lateral ventricle. There is no ventricular dilatation. 2.2 x 2.0 cm right frontal mass is unchanged, as is an adjacent 5  mm density anteroinferior to the mass which could reflect a small focus of hemorrhage as suggested on the prior CT or instead may represent the inferior most portion of the mass. Vasogenic edema surrounding the right frontal lobe mass is similar to the prior study.  Hypoattenuation involving cortex and white matter in the medial left parietal lobe is again seen although this is slightly less prominent than on the prior CT. Hypoattenuation in the region of the left hippocampal tail similar to the prior study. The visualized paranasal sinuses and mastoid air cells are clear. Orbits are unremarkable.  IMPRESSION: 1. Evolving postoperative changes in the right temporoparietal region with decreased edema and midline shift. 2. Unchanged appearance of right frontal mass and surrounding edema. 3. Trace blood in the occipital horn of the left lateral ventricle. 4. Stable to slightly decreased conspicuity of medial left parietal hypoattenuation, which could reflect ischemia.   Electronically Signed   By: Logan Bores   On: 11/20/2013 08:33     ASSESSMENT AND PLAN:  1. Small Cell Lung Cancer. For chemo and radiation when patient improves post operatively, especially since new CT brain on 4/4 cannot exclude residual disease.  2. Leukocytosis, in the setting of inflammation, pain and steroids. Trending down She is on broad spectrum antibiotics per pharmacy. 3. Severe protein malnutrition, in the setting of hospitalization and malignancy. On tube feed. Appreciate Nutrition follow up 4. History of tobacco abuse. 5. Full Code Other medical issues as per admitting team.  Rondel Jumbo, PA-C 11/26/2013, 11:32 AM   ADDENDUM:  I saw and examined the patient. I agree with the above assessment.  She still is in no shape for any type of therapy. I really that she has a highly treatable cancer, that being small cell lung cancer. However, given her current status, I just don't see that she really a strong enough right  now.  She still has a drain in her brain. The still significant drainage coming out of this. She is eating a little bit. She's on a dysphagia 3 diet.  She has some confusion. I am not sure how much she really grasps as to what is going on.  Her initial CT scans were done about 2 weeks ago.  A chest x-ray done recently and did not show anything that looked like progression.  Her lab work has been pretty stable.  Hopefully, we will be able to treat her in the future. I think if she can get her strength up. I think that if she can eat better then we might be able to do chemotherapy, possibly a reduced dose.  I'll have to get hold of her husband so I can talk to him about the situation with her small cell lung cancer.  We will continue to follow along. Not  much we can offer right now. We will have to watch her electrolytes. Make sure she does not get hyponatremic which can happen with small cell lung cancer.  We will continue pray hard for her.  Pete E.  Isaiah 40:29

## 2013-11-26 NOTE — Progress Notes (Signed)
Subjective: Patient reports Feels fair, still a bit disoriented and disorganized.  Objective: Vital signs in last 24 hours: Temp:  [97.6 F (36.4 C)-98.4 F (36.9 C)] 98.4 F (36.9 C) (04/07 0800) Pulse Rate:  [57-70] 58 (04/07 0900) Resp:  [11-17] 15 (04/07 0900) BP: (106-137)/(55-71) 120/60 mmHg (04/07 0900) SpO2:  [94 %-100 %] 97 % (04/07 0900) Weight:  [54.5 kg (120 lb 2.4 oz)] 54.5 kg (120 lb 2.4 oz) (04/07 0600)  Intake/Output from previous day: 04/06 0701 - 04/07 0700 In: 2125 [I.V.:250; AC/ZY:6063; IV Piggyback:610] Out: 835 [Urine:550; Drains:250; Stool:35] Intake/Output this shift: Total I/O In: 230 [I.V.:40; NG/GT:140; IV Piggyback:50] Out: 0   Drainage decreasing. Overnight apparently had 25 cc. Drainage looks more straw-colored suggesting it is more serous. Hopefully CSF leak is slowing  Lab Results:  Recent Labs  11/25/13 0257 11/26/13 0320  WBC 22.4* 20.1*  HGB 12.3 11.7*  HCT 35.9* 34.6*  PLT 286 264   BMET  Recent Labs  11/25/13 0257 11/26/13 0320  NA 136* 138  K 4.2 4.2  CL 100 100  CO2 24 27  GLUCOSE 92 135*  BUN 36* 33*  CREATININE 0.43* 0.44*  CALCIUM 8.0* 8.4    Studies/Results: No results found.  Assessment/Plan: Stable with improved decreased drainage.  LOS: 15 days  Swallowing study today and hopefully will be able to remove tubes in and she can start to eat. If drainage continues to decrease we'll hopefully be able to remove drains and.   Atziry Baranski J 11/26/2013, 9:58 AM

## 2013-11-26 NOTE — Procedures (Signed)
Objective Swallowing Evaluation: Modified Barium Swallowing Study  Patient Details  Name: Katie [name] MRN: [id number] Date of Birth: [birth year]  Today's Date: 11/26/2013 Time: 1000-1035 SLP Time Calculation (min): 35 min  Past Medical History:  Past Medical History  Diagnosis Date  . Anxiety     takes lexapro   Past Surgical History:  Past Surgical History  Procedure Laterality Date  . Appendectomy    . Colon surgery      polyps  removed  . Craniotomy Right 11/12/2013    Procedure: RIGHT PARIETAL CRANIOTOMY;  Surgeon: Kristeen Miss, MD;  Location: [place];  Service: Neurosurgery;  Laterality: Right;  . Craniotomy Right 11/22/2013    Procedure: Revision of Right Parietal occipital wound due to CSF Leak;  Surgeon: Kristeen Miss, MD;  Location: [place] [facility];  Service: Neurosurgery;  Laterality: Right;  Revision of Right Parietal occipital wound due to CSF Leak   HPI:  63 year old female admitted 11/11/13 for craniotomy and resection of right fronto-parietal tumor.  PMH insignificant.  Pt also has LLL mass c/w tumor.  Pt was extubated 3/27 and BSE completed this weekend with recommendation to continue npo with slp follow up.  Pt has tube feeding for nutrition.       Assessment / Plan / Recommendation Clinical Impression  Dysphagia Diagnosis: Mild pharyngeal phase dysphagia;Moderate pharyngeal phase dysphagia Clinical impression: Patient exhibits a mild to moderate sensori-motor pharyngeal dysphagia, characterized by delayed swallow initiation and reduced laryngeal elevation, as well as inadequate laryngeal closure, resulting in silent aspiration of thin liquids.  Cough on command, did not clear aspirated material.  Patient is unable to consistently follow compensatory strategies due to cognitive deficits.  Patient was unable to move the pill through the oral cavity to swallow, with puree or nectar thick liquids.  Patient is impulsive, and will require supervision and verbal/tactile cues to  follow aspiration precautions.    Treatment Recommendation  Therapy as outlined in treatment plan below    Diet Recommendation Dysphagia 3 (Mechanical Soft);Nectar-thick liquid   Liquid Administration via: Cup;No straw Medication Administration: Crushed with puree Supervision: Trained caregiver to feed patient;Full supervision/cueing for compensatory strategies Compensations: Slow rate;Small sips/bites Postural Changes and/or Swallow Maneuvers: Seated upright 90 degrees    Other  Recommendations Oral Care Recommendations: Oral care BID   Follow Up Recommendations  24 hour supervision/assistance;Inpatient Rehab    Frequency and Duration min 2x/week  2 weeks   Pertinent Vitals/Pain n/a    SLP Swallow Goals     General HPI: 63 year old female admitted 11/11/13 for craniotomy and resection of right fronto-parietal tumor.  PMH insignificant.  Pt also has LLL mass c/w tumor.  Pt was extubated 3/27 and BSE completed this weekend with recommendation to continue npo with slp follow up.  Pt has tube feeding for nutrition.   Type of Study: Modified Barium Swallowing Study Reason for Referral: Objectively evaluate swallowing function Previous Swallow Assessment: BSE 11/25/13 Diet Prior to this Study: Dysphagia 3 (soft) Temperature Spikes Noted: No Respiratory Status: Room air History of Recent Intubation: Yes Length of Intubations (days): 4 days Date extubated: 11/15/13 Behavior/Cognition: Alert;Cooperative;Pleasant mood;Confused;Impulsive;Distractible;Requires cueing;Decreased sustained attention Oral Cavity - Dentition: Adequate natural dentition Oral Motor / Sensory Function: Within functional limits Self-Feeding Abilities: Needs assist Patient Positioning: Upright in chair Baseline Vocal Quality: Clear Volitional Cough: Weak Anatomy: Within functional limits Pharyngeal Secretions: Not observed secondary MBS    Reason for Referral Objectively evaluate swallowing function   Oral  Phase Oral Preparation/Oral Phase  Oral Phase: WFL   Pharyngeal Phase Pharyngeal Phase Pharyngeal Phase: Impaired Pharyngeal - Nectar Pharyngeal - Nectar Cup: Delayed swallow initiation;Premature spillage to pyriform sinuses Pharyngeal - Thin Pharyngeal - Thin Cup: Delayed swallow initiation;Premature spillage to pyriform sinuses;Reduced laryngeal elevation;Other (Comment) (Patient unable to consistently follow compensatory strategie) Pharyngeal - Thin Straw: Delayed swallow initiation;Premature spillage to pyriform sinuses;Reduced laryngeal elevation;Reduced airway/laryngeal closure;Penetration/Aspiration during swallow;Trace aspiration;Compensatory strategies attempted (Comment) (Pt. unable) Penetration/Aspiration details (thin straw): Material enters airway, passes BELOW cords without attempt by patient to eject out (silent aspiration) Pharyngeal - Solids Pharyngeal - Puree: Delayed swallow initiation;Premature spillage to valleculae Pharyngeal - Mechanical Soft: Delayed swallow initiation;Premature spillage to valleculae Pharyngeal - Pill:  (Unable to move pill orally, with puree or nectar.)  Cervical Esophageal Phase    GO    Cervical Esophageal Phase Cervical Esophageal Phase: Silverio Lay T 11/26/2013, 11:14 AM

## 2013-11-27 ENCOUNTER — Inpatient Hospital Stay (HOSPITAL_COMMUNITY): Payer: BC Managed Care – PPO

## 2013-11-27 DIAGNOSIS — R1319 Other dysphagia: Secondary | ICD-10-CM

## 2013-11-27 DIAGNOSIS — C7A1 Malignant poorly differentiated neuroendocrine tumors: Secondary | ICD-10-CM

## 2013-11-27 DIAGNOSIS — R55 Syncope and collapse: Secondary | ICD-10-CM

## 2013-11-27 DIAGNOSIS — C787 Secondary malignant neoplasm of liver and intrahepatic bile duct: Secondary | ICD-10-CM

## 2013-11-27 LAB — CBC
HCT: 34.6 % — ABNORMAL LOW (ref 36.0–46.0)
Hemoglobin: 11.9 g/dL — ABNORMAL LOW (ref 12.0–15.0)
MCH: 32.2 pg (ref 26.0–34.0)
MCHC: 34.4 g/dL (ref 30.0–36.0)
MCV: 93.5 fL (ref 78.0–100.0)
PLATELETS: 231 10*3/uL (ref 150–400)
RBC: 3.7 MIL/uL — AB (ref 3.87–5.11)
RDW: 13.3 % (ref 11.5–15.5)
WBC: 19.1 10*3/uL — AB (ref 4.0–10.5)

## 2013-11-27 LAB — GLUCOSE, CAPILLARY
GLUCOSE-CAPILLARY: 133 mg/dL — AB (ref 70–99)
Glucose-Capillary: 109 mg/dL — ABNORMAL HIGH (ref 70–99)
Glucose-Capillary: 101 mg/dL — ABNORMAL HIGH (ref 70–99)
Glucose-Capillary: 130 mg/dL — ABNORMAL HIGH (ref 70–99)
Glucose-Capillary: 142 mg/dL — ABNORMAL HIGH (ref 70–99)
Glucose-Capillary: 176 mg/dL — ABNORMAL HIGH (ref 70–99)

## 2013-11-27 LAB — BASIC METABOLIC PANEL
BUN: 32 mg/dL — ABNORMAL HIGH (ref 6–23)
CALCIUM: 8.2 mg/dL — AB (ref 8.4–10.5)
CO2: 25 mEq/L (ref 19–32)
Chloride: 102 mEq/L (ref 96–112)
Creatinine, Ser: 0.58 mg/dL (ref 0.50–1.10)
Glucose, Bld: 88 mg/dL (ref 70–99)
Potassium: 4.5 mEq/L (ref 3.7–5.3)
SODIUM: 138 meq/L (ref 137–147)

## 2013-11-27 LAB — LACTATE DEHYDROGENASE: LDH: 289 U/L — AB (ref 94–250)

## 2013-11-27 MED ORDER — ENSURE COMPLETE PO LIQD
237.0000 mL | Freq: Two times a day (BID) | ORAL | Status: DC
  Administered 2013-11-27 – 2013-12-03 (×8): 237 mL via ORAL

## 2013-11-27 MED ORDER — ENSURE PUDDING PO PUDG
1.0000 | Freq: Three times a day (TID) | ORAL | Status: DC
  Administered 2013-11-27 – 2013-12-06 (×25): 1 via ORAL
  Filled 2013-11-27 (×15): qty 1

## 2013-11-27 MED ORDER — ADULT MULTIVITAMIN W/MINERALS CH
1.0000 | ORAL_TABLET | Freq: Every day | ORAL | Status: DC
  Administered 2013-11-27 – 2013-12-06 (×10): 1 via ORAL
  Filled 2013-11-27 (×10): qty 1

## 2013-11-27 NOTE — Progress Notes (Signed)
Patient ID: Katie [name], female   DOB: 15-Oct-1950, 63 y.o.   MRN: [id number] CSF still draining in approximately 220 cc per 24-hour period. 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 Leave the drain in today but discontinued tomorrow if wound becomes puffy then ultimately patient may require placement of ventriculoperitoneal shunt.

## 2013-11-27 NOTE — Progress Notes (Signed)
NUTRITION FOLLOW UP  DOCUMENTATION CODES Per approved criteria  -Moderate (non-severe) malnutrition in the context of chronic illness   INTERVENTION: Ensure Complete po BID, each supplement provides 350 kcal and 13 grams of protein  NUTRITION DIAGNOSIS: Inadequate oral intake related to inability to eat as evidenced by NPO status, progressing.   Goal: Pt to meet >/= 90% of their estimated nutrition needs, currently unmet  Monitor:  TF regimen & tolerance, weight, labs, I/O's  ASSESSMENT: 63 y.o. F who apparently had a syncopal episode 3/23 falling and hitting her head in the left frontal region. A CT scan of the brain demonstrated at least 2 large right-sided lesions one frontally and one parietal occipitally there is 12 mm of shift with subfalcine herniation. She underwent right parietal occipital craniotomy and resection of brain tumor 3/24. PCCM consulted post op for vent management.  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  Per RN PO intake variable. Spoke to pt who never opened her eyes, pt unable to provide answers to questions.   Height: Ht Readings from Last 1 Encounters:  11/22/13 5\' 7"  (1.702 m)    Weight: Wt Readings from Last 1 Encounters:  11/26/13 120 lb 2.4 oz (54.5 kg)  Admit wt 118 lb  BMI:  Body mass index is 18.81 kg/(m^2).   Estimated Nutritional Needs: Kcal: 1650-1850 Protein: 90-110 grams Fluid: 1.6-1.8 L  Skin: closed incision on head, abrasion on left elbow, ecchymosis on left hip  Diet Order: Dysphagia 3 with nectar thickened liquids Meal Completion: 90% yesterday lunch, unknown dinner consumed (per RN report very little), Breakfast today 90%   Intake/Output Summary (Last 24 hours) at 11/27/13 0832 Last data filed at 11/27/13 0600  Gross per 24 hour   Intake 1266.67 ml  Output   1420 ml  Net -153.33 ml   BM: 4/8 x 3  Labs:   Recent Labs Lab 11/25/13 0257 11/26/13 0320 11/27/13 0237  NA 136* 138 138  K 4.2 4.2 4.5  CL 100 100 102  CO2 24 27 25   BUN 36* 33* 32*  CREATININE 0.43* 0.44* 0.58  CALCIUM 8.0* 8.4 8.2*  GLUCOSE 92 135* 88    CBG (last 3)   Recent Labs  11/26/13 1622 11/26/13 2007 11/27/13 0352  GLUCAP 143* 106* 109*    Scheduled Meds: . antiseptic oral rinse  15 mL Mouth Rinse QID  . atorvastatin  10 mg Oral QPM  . cyanocobalamin  500 mcg Oral Daily  . dexamethasone  4 mg Intravenous 3 times per day  . escitalopram  10 mg Oral Daily  . insulin aspart  0-15 Units Subcutaneous 6 times per day  . levETIRAcetam  500 mg Intravenous Q12H  . multivitamin  5 mL Per Tube Daily  . pantoprazole sodium  40 mg Per Tube QHS  . piperacillin-tazobactam (ZOSYN)  IV  3.375 g Intravenous Q8H  . ranitidine  150 mg Per Tube BID  . vancomycin  1,250 mg Intravenous Q12H    Continuous Infusions: . sodium chloride 20 mL/hr at 11/24/13 0918  . feeding supplement (VITAL AF 1.2 CAL) Stopped (11/26/13 0940)    [place]earns, Lane, [place] Pager [phone] After Hours Pager

## 2013-11-27 NOTE — Progress Notes (Signed)
Speech Language Pathology Treatment: Dysphagia  Patient Details Name: Katie [name] MRN: [id number] DOB: 07-23-1951 Today's Date: 11/27/2013 Time: 3716-9678 SLP Time Calculation (min): 27 min  Assessment / Plan / Recommendation Clinical Impression  Patient ate 100% of lunch.  Family reports positive cough with liquid Ensure, which is NOT Nectar consistency.  Changed order to pudding Ensure, or RN will need to thicken liquid ensure.  Patient appears to tolerate small sips of nectar thick liquids without overt s/s of aspiration, however, silent aspiration was observed on the MBS (a larger amount of aspiration may trigger a cough).  Pt. Remains confused, and family reports pt's personality is very different than her baseline.  Family reports pt. Is usually very shy and quiet.    MD, Please order Cognitive evaluation and treat.    Thanks.   HPI HPI: 63 year old female admitted 11/11/13 for craniotomy and resection of right fronto-parietal tumor.  PMH insignificant.  Pt also has LLL mass c/w tumor.  Pt was extubated 3/27 and BSE completed this weekend with recommendation to continue npo with slp follow up.  Pt has tube feeding for nutrition.     Pertinent Vitals Afebrile; LS clear.  SLP Plan  Continue with current plan of care    Recommendations Diet recommendations: Dysphagia 3 (mechanical soft);Nectar-thick liquid Liquids provided via: Cup;No straw Medication Administration: Crushed with puree Supervision: Trained caregiver to feed patient;Full supervision/cueing for compensatory strategies Compensations: Slow rate;Small sips/bites Postural Changes and/or Swallow Maneuvers: Seated upright 90 degrees              Oral Care Recommendations: Oral care BID Follow up Recommendations: 24 hour supervision/assistance;Inpatient Rehab Plan: Continue with current plan of care    GO     Katie [name] 11/27/2013, 3:58 PM

## 2013-11-27 NOTE — Progress Notes (Signed)
Patient ID: Katie [name], female   DOB: 07-28-1951, 63 y.o.   MRN: [id number] Drain removed this afternoon. Will observe overnight. If recurrent fluid collection occurs then will require VP shunt.

## 2013-11-28 LAB — CBC
HEMATOCRIT: 35.1 % — AB (ref 36.0–46.0)
HEMOGLOBIN: 12.1 g/dL (ref 12.0–15.0)
MCH: 32.2 pg (ref 26.0–34.0)
MCHC: 34.5 g/dL (ref 30.0–36.0)
MCV: 93.4 fL (ref 78.0–100.0)
Platelets: DECREASED 10*3/uL (ref 150–400)
RBC: 3.76 MIL/uL — ABNORMAL LOW (ref 3.87–5.11)
RDW: 13.4 % (ref 11.5–15.5)
WBC: 16.4 10*3/uL — AB (ref 4.0–10.5)

## 2013-11-28 LAB — BASIC METABOLIC PANEL
BUN: 28 mg/dL — ABNORMAL HIGH (ref 6–23)
CHLORIDE: 97 meq/L (ref 96–112)
CO2: 28 mEq/L (ref 19–32)
Calcium: 8.4 mg/dL (ref 8.4–10.5)
Creatinine, Ser: 0.63 mg/dL (ref 0.50–1.10)
GFR calc non Af Amer: >90 mL/min (ref >90)
Glucose, Bld: 108 mg/dL — ABNORMAL HIGH (ref 70–99)
Potassium: 4.4 mEq/L (ref 3.7–5.3)
Sodium: 137 mEq/L (ref 137–147)

## 2013-11-28 LAB — GLUCOSE, CAPILLARY
GLUCOSE-CAPILLARY: 102 mg/dL — AB (ref 70–99)
GLUCOSE-CAPILLARY: 102 mg/dL — AB (ref 70–99)
Glucose-Capillary: 105 mg/dL — ABNORMAL HIGH (ref 70–99)
Glucose-Capillary: 146 mg/dL — ABNORMAL HIGH (ref 70–99)
Glucose-Capillary: 152 mg/dL — ABNORMAL HIGH (ref 70–99)
Glucose-Capillary: 92 mg/dL (ref 70–99)

## 2013-11-28 LAB — LACTATE DEHYDROGENASE: LDH: 283 U/L — AB (ref 94–250)

## 2013-11-28 MED ORDER — HYDROCODONE-ACETAMINOPHEN 5-325 MG PO TABS: 1.0000 | ORAL_TABLET | Freq: Four times a day (QID) | ORAL | Status: DC | PRN

## 2013-11-28 NOTE — Progress Notes (Signed)
Md made aware of slight change in mental status. Pt. More lethargic. Order for stat head CT given. Order carried out. No further orders at this time. Will continue to monitor.

## 2013-11-28 NOTE — Progress Notes (Signed)
Noticed a small rash on the patients chest and back. Pt does not seem to be affected by the rash; no itching noted. Notified Dr. Ellene Route of rash. No new orders received. Will continue to monitor. Sharren Bridge, RN

## 2013-11-28 NOTE — Progress Notes (Signed)
Subjective: Patient reports Doing fair. Wound is puffy.  Objective: Vital signs in last 24 hours: Temp:  [97.9 F (36.6 C)-98.6 F (37 C)] 98.2 F (36.8 C) (04/09 0700) Pulse Rate:  [50-60] 51 (04/09 0800) Resp:  [12-17] 13 (04/09 0800) BP: (97-129)/(48-77) 119/55 mmHg (04/09 0800) SpO2:  [96 %-100 %] 99 % (04/09 0800) Weight:  [54.6 kg (120 lb 5.9 oz)] 54.6 kg (120 lb 5.9 oz) (04/09 0500)  Intake/Output from previous day: 04/08 0701 - 04/09 0700 In: 1640 [P.O.:540; I.V.:240; IV Piggyback:860] Out: 1800 [Urine:1680; Drains:120] Intake/Output this shift: Total I/O In: 20 [I.V.:20] Out: 400 [Urine:400]  Ct last night shows ventricles larger and fluid in tumor bed and sub galeal.  Lab Results:  Recent Labs  11/27/13 0237 11/28/13 0255  WBC 19.1* 16.4*  HGB 11.9* 12.1  HCT 34.6* 35.1*  PLT 231 PLATELET CLUMPS NOTED ON SMEAR, COUNT APPEARS DECREASED   BMET  Recent Labs  11/27/13 0237 11/28/13 0255  NA 138 137  K 4.5 4.4  CL 102 97  CO2 25 28  GLUCOSE 88 108*  BUN 32* 28*  CREATININE 0.58 0.63  CALCIUM 8.2* 8.4    Studies/Results: Ct Head Wo Contrast  11/28/2013   CLINICAL DATA:  Neuro status change.  EXAM: CT HEAD WITHOUT CONTRAST  TECHNIQUE: Contiguous axial images were obtained from the base of the skull through the vertex without intravenous contrast.  COMPARISON:  11/23/2013  FINDINGS: Status post removal of a subgaleal drain which was present over a recent right parietal craniotomy. There is accumulation of sub galeal fluid around the bone flap, up to 9 mm in thickness. Gas in the subgaleal space and extra-axial spaces is again present. There is trace extra-axial fluid at the level of the bone flap, measuring no more than 2 mm. The dura or associated membrane is again seen as a thin high-density line. No postoperative hematoma.  More well-defined low attenuation in the temporoparietal region, the site of recently resected mass. No evidence of acute hemorrhage. No  increasing local mass effect. High-density mass in the right frontal lobe, measuring up to 24 mm, is unchanged in appearance, including small satellite nodule located more ventrally and anteriorly. Associated vasogenic edema is not increased.  These changes result in similar mass effect, causing left ward midline shift of 6-7 mm. When accounting for differences in scan angle, of the shift is stable from prior. No overt hydrocephalus. Similar lateral ventricular volume. No evidence of acute hemorrhage, cortical infarct, or herniation.  IMPRESSION: 1. Subgaleal fluid accumulation after drain removal, suggesting ongoing CSF leak. Minimal epidural fluid accumulation around the bone flap. 2. Progressive decreased density in the right parietal resection bed which may reflect clearing blood products or evolving infarct. 3. 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 4. Given differences in scan angle, unchanged leftward shift - up to 7 mm.   Electronically Signed   By: Jorje Guild M.D.   On: 11/28/2013 00:01    Assessment/Plan: CSF collecting . Wound dry.     LOS: 17 days  Observe here today. If leakage occurs will need Shunt. Mobilize for now.   Kristeen Miss 11/28/2013, 9:13 AM

## 2013-11-28 NOTE — Progress Notes (Signed)
Patient having headache and incision site is more puffy than morning, but no leaking. Pupils equal and reactive. Does follow commands. Dr Ellene Route made aware. Received prn Order for headache. Will continue to monitor.

## 2013-11-28 NOTE — Progress Notes (Signed)
Occupational Therapy Evaluation Patient Details Name: Katie [name] MRN: [id number] DOB: Jun 02, 1951 Today's Date: 11/28/2013    History of Present Illness 63 y.o. F s/p R parietal occipital craniotomy and resection of brain tumor 3/24.  On 4/2 she was found to have CSF leak from bottom of incision that needed revision with drain put in place.She was transferred to [facility] and  the procedure took place on 4/3.    Clinical Impression   PTA, pt lived at home with husband and was independent with ADL and mobility. Pt with significant functional decline requiring total A with ADL. Discussed concerns over change in status this pm as compared to earlier PT note. Feel pt will eventually be a good rehab candidate for CIR when medically stable. Will follow acutely to facilitate D/C to most appropriate rehab venue.    Feel pt/family would benefit from palliative care consult to educate family on disease process and provide necessary level of support.   Follow Up Recommendations  CIR;Supervision/Assistance - 24 hour    Equipment Recommendations  3 in 1 bedside comode;Tub/shower bench;Wheelchair (measurements OT)    Recommendations for Other Services Rehab consult when medically stable Palliative care consult     Precautions / Restrictions Precautions Precautions: Fall Precaution Comments: impulsive Restrictions Weight Bearing Restrictions: No      Mobility Bed Mobility Overal bed mobility: Needs Assistance Bed Mobility: Sit to Supine     Supine to sit: Max assist     General bed mobility comments: verbal and tactile cues to initiate LE movement and for hand placement and sequencing. pt unable to follow commands to bring LE EOB. mod A for sitting up and trunk control and for LE movement EOB  Transfers Overall transfer level: Needs assistance Equipment used:  (2 person gait belt) Transfers: Sit to/from [employer] Sit to Stand: Max assist Stand pivot transfers: Max  assist;+2 physical assistance       General transfer comment: Pt able to maintain motor control BLE during transfer    Balance Overall balance assessment: Needs assistance Sitting-balance support: Feet supported;Bilateral upper extremity supported Sitting balance-Leahy Scale: Zero Sitting balance - Comments: PT required Max A to sit EOB   Standing balance support: During functional activity;Bilateral upper extremity supported Standing balance-Leahy Scale: Zero Standing balance comment: pt req 2 person max A to maintain standing position                             ADL Overall ADL's : Needs assistance/impaired   Eating/Feeding Details (indicate cue type and reason): not assessed Grooming: Maximal assistance;Sitting   Upper Body Bathing: Maximal assistance;Sitting   Lower Body Bathing: Total assistance;Bed level   Upper Body Dressing : Maximal assistance;Sitting   Lower Body Dressing: Total assistance;Sit to/from stand   Toilet Transfer: Maximal assistance;Stand-pivot   Toileting- Clothing Manipulation and Hygiene: Total assistance       Functional mobility during ADLs: Maximal assistance General ADL Comments: Max to total A with all ADL     Vision                 Additional Comments: unable to accurately assess today due to lethargy and c/o HA   Perception Perception Perception Tested?: No (will further assess)   Praxis Praxis Praxis tested?: Deficits Deficits: Initiation;Perseveration;Organization    Pertinent Vitals/Pain C/o headache - did not rate - nsg notified.     Hand Dominance Right   Extremity/Trunk Assessment Upper Extremity Assessment  Upper Extremity Assessment: RUE deficits/detail;LUE deficits/detail;Generalized weakness RUE Deficits / Details: dificult to accurately assess BUE at this time. Moving BUE. strengthe @ 3/5. decreased coordination B LUE Deficits / Details: same as R. will further assess   Lower Extremity  Assessment Lower Extremity Assessment: Generalized weakness (able to weight bear BLE)   Cervical / Trunk Assessment Cervical / Trunk Assessment: Kyphotic;Other exceptions Cervical / Trunk Exceptions: poor postural control. unable to maintain midline posture without support   Communication Communication Communication: Expressive difficulties (dysarthric)   Cognition Arousal/Alertness: Lethargic Behavior During Therapy: Flat affect;Impulsive Overall Cognitive Status: Impaired/Different from baseline Area of Impairment: Orientation;Attention;Memory;Following commands;Safety/judgement;Awareness;Problem solving Orientation Level: Disoriented to;Place;Time;Situation Current Attention Level: Focused Memory: Decreased recall of precautions;Decreased short-term memory Following Commands: Follows one step commands inconsistently Safety/Judgement: Decreased awareness of safety;Decreased awareness of deficits Awareness: Intellectual Problem Solving: Slow processing;Decreased initiation;Difficulty sequencing;Requires verbal cues;Requires tactile cues General Comments: lethargic. unihibited at times.   General Comments       Exercises       Shoulder Instructions      Maurie Boettcher, OTR/L  [phone] Living Family/patient expects to be discharged to:: Private residence Living Arrangements: Spouse/significant other Available Help at Discharge: Family;Available 24 hours/day Type of Home: House Home Access: Stairs to enter [employer] of Steps: 3 Entrance Stairs-Rails: None Home Layout: Two level;Able to live on main level with bedroom/bathroom Alternate Level Stairs-Number of Steps: one flight Alternate Level Stairs-Rails: Right Bathroom Shower/Tub: Tub/shower unit Shower/tub characteristics: Architectural technologist: Standard Bathroom Accessibility: Yes How Accessible: Accessible via walker Home Equipment: [place] - 2 wheels;Wheelchair - manual   Additional  Comments: pt's husband provided home living info and states he will be available 24/7 at home.       Prior Functioning/Environment Level of Independence: Independent             OT Diagnosis: Generalized weakness;Cognitive deficits;Disturbance of vision;Acute pain   OT Problem List: Decreased strength;Decreased activity tolerance;Impaired balance (sitting and/or standing);Impaired vision/perception;Decreased coordination;Decreased cognition;Decreased safety awareness;Decreased knowledge of use of DME or AE;Decreased knowledge of precautions;Cardiopulmonary status limiting activity;Pain;Increased edema   OT Treatment/Interventions: Self-care/ADL training;Therapeutic exercise;DME and/or AE instruction;Energy conservation;Therapeutic activities;Cognitive remediation/compensation;Visual/perceptual remediation/compensation;Patient/family education;Balance training    OT Goals(Current goals can be found in the care plan section) Acute Rehab OT Goals Patient Stated Goal: none stated OT Goal Formulation: With family Time For Goal Achievement: 12/12/13 Potential to Achieve Goals: Good  OT Frequency: Min 2X/week   Barriers to D/C:            Co-evaluation              End of Session Equipment Utilized During Treatment: Gait belt Nurse Communication: Mobility status;Patient requests pain meds;Other (comment) (decline in status from earlier PT session)  Activity Tolerance: Patient limited by pain;Patient limited by lethargy Patient left: in bed;with call bell/phone within reach;with family/visitor present   Time: 1657-1730 OT Time Calculation (min): 33 min Charges:  OT General Charges $OT Visit: 1 Procedure OT Evaluation $Initial OT Evaluation Tier I: 1 Procedure OT Treatments $Self Care/Home Management : 23-37 mins G-Codes:    Roney Jaffe Trevyn Lumpkin 12-28-13, 5:42 PM

## 2013-11-28 NOTE — Evaluation (Signed)
Physical Therapy Re-Evaluation Patient Details Name: Katie [name] MRN: [id number] DOB: 1950/10/13 Today's Date: 11/28/2013   History of Present Illness  63 y.o. F s/p R parietal occipital craniotomy and resection of brain tumor 3/24.  On 4/2 she was found to have CSF leak from bottom of incision that needed revision with drain put in place.She was transferred to [facility] and  the procedure took place on 4/3.   Clinical Impression  Pt adm with the above dx. Pt is disoriented to place, time, and situation. Pt presents with decreased attention req frequent verbal cues to stay on task and to complete OOB activity. Pt is a good candidate for CIR to maximize independence for safe return home. Pt to benefit from skilled acute PT to address limitations listed below.    Follow Up Recommendations CIR;Supervision/Assistance - 24 hour    Equipment Recommendations  3in1 (PT)    Recommendations for Other Services Rehab consult     Precautions / Restrictions Precautions Precautions: Fall Restrictions Weight Bearing Restrictions: No      Mobility  Bed Mobility Overal bed mobility: Needs Assistance Bed Mobility: Supine to Sit     Supine to sit: Mod assist     General bed mobility comments: verbal and tactile cues to initiate LE movement and for hand placement and sequencing. pt unable to follow commands to bring LE EOB. mod A for sitting up and trunk control and for LE movement EOB  Transfers Overall transfer level: Needs assistance Equipment used:  (2 person gait belt) Transfers: Stand Pivot Transfers;Sit to/from Stand Sit to Stand: +2 physical assistance;Max assist Stand pivot transfers: Max assist;+2 physical assistance       General transfer comment: pt req blocking Bilat knees. pt req verbal and tactile cues to achieve upright standing position. pt unable to follow commands for initiating steps. pt req max A to initiate weight shift and and was able to spontaneously step.    Ambulation/Gait                Stairs            Wheelchair Mobility    Modified Rankin (Stroke Patients Only)       Balance Overall balance assessment: Needs assistance Sitting-balance support: Feet supported;No upper extremity supported Sitting balance-Leahy Scale: Fair Sitting balance - Comments: pt able to sit EOB for 10 min without UE support   Standing balance support: Bilateral upper extremity supported Standing balance-Leahy Scale: Zero Standing balance comment: pt req 2 person max A to maintain standing position                              Pertinent Vitals/Pain Pt reports no pain    Home Living Family/patient expects to be discharged to:: Private residence Living Arrangements: Spouse/significant other Available Help at Discharge: Family Type of Home: House Home Access: Stairs to enter Entrance Stairs-Rails:  (grab bars on both sides) Entrance Stairs-Number of Steps: 3 Home Layout: Two level;Able to live on main level with bedroom/bathroom Home Equipment: Gilford Rile - 2 wheels;Wheelchair - manual Additional Comments: pt's husband provided home living info and states he will be available 24/7 at home.     Prior Function Level of Independence: Independent               Hand Dominance        Extremity/Trunk Assessment   Upper Extremity Assessment: RUE deficits/detail;LUE deficits/detail RUE Deficits / Details: pt displays mild  coordination deficits during FNF test. deficits difficult to assess due to impaired cognition     LUE Deficits / Details: pt displays mild coordination deficits during FNF test. deficits difficult to assess due to impaired cognition   Lower Extremity Assessment: Generalized weakness         Communication   Communication: No difficulties  Cognition Arousal/Alertness: Awake/alert Behavior During Therapy: Impulsive;Restless;Flat affect Overall Cognitive Status: Impaired/Different from baseline Area  of Impairment: Orientation;Attention;Memory;Following commands;Safety/judgement;Awareness;Problem solving Orientation Level: Disoriented to;Place;Time;Situation Current Attention Level: Sustained Memory: Decreased recall of precautions;Decreased short-term memory Following Commands: Follows one step commands with increased time;Follows one step commands inconsistently Safety/Judgement: Decreased awareness of safety;Decreased awareness of deficits Awareness: Intellectual Problem Solving: Slow processing;Decreased initiation;Difficulty sequencing;Requires verbal cues;Requires tactile cues General Comments: pt was impulsive in transfers activities. pt req frequent verbal cues to correct attention to activity. unable to determine if pt has visual deficits or iif it is attention deficits causing difficulties with visual assessment.    General Comments General comments (skin integrity, edema, etc.): itchy back, skin resembling a rash type. RN aware    Exercises        Assessment/Plan    PT Assessment Patient needs continued PT services  PT Diagnosis Difficulty walking;Generalized weakness;Altered mental status   PT Problem List Decreased strength;Decreased range of motion;Decreased activity tolerance;Decreased balance;Decreased mobility;Decreased coordination;Decreased cognition;Decreased knowledge of use of DME;Decreased safety awareness  PT Treatment Interventions DME instruction;Gait training;Stair training;Functional mobility training;Therapeutic activities;Therapeutic exercise;Balance training   PT Goals (Current goals can be found in the Care Plan section) Acute Rehab PT Goals Patient Stated Goal: none stated PT Goal Formulation: With patient Time For Goal Achievement: 12/05/13 Potential to Achieve Goals: Good    Frequency Min 4X/week   Barriers to discharge        Co-evaluation               End of Session Equipment Utilized During Treatment: Gait belt Activity  Tolerance: Patient tolerated treatment well Patient left: in chair;with call bell/phone within reach;with family/visitor present;with chair alarm set Nurse Communication: Mobility status         Time: 1517-6160 PT Time Calculation (min): 35 min   Charges:   PT Evaluation $PT Re-evaluation: 1 Procedure PT Treatments $Therapeutic Activity: 8-22 mins   PT G Codes:          Manley Mason SPT 11/28/2013, 4:22 PM  Agree with above assessment.  Kittie Plater, PT, DPT Pager #: [phone] Office #: [phone]

## 2013-11-28 NOTE — Evaluation (Signed)
Physical Therapy Re-Evaluation Patient Details Name: Katie [name] MRN: [id number] DOB: [birth year] Today's Date: 11/28/2013   History of Present Illness  63 y.o. F s/p R parietal occipital craniotomy and resection of brain tumor 3/24.  On 4/2 she was found to have CSF leak from bottom of incision that needed revision with drain put in place.She was transferred to [facility] and  the procedure took place on 4/3.   Clinical Impression  Pt adm with the above dx. Pt is disoriented to place, time, and situation. Pt presents with decreased attention req frequent verbal cues to stay on task and to complete OOB activity. Pt is a good candidate for CIR to maximize independence for safe return home. Pt to benefit from skilled acute PT to address limitations listed below.    Follow Up Recommendations CIR;Supervision/Assistance - 24 hour    Equipment Recommendations  3in1 (PT)    Recommendations for Other Services Rehab consult     Precautions / Restrictions Precautions Precautions: Fall Restrictions Weight Bearing Restrictions: No      Mobility  Bed Mobility Overal bed mobility: Needs Assistance Bed Mobility: Supine to Sit     Supine to sit: Mod assist     General bed mobility comments: verbal and tactile cues to initiate LE movement and for hand placement and sequencing. pt unable to follow commands to bring LE EOB. mod A for sitting up and trunk control and for LE movement EOB  Transfers Overall transfer level: Needs assistance Equipment used:  (2 person gait belt) Transfers: Stand Pivot Transfers;Sit to/from Stand Sit to Stand: +2 physical assistance;Max assist Stand pivot transfers: Max assist;+2 physical assistance       General transfer comment: pt req blocking Bilat knees. pt req verbal and tactile cues to achieve upright standing position. pt unable to follow commands for initiating steps. pt req max A to initiate weight shift and and was able to spontaneously step.    Ambulation/Gait                Stairs            Wheelchair Mobility    Modified Rankin (Stroke Patients Only)       Balance Overall balance assessment: Needs assistance Sitting-balance support: Feet supported;No upper extremity supported Sitting balance-Leahy Scale: Fair Sitting balance - Comments: pt able to sit EOB for 10 min without UE support   Standing balance support: Bilateral upper extremity supported Standing balance-Leahy Scale: Zero Standing balance comment: pt req 2 person max A to maintain standing position                              Pertinent Vitals/Pain Pt reports no pain    Home Living Family/patient expects to be discharged to:: Private residence Living Arrangements: Spouse/significant other Available Help at Discharge: Family Type of Home: House Home Access: Stairs to enter Entrance Stairs-Rails:  (grab bars on both sides) Entrance Stairs-Number of Steps: 3 Home Layout: Two level;Able to live on main level with bedroom/bathroom Home Equipment: Gilford Rile - 2 wheels;Wheelchair - manual Additional Comments: pt's husband provided home living info and states he will be available 24/7 at home.     Prior Function Level of Independence: Independent               Hand Dominance        Extremity/Trunk Assessment   Upper Extremity Assessment: RUE deficits/detail;LUE deficits/detail RUE Deficits / Details: pt displays mild  coordination deficits during FNF test. deficits difficult to assess due to impaired cognition     LUE Deficits / Details: pt displays mild coordination deficits during FNF test. deficits difficult to assess due to impaired cognition   Lower Extremity Assessment: Generalized weakness         Communication   Communication: No difficulties  Cognition Arousal/Alertness: Awake/alert Behavior During Therapy: Impulsive;Restless;Flat affect Overall Cognitive Status: Impaired/Different from baseline Area  of Impairment: Orientation;Attention;Memory;Following commands;Safety/judgement;Awareness;Problem solving Orientation Level: Disoriented to;Place;Time;Situation Current Attention Level: Sustained Memory: Decreased recall of precautions;Decreased short-term memory Following Commands: Follows one step commands with increased time;Follows one step commands inconsistently Safety/Judgement: Decreased awareness of safety;Decreased awareness of deficits Awareness: Intellectual Problem Solving: Slow processing;Decreased initiation;Difficulty sequencing;Requires verbal cues;Requires tactile cues General Comments: pt was impulsive in transfers activities. pt req frequent verbal cues to correct attention to activity. unable to determine if pt has visual deficits or iif it is attention deficits causing difficulties with visual assessment.    General Comments General comments (skin integrity, edema, etc.): itchy back, skin resembling a rash type. RN aware    Exercises        Assessment/Plan    PT Assessment Patient needs continued PT services  PT Diagnosis Difficulty walking;Generalized weakness;Altered mental status   PT Problem List Decreased strength;Decreased range of motion;Decreased activity tolerance;Decreased balance;Decreased mobility;Decreased coordination;Decreased cognition;Decreased knowledge of use of DME;Decreased safety awareness  PT Treatment Interventions DME instruction;Gait training;Stair training;Functional mobility training;Therapeutic activities;Therapeutic exercise;Balance training   PT Goals (Current goals can be found in the Care Plan section) Acute Rehab PT Goals Patient Stated Goal: none stated PT Goal Formulation: With patient Time For Goal Achievement: 12/05/13 Potential to Achieve Goals: Good    Frequency Min 4X/week   Barriers to discharge        Co-evaluation               End of Session Equipment Utilized During Treatment: Gait belt Activity  Tolerance: Patient tolerated treatment well Patient left: in chair;with call bell/phone within reach;with family/visitor present;with chair alarm set Nurse Communication: Mobility status         Time: 0165-5374 PT Time Calculation (min): 35 min   Charges:   PT Evaluation $PT Re-evaluation: 1 Procedure PT Treatments $Therapeutic Activity: 8-22 mins   PT G Codes:          Manley Mason SPT 11/28/2013, 4:22 PM

## 2013-11-29 LAB — GLUCOSE, CAPILLARY
GLUCOSE-CAPILLARY: 111 mg/dL — AB (ref 70–99)
GLUCOSE-CAPILLARY: 122 mg/dL — AB (ref 70–99)
GLUCOSE-CAPILLARY: 123 mg/dL — AB (ref 70–99)
Glucose-Capillary: 110 mg/dL — ABNORMAL HIGH (ref 70–99)
Glucose-Capillary: 84 mg/dL (ref 70–99)
Glucose-Capillary: 154 mg/dL — ABNORMAL HIGH (ref 70–99)

## 2013-11-29 LAB — BASIC METABOLIC PANEL
BUN: 25 mg/dL — AB (ref 6–23)
CALCIUM: 8.5 mg/dL (ref 8.4–10.5)
CO2: 27 meq/L (ref 19–32)
CREATININE: 0.44 mg/dL — AB (ref 0.50–1.10)
Chloride: 98 mEq/L (ref 96–112)
GFR calc Af Amer: >90 mL/min (ref >90)
GFR calc non Af Amer: >90 mL/min (ref >90)
GLUCOSE: 112 mg/dL — AB (ref 70–99)
Potassium: 4.4 mEq/L (ref 3.7–5.3)
Sodium: 136 mEq/L — ABNORMAL LOW (ref 137–147)

## 2013-11-29 LAB — CSF CELL COUNT WITH DIFFERENTIAL
RBC COUNT CSF: 4 /mm3 — AB
TUBE #: 3
WBC, CSF: 1 /mm3 (ref 0–5)

## 2013-11-29 LAB — PROTEIN AND GLUCOSE, CSF
Glucose, CSF: 71 mg/dL (ref 43–76)
Glucose, CSF: 71 mg/dL (ref 43–76)
TOTAL PROTEIN, CSF: 82 mg/dL — AB (ref 15–45)
TOTAL PROTEIN, CSF: 82 mg/dL — AB (ref 15–45)

## 2013-11-29 LAB — CBC
HCT: 35.5 % — ABNORMAL LOW (ref 36.0–46.0)
Hemoglobin: 12.1 g/dL (ref 12.0–15.0)
MCH: 32.1 pg (ref 26.0–34.0)
MCHC: 34.1 g/dL (ref 30.0–36.0)
MCV: 94.2 fL (ref 78.0–100.0)
Platelets: 208 10*3/uL (ref 150–400)
RBC: 3.77 MIL/uL — ABNORMAL LOW (ref 3.87–5.11)
RDW: 13.4 % (ref 11.5–15.5)
WBC: 16.8 10*3/uL — AB (ref 4.0–10.5)

## 2013-11-29 LAB — LACTATE DEHYDROGENASE: LDH: 257 U/L — AB (ref 94–250)

## 2013-11-29 MED ORDER — DEXAMETHASONE SODIUM PHOSPHATE 4 MG/ML IJ SOLN
4.0000 mg | Freq: Three times a day (TID) | INTRAMUSCULAR | Status: DC
  Administered 2013-11-29 – 2013-12-03 (×11): 4 mg via INTRAVENOUS
  Filled 2013-11-29 (×16): qty 1

## 2013-11-29 NOTE — Progress Notes (Signed)
Physical Therapy Treatment Patient Details Name: Katie [name] MRN: [id number] DOB: Sep 03, 1950 Today's Date: 11/29/2013    History of Present Illness 63 y.o. F s/p R parietal occipital craniotomy and resection of brain tumor 3/24.  On 4/2 she was found to have CSF leak from bottom of incision that needed revision with drain put in place.She was transferred to [facility] and  the procedure took place on 4/3.     PT Comments    Pt tolerated ambulation well despite maximal verbal and tactile cues to achieve appropriate sequencing. Pt con't to impaired attention, problem solving, sequencing, and freq perseveration. Pt con't to be appropriate for CIR upon d/c. Acute PT to con't to follow.   Follow Up Recommendations  CIR;Supervision/Assistance - 24 hour     Equipment Recommendations  3in1 (PT)    Recommendations for Other Services Rehab consult     Precautions / Restrictions Precautions Precautions: Fall Precaution Comments: impulsive Restrictions Weight Bearing Restrictions: No    Mobility  Bed Mobility Overal bed mobility: Needs Assistance Bed Mobility: Sit to Supine     Supine to sit: Max assist     General bed mobility comments: max directional verbal and tactile cues to initiate and complete task  Transfers Overall transfer level: Needs assistance Equipment used: 2 person hand held assist Transfers: Sit to/from Stand Sit to Stand: Max assist Stand pivot transfers: +2 physical assistance       General transfer comment: max tactile cues to promote appropriate sequencing for sit to stand transfer  Ambulation/Gait Ambulation/Gait assistance: +2 physical assistance;Max assist (2nd person for chair follow and line management) Ambulation Distance (Feet): 35 Feet (x2) Assistive device: Rolling walker (2 wheeled);2 person hand held assist Gait Pattern/deviations: Step-to pattern;Narrow base of support Gait velocity: decreased   General Gait Details: maximal tactile at  posterior bilat hips to promote hip extension and sequencing of appropriate weight-shift and advancement of LEs to sequence stepping. Pt required same assist with and without RW. Pt with bilat knees in hyperestension and retrolpulsion despite maximal verbal and tactile cues.   Stairs            Wheelchair Mobility    Modified Rankin (Stroke Patients Only)       Balance Overall balance assessment: Needs assistance Sitting-balance support: Feet supported Sitting balance-Leahy Scale: Poor   Postural control: Posterior lean Standing balance support: Bilateral upper extremity supported Standing balance-Leahy Scale: Zero Standing balance comment: 2 person required for safe standing                    Cognition Arousal/Alertness: Awake/alert Behavior During Therapy: Flat affect;Impulsive Overall Cognitive Status: Impaired/Different from baseline Area of Impairment: Orientation;Attention;Memory;Following commands;Safety/judgement;Awareness;Problem solving Orientation Level: Disoriented to;Place;Situation;Time Current Attention Level: Focused Memory: Decreased recall of precautions;Decreased short-term memory Following Commands: Follows one step commands inconsistently;Follows one step commands with increased time Safety/Judgement: Decreased awareness of safety;Decreased awareness of deficits Awareness: Intellectual Problem Solving: Slow processing;Decreased initiation;Difficulty sequencing;Requires verbal cues;Requires tactile cues General Comments: pt perseverates on one word. pt easily distracted with tangible speech. Suspect pt to have visiual deficits however unable to accurately assess due to cognition. pt perfers to keep eyes closed    Exercises      General Comments        Pertinent Vitals/Pain Denies pain    Home Living                      Prior Function  PT Goals (current goals can now be found in the care plan section) Acute  Rehab PT Goals Patient Stated Goal: none stated Progress towards PT goals: Progressing toward goals    Frequency  Min 4X/week    PT Plan Current plan remains appropriate    Co-evaluation             End of Session Equipment Utilized During Treatment: Gait belt Activity Tolerance: Patient tolerated treatment well Patient left: in chair;with call bell/phone within reach;with family/visitor present;with chair alarm set     Time: 5009-3818 PT Time Calculation (min): 25 min  Charges:  $Gait Training: 8-22 mins $Therapeutic Activity: 8-22 mins                    G Codes:      Ariv Penrod M Aundrea Horace 2013-12-13, 11:55 AM  Kittie Plater, PT, DPT Pager #: [phone] Office #: [phone]

## 2013-11-29 NOTE — Progress Notes (Signed)
Occupational Therapy Treatment Patient Details Name: Bernette Seeman MRN: [id number] DOB: 09-11-1950 Today's Date: 11/29/2013    History of present illness 63 y.o. F s/p R parietal occipital craniotomy and resection of brain tumor 3/24.  On 4/2 she was found to have CSF leak from bottom of incision that needed revision with drain put in place.She was transferred to [facility] and  the procedure took place on 4/3.    OT comments  Pt progressing toward STG. Pt demonstrates static standing ~2 minutes for peri care this session and transferring max (A) chair <>3n1 3n1<>chair. Recommend Cir to address attention, balance, cognition and activity tolerance.  Follow Up Recommendations  CIR    Equipment Recommendations  3 in 1 bedside comode    Recommendations for Other Services Rehab consult    Precautions / Restrictions Precautions Precautions: Fall Precaution Comments: impulsive       Mobility Bed Mobility                  Transfers Overall transfer level: Needs assistance   Transfers: Sit to/from Stand Sit to Stand: Max assist         General transfer comment: manual facilitation for upright posture and pelvic alignment. pt static standing for ~2 minutes    Balance         Postural control: Posterior lean                         ADL Overall ADL's : Needs assistance/impaired             Lower Body Bathing: Total assistance;+2 for physical assistance;+2 for safety/equipment;Sit to/from stand   Upper Body Dressing : Maximal assistance       Toilet Transfer: Maximal assistance;Stand-pivot;Cueing for safety (facilitation of weight shift)           Functional mobility during ADLs: Maximal assistance General ADL Comments: Pt in recliner on arrival and verbalized need to void to husband. OT entering room to (A). pt attempting to place feet in 3n1 and reclining further back into chair. Pt voiding bowel prior to transfer. Pt requried total +2 (A) for  peri hygiene. pt max (A) to static stand for peri care. pt transferred to 3n1 for chair to bed cleaned. pt returned to recliner per patients request. pt talking to people not present in room and when asked to visually scan room reports sister is in the corner. pt closing Rt eye for visual assessment. pt with focused attention so visual assessment difficult      Vision                 Additional Comments: Pt closing Rt eye. pt following object in all visual fields with therapist tactile input to maintain bil eyes open. Difficult to fully assess but based on this session pt does not report diplopia. pt however when asked to look at object closing Rt eye   Perception     Praxis      Cognition   Behavior During Therapy: Flat affect;Impulsive Overall Cognitive Status: Impaired/Different from baseline Area of Impairment: Orientation;Attention;Memory;Following commands;Safety/judgement;Awareness;Problem solving Orientation Level: Disoriented to;Place;Time;Situation Current Attention Level: Focused Memory: Decreased recall of precautions;Decreased short-term memory  Following Commands: Follows one step commands inconsistently Safety/Judgement: Decreased awareness of safety;Decreased awareness of deficits Awareness: Intellectual Problem Solving: Slow processing;Difficulty sequencing;Decreased initiation General Comments: pt perseverating on need to void but unable to follow commands due to internally distracted with verbalizations. Pt voiding in chair and now  following simple commands at 75% accuracy.    Extremity/Trunk Assessment               Exercises     Shoulder Instructions       General Comments      Pertinent Vitals/ Pain       None reported  Home Living                                          Prior Functioning/Environment              Frequency Min 3X/week     Progress Toward Goals  OT Goals(current goals can now be found in the  care plan section)  Progress towards OT goals: Progressing toward goals  Acute Rehab OT Goals Patient Stated Goal: none stated OT Goal Formulation: With family Time For Goal Achievement: 12/12/13 Potential to Achieve Goals: Good ADL Goals Pt Will Perform Grooming: with set-up;sitting Pt Will Perform Upper Body Bathing: with set-up;sitting Pt Will Transfer to Toilet: with min assist;stand pivot transfer;bedside commode Additional ADL Goal #1: Maintain sitting balance EOB x 10 min with S for ADL  Plan Discharge plan remains appropriate    Co-evaluation                 End of Session     Activity Tolerance Patient limited by fatigue   Patient Left in chair;with call bell/phone within reach;with chair alarm set   Nurse Communication Mobility status;Precautions        Time: 4742-5956 OT Time Calculation (min): 19 min  Charges: OT General Charges $OT Visit: 1 Procedure OT Treatments $Self Care/Home Management : 8-22 mins  Peri Maris 11/29/2013, 3:40 PM Pager: [phone]

## 2013-11-29 NOTE — Progress Notes (Signed)
UR completed. Candidate for CIR at d/c. Needs official consult ordered.   Sandi Mariscal, RN BSN [place] CCM Trauma/Neuro ICU Case Manager [phone]

## 2013-11-29 NOTE — Progress Notes (Addendum)
Rehab Admissions Coordinator Note:  Patient was screened by Cleatrice Burke for appropriateness for an Inpatient Acute Rehab Consult. Per PT and OT recommendation. At this time, we are recommending Inpatient Rehab consult if oncology and Dr. Ellene Route feel appropriate.Also pending aggressiveness of care intended.  Audelia Acton [facility] 11/29/2013, 10:45 AM  I can be reached at [phone].

## 2013-11-29 NOTE — Progress Notes (Signed)
Speech Language Pathology Treatment: Dysphagia  Patient Details Name: Katie [name] MRN: [id number] DOB: December 12, 1950 Today's Date: 11/29/2013 Time: 3614-4315 SLP Time Calculation (min): 30 min  Assessment / Plan / Recommendation Clinical Impression  Pt was seen at bedside. Husband present. Pt awake, but kept eyes closed if not cued to open them. Pt tolerated bites of ice cream (nectar thick when melted) presented by SLP.  RN reports pt eating almost all of her meals.  Will need repeat MBS prior to advancing liquids, due to silent aspiration seen on MBS.  Cognitive evaluation is strongly recommended, both for evaluation and remediation purposes, but for family education and training as well.  ST to continue to follow pt for diet tolerance, repeat MBS, and cog eval (when ordered). RN aware.   HPI HPI: 63 year old female admitted 11/11/13 for craniotomy and resection of right fronto-parietal tumor.  PMH insignificant.  Pt also has LLL mass c/w tumor.  MBS Tuesday 4/6 with rec for dys 3/nectar. Silent aspiration of thin liquids noted   Pertinent Vitals VSS  SLP Plan  Continue with current plan of care    Recommendations Diet recommendations: Dysphagia 3 (mechanical soft);Nectar-thick liquid Liquids provided via: Cup;No straw Medication Administration: Crushed with puree Supervision: Trained caregiver to feed patient;Full supervision/cueing for compensatory strategies Compensations: Slow rate;Small sips/bites Postural Changes and/or Swallow Maneuvers: Seated upright 90 degrees              General recommendations: Rehab consult Oral Care Recommendations: Oral care BID Follow up Recommendations: 24 hour supervision/assistance;Inpatient Rehab Plan: Continue with current plan of care    [place]. [place], [facility], CCC-SLP [phone]  Lorre Nick 11/29/2013, 4:34 PM

## 2013-11-29 NOTE — Progress Notes (Signed)
Subjective: Patient reports Persistent fluid collection in back of scalp  Objective: Vital signs in last 24 hours: Temp:  [97.4 F (36.3 C)-98.2 F (36.8 C)] 97.8 F (36.6 C) (04/10 1541) Pulse Rate:  [54-81] 62 (04/10 1500) Resp:  [0-28] 0 (04/10 1500) BP: (99-141)/(49-64) 101/62 mmHg (04/10 1500) SpO2:  [97 %-100 %] 98 % (04/10 1500) Weight:  [53.8 kg (118 lb 9.7 oz)] 53.8 kg (118 lb 9.7 oz) (04/10 0400)  Intake/Output from previous day: 04/09 0701 - 04/10 0700 In: 1000 [P.O.:400; I.V.:340; IV Piggyback:260] Out: 0454 [Urine:1345; Stool:1] Intake/Output this shift: Total I/O In: 470 [P.O.:400; I.V.:70] Out: 335 [Urine:335]  Fluid collection under modest pressure. Question of hydrocephalus  Lab Results:  Recent Labs  11/28/13 0255 11/29/13 0325  WBC 16.4* 16.8*  HGB 12.1 12.1  HCT 35.1* 35.5*  PLT PLATELET CLUMPS NOTED ON SMEAR, COUNT APPEARS DECREASED 208   BMET  Recent Labs  11/28/13 0255 11/29/13 0325  NA 137 136*  K 4.4 4.4  CL 97 98  CO2 28 27  GLUCOSE 108* 112*  BUN 28* 25*  CREATININE 0.63 0.44*  CALCIUM 8.4 8.5    Studies/Results: Ct Head Wo Contrast  11/28/2013   CLINICAL DATA:  Neuro status change.  EXAM: CT HEAD WITHOUT CONTRAST  TECHNIQUE: Contiguous axial images were obtained from the base of the skull through the vertex without intravenous contrast.  COMPARISON:  11/23/2013  FINDINGS: Status post removal of a subgaleal drain which was present over a recent right parietal craniotomy. There is accumulation of sub galeal fluid around the bone flap, up to 9 mm in thickness. Gas in the subgaleal space and extra-axial spaces is again present. There is trace extra-axial fluid at the level of the bone flap, measuring no more than 2 mm. The dura or associated membrane is again seen as a thin high-density line. No postoperative hematoma.  More well-defined low attenuation in the temporoparietal region, the site of recently resected mass. No evidence of acute  hemorrhage. No increasing local mass effect. High-density mass in the right frontal lobe, measuring up to 24 mm, is unchanged in appearance, including small satellite nodule located more ventrally and anteriorly. Associated vasogenic edema is not increased.  These changes result in similar mass effect, causing left ward midline shift of 6-7 mm. When accounting for differences in scan angle, of the shift is stable from prior. No overt hydrocephalus. Similar lateral ventricular volume. No evidence of acute hemorrhage, cortical infarct, or herniation.  IMPRESSION: 1. Subgaleal fluid accumulation after drain removal, suggesting ongoing CSF leak. Minimal epidural fluid accumulation around the bone flap. 2. Progressive decreased density in the right parietal resection bed which may reflect clearing blood products or evolving infarct. 3. 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 4. Given differences in scan angle, unchanged leftward shift - up to 7 mm.   Electronically Signed   By: Jorje Guild M.D.   On: 11/28/2013 00:01    Assessment/Plan: Will perform LP to send fluid for cytology rule out carcinomatous meningitis  LOS: 18 days  LP this afternoon   Kristeen Miss 11/29/2013, 3:55 PM

## 2013-11-29 NOTE — Procedures (Signed)
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 She also has a right frontal lesion that is present. The patient presented with herniation syndrome at the time of her emergent surgery. She's had persistent CSF leak has been treated with subgaleal drainage. She now has a fluid collection subgaleally that is not leaking it is under a fair amount of pressure. 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 The possibility also exists that she may have a carcinomatous meningitis. Therefore lumbar puncture is being performed to rule this out.  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 Surgeon: Kristeen Miss Anesthesia: 1 cc lidocaine without epinephrine. Procedure: Patient was placed in the left lateral decubitus position. The back was prepped with Betadine solution. When dry the interspinous space at L3-L4 was palpated. The skin above this area was infiltrated with 1 cc lidocaine without epinephrine. A 20-gauge spinal needle was then inserted into the subarachnoid space, opening pressure was noted to be 170 mm of water. 40 cc of CSF was then drained. The CSF was noted be slightly xanthochromic. The fluid was to be sent for cell count culture glucose protein and cytology. Patient tolerated procedure well.

## 2013-11-30 LAB — BASIC METABOLIC PANEL
BUN: 26 mg/dL — ABNORMAL HIGH (ref 6–23)
CALCIUM: 8.5 mg/dL (ref 8.4–10.5)
CHLORIDE: 99 meq/L (ref 96–112)
CO2: 29 meq/L (ref 19–32)
Creatinine, Ser: 0.59 mg/dL (ref 0.50–1.10)
GFR calc Af Amer: >90 mL/min (ref >90)
GFR calc non Af Amer: >90 mL/min (ref >90)
GLUCOSE: 123 mg/dL — AB (ref 70–99)
Potassium: 4.7 mEq/L (ref 3.7–5.3)
SODIUM: 138 meq/L (ref 137–147)

## 2013-11-30 LAB — GLUCOSE, CAPILLARY
GLUCOSE-CAPILLARY: 138 mg/dL — AB (ref 70–99)
GLUCOSE-CAPILLARY: 118 mg/dL — AB (ref 70–99)
GLUCOSE-CAPILLARY: 105 mg/dL — AB (ref 70–99)
GLUCOSE-CAPILLARY: 143 mg/dL — AB (ref 70–99)
Glucose-Capillary: 142 mg/dL — ABNORMAL HIGH (ref 70–99)
Glucose-Capillary: 119 mg/dL — ABNORMAL HIGH (ref 70–99)

## 2013-11-30 LAB — CBC
HEMATOCRIT: 34.6 % — AB (ref 36.0–46.0)
Hemoglobin: 11.6 g/dL — ABNORMAL LOW (ref 12.0–15.0)
MCH: 31.6 pg (ref 26.0–34.0)
MCHC: 33.5 g/dL (ref 30.0–36.0)
MCV: 94.3 fL (ref 78.0–100.0)
PLATELETS: 207 10*3/uL (ref 150–400)
RBC: 3.67 MIL/uL — AB (ref 3.87–5.11)
RDW: 13.4 % (ref 11.5–15.5)
WBC: 16.7 10*3/uL — ABNORMAL HIGH (ref 4.0–10.5)

## 2013-11-30 LAB — LACTATE DEHYDROGENASE: LDH: 255 U/L — ABNORMAL HIGH (ref 94–250)

## 2013-11-30 NOTE — Progress Notes (Signed)
Patient looks much better. Sitting up at bedside. Still with some mild line which dysfunction but overall doing well. Right-sided subgaleal fluid collection has resolved. CSF sent for studies not consistent with infection nor is there significant cellular material to be worrisome for carcinomatous meningitis.  Overall doing well. Patient stable for transfer to [facility] for further medical care.

## 2013-12-01 LAB — CBC
HCT: 35.7 % — ABNORMAL LOW (ref 36.0–46.0)
HEMOGLOBIN: 12.1 g/dL (ref 12.0–15.0)
MCH: 32 pg (ref 26.0–34.0)
MCHC: 33.9 g/dL (ref 30.0–36.0)
MCV: 94.4 fL (ref 78.0–100.0)
PLATELETS: 200 10*3/uL (ref 150–400)
RBC: 3.78 MIL/uL — AB (ref 3.87–5.11)
RDW: 13.6 % (ref 11.5–15.5)
WBC: 20.1 10*3/uL — ABNORMAL HIGH (ref 4.0–10.5)

## 2013-12-01 LAB — LACTATE DEHYDROGENASE: LDH: 259 U/L — AB (ref 94–250)

## 2013-12-01 LAB — GLUCOSE, CAPILLARY
GLUCOSE-CAPILLARY: 142 mg/dL — AB (ref 70–99)
Glucose-Capillary: 126 mg/dL — ABNORMAL HIGH (ref 70–99)
Glucose-Capillary: 108 mg/dL — ABNORMAL HIGH (ref 70–99)
Glucose-Capillary: 116 mg/dL — ABNORMAL HIGH (ref 70–99)
Glucose-Capillary: 134 mg/dL — ABNORMAL HIGH (ref 70–99)
Glucose-Capillary: 114 mg/dL — ABNORMAL HIGH (ref 70–99)

## 2013-12-01 LAB — BASIC METABOLIC PANEL
BUN: 26 mg/dL — ABNORMAL HIGH (ref 6–23)
CALCIUM: 8.5 mg/dL (ref 8.4–10.5)
CO2: 28 meq/L (ref 19–32)
Chloride: 97 mEq/L (ref 96–112)
Creatinine, Ser: 0.56 mg/dL (ref 0.50–1.10)
GFR calc Af Amer: >90 mL/min (ref >90)
Glucose, Bld: 127 mg/dL — ABNORMAL HIGH (ref 70–99)
POTASSIUM: 4.5 meq/L (ref 3.7–5.3)
SODIUM: 135 meq/L — AB (ref 137–147)

## 2013-12-01 NOTE — Progress Notes (Signed)
Overall doing well. Continues to get stronger. No headache. Speech improving.  Afebrile. Awake and alert. Speech still somewhat dysarthric. Left facial droop stable. Left hemiparesis stable. Wound clean and dry. A subgaleal collection a little larger than yesterday but under no pressure. Chest and abdomen benign.  I will transfer to floor. I've discussed with oncology and they've recommended waiting on transfer to [place] until tomorrow when therapies can be instituted.

## 2013-12-02 ENCOUNTER — Ambulatory Visit
Admit: 2013-12-02 | Discharge: 2013-12-02 | Disposition: A | Discharge status: Home / Self Care | Payer: BC Managed Care – PPO | Attending: Radiation Oncology | Admitting: Radiation Oncology

## 2013-12-02 DIAGNOSIS — C7931 Secondary malignant neoplasm of brain: Secondary | ICD-10-CM

## 2013-12-02 DIAGNOSIS — R51 Headache: Secondary | ICD-10-CM

## 2013-12-02 LAB — BASIC METABOLIC PANEL
BUN: 34 mg/dL — ABNORMAL HIGH (ref 6–23)
CO2: 26 mEq/L (ref 19–32)
Calcium: 8.2 mg/dL — ABNORMAL LOW (ref 8.4–10.5)
Chloride: 98 mEq/L (ref 96–112)
Creatinine, Ser: 0.49 mg/dL — ABNORMAL LOW (ref 0.50–1.10)
GFR calc Af Amer: >90 mL/min (ref >90)
GFR calc non Af Amer: >90 mL/min (ref >90)
Glucose, Bld: 126 mg/dL — ABNORMAL HIGH (ref 70–99)
Potassium: 4.7 mEq/L (ref 3.7–5.3)
Sodium: 137 mEq/L (ref 137–147)

## 2013-12-02 LAB — GLUCOSE, CAPILLARY
GLUCOSE-CAPILLARY: 150 mg/dL — AB (ref 70–99)
Glucose-Capillary: 144 mg/dL — ABNORMAL HIGH (ref 70–99)
Glucose-Capillary: 147 mg/dL — ABNORMAL HIGH (ref 70–99)
Glucose-Capillary: 162 mg/dL — ABNORMAL HIGH (ref 70–99)
Glucose-Capillary: 183 mg/dL — ABNORMAL HIGH (ref 70–99)
Glucose-Capillary: 99 mg/dL (ref 70–99)

## 2013-12-02 LAB — CBC
HCT: 32.3 % — ABNORMAL LOW (ref 36.0–46.0)
HEMOGLOBIN: 10.9 g/dL — AB (ref 12.0–15.0)
MCH: 32.2 pg (ref 26.0–34.0)
MCHC: 33.7 g/dL (ref 30.0–36.0)
MCV: 95.3 fL (ref 78.0–100.0)
Platelets: 202 10*3/uL (ref 150–400)
RBC: 3.39 MIL/uL — AB (ref 3.87–5.11)
RDW: 13.8 % (ref 11.5–15.5)
WBC: 17.9 10*3/uL — AB (ref 4.0–10.5)

## 2013-12-02 LAB — LACTATE DEHYDROGENASE: LDH: 247 U/L (ref 94–250)

## 2013-12-02 MED ORDER — LEVETIRACETAM 500 MG PO TABS
500.0000 mg | ORAL_TABLET | Freq: Two times a day (BID) | ORAL | Status: DC
  Administered 2013-12-02 – 2013-12-06 (×9): 500 mg via ORAL
  Filled 2013-12-02 (×10): qty 1

## 2013-12-02 NOTE — Progress Notes (Signed)
Katie [name] was discussed at our CNS tumor board today. She has been released by Dr. Ellene Route to commence RT to her Whole Brain. She will start RT tomorrow.  Plan to deliver 10 fractions.  -----------------------------------  Eppie Gibson, MD

## 2013-12-02 NOTE — Evaluation (Signed)
Speech Language Pathology Evaluation Patient Details Name: Katie [name] MRN: [id number] DOB: [birth year] Today's Date: 12/02/2013 Time: 0932-6712 SLP Time Calculation (min): 50 min  Problem List:  Patient Active Problem List   Diagnosis Date Noted  . 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 Malnutrition of moderate degree 11/19/2013  . Small cell lung cancer 11/18/2013  . HTN (hypertension) 11/18/2013  . Steroid-induced hyperglycemia 11/18/2013  . Fall with head trauma 11/18/2013  . Underweight 11/18/2013  . Protein-calorie malnutrition, severe 11/18/2013  . 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 brain metastases 11/15/2013  . Acute respiratory failure 11/12/2013  . Altered mental status 11/12/2013  . Syncope secondary to brain metastasis 11/11/2013   Past Medical History:  Past Medical History  Diagnosis Date  . Anxiety     takes lexapro   Past Surgical History:  Past Surgical History  Procedure Laterality Date  . Appendectomy    . Colon surgery      polyps  removed  . Craniotomy Right 11/12/2013    Procedure: RIGHT PARIETAL CRANIOTOMY;  Surgeon: Kristeen Miss, MD;  Location: [place] [facility];  Service: Neurosurgery;  Laterality: Right;  . Craniotomy Right 11/22/2013    Procedure: Revision of Right Parietal occipital wound due to CSF Leak;  Surgeon: Kristeen Miss, MD;  Location: [place] [facility];  Service: Neurosurgery;  Laterality: Right;  Revision of Right Parietal occipital wound due to CSF Leak   HPI:  63 year old female admitted 11/11/13 for craniotomy and resection of right fronto-parietal tumor.  PMH insignificant.  Pt also has LLL mass c/w tumor.  MBS Tuesday 4/6 with rec for dys 3/nectar. Silent aspiration of thin liquids noted   Assessment / Plan / Recommendation Clinical Impression  Pt. exhibits moderate cognitive changes including a significant change in her personality, per family (Husband and sister), who describe her normally as shy and reserved.  Currently pt. is verbose,  impulsive, and has significant attention and memory deficits, as well as decreased inhibitions, problem solving, judgement, and awareness.  Pt. may have visual impairments as well (question diploplia), keeping her eyes closed unless instructed to open them (and then only does so briefly).  Pt. is able to read and follow 1 step commands, but was unable to write her name ledgibly.  Pt. was oriented only to self (using her maiden name), stating it's 1958, and place "the green room."  Receptive and expressive language is intact.    SLP Assessment  Patient needs continued Speech Lanaguage Pathology Services    Follow Up Recommendations  24 hour supervision/assistance    Frequency and Duration min 2x/week  2 weeks   Pertinent Vitals/Pain No complaints, VSS   SLP Goals  SLP Goals Potential to Achieve Goals: Fair Potential Considerations: Co-morbidities;Medical prognosis Progress/Goals/Alternative treatment plan discussed with pt/caregiver and they: Agree  SLP Evaluation Prior Functioning  Cognitive/Linguistic Baseline: Within functional limits Type of Home: House  Lives With: Spouse Available Help at Discharge: Family;Available 24 hours/day Vocation:  (worked in Her[employer])   Cognition  Overall Cognitive Status: Impaired/Different from baseline Arousal/Alertness: Awake/alert Orientation Level: Oriented to person;Disoriented to place;Disoriented to time;Disoriented to situation (Uses maiden name) Attention: Focused Focused Attention: Impaired Focused Attention Impairment: Verbal basic;Functional basic Memory: Impaired Memory Impairment: Storage deficit;Decreased recall of new information;Decreased short term memory Decreased Short Term Memory: Verbal basic;Functional basic Awareness: Impaired Awareness Impairment: Intellectual impairment;Emergent impairment;Anticipatory impairment Problem Solving: Impaired Problem Solving Impairment: Verbal basic;Functional basic Behaviors:  Impulsive;Perseveration Safety/Judgment: Impaired    Comprehension  Auditory Comprehension Overall Auditory Comprehension: Appears  within functional limits for tasks assessed Yes/No Questions: Within Functional Limits Commands: Within Functional Limits Conversation: Complex Interfering Components: Attention;Visual impairments;Working memory EffectiveTechniques: Repetition Retail banker: Not tested Reading Comprehension Reading Status: Impaired Word level: Within functional limits Sentence Level: Within functional limits Paragraph Level: Impaired Functional Environmental (signs, name badge): Within functional limits Interfering Components: Attention;Impulsivity;Visual acuity;Working memory;Visual perceptual Effective Techniques: Verbal cueing;Tactile cueing    Expression Expression Primary Mode of Expression: Verbal Verbal Expression Overall Verbal Expression: Appears within functional limits for tasks assessed Initiation: No impairment Level of Generative/Spontaneous Verbalization: Conversation Repetition: No impairment Naming: No impairment Pragmatics: Impairment Impairments: Abnormal affect;Eye contact;Interpretation of nonverbal communication;Topic appropriateness;Turn Taking Interfering Components: Attention Non-Verbal Means of Communication: Not applicable Written Expression Dominant Hand: Right Written Expression: Exceptions to [facility] Trace Ability: Word Copy Ability: Word Self Formulation Ability: Word   Oral / Motor Oral Motor/Sensory Function Overall Oral Motor/Sensory Function: Appears within functional limits for tasks assessed Motor Speech Overall Motor Speech: Appears within functional limits for tasks assessed Respiration: Within functional limits Phonation: Normal Resonance: Within functional limits Articulation: Within functional limitis Intelligibility: Intelligible Motor Planning: Witnin functional limits Motor Speech  Errors: Not applicable Interfering Components: Hearing loss   GO     Joaquim Nam 12/02/2013, 12:06 PM

## 2013-12-02 NOTE — Progress Notes (Signed)
Speech Language Pathology Treatment: Dysphagia  Patient Details Name: Katie [name] MRN: [id number] DOB: 1951-01-04 Today's Date: 12/02/2013 Time: 7290-2111 SLP Time Calculation (min): 23 min  Assessment / Plan / Recommendation Clinical Impression  Pt. Appears to be tolerating a Dysphagia 3 diet with Nectar thick liquids without s/s of aspiration.  Pt. States she is "starving" and family reports she is eating 100% of meals, and constantly requests more (on steroids).  Pt. Had silent aspiration on the previous MBS, therefore, a repeat MBS is indicated to advance to thin liquids. Pt. Is transferring to [facility] for radiation and chemo.  Recommend f/u MBS 4/14 for possible diet advancement.  Family agrees.   MD, please order MBS for 4/14.  Thank you!   HPI HPI: 63 year old female admitted 11/11/13 for craniotomy and resection of right fronto-parietal tumor.  PMH insignificant.  Pt also has LLL mass c/w tumor.  MBS Tuesday 4/6 with rec for dys 3/nectar. Silent aspiration of thin liquids noted   Pertinent Vitals LS: diminished, clear; afebrile; eating 100%  SLP Plan  Continue with current plan of care;MBS    Recommendations Diet recommendations: Dysphagia 3 (mechanical soft);Nectar-thick liquid Liquids provided via: Cup;No straw Medication Administration: Crushed with puree Supervision: Trained caregiver to feed patient;Full supervision/cueing for compensatory strategies Compensations: Slow rate;Small sips/bites Postural Changes and/or Swallow Maneuvers: Seated upright 90 degrees              Oral Care Recommendations: Oral care BID Follow up Recommendations: 24 hour supervision/assistance Plan: Continue with current plan of care;MBS    GO     Joaquim Nam 12/02/2013, 12:11 PM

## 2013-12-02 NOTE — Progress Notes (Signed)
  Radiation Oncology         [phone]) [phone] ________________________________  Name: Katie [name] MRN: [id number]  Date: 11/11/2013  DOB: [birth year]  SIMULATION AND TREATMENT PLANNING NOTE  Inpatient  DIAGNOSIS:  Brain metastases  NARRATIVE:  The patient was brought to the [facility] [place].  Identity was confirmed.  All relevant records and images related to the planned course of therapy were reviewed.  The patient freely provided informed written consent to proceed with treatment after reviewing the details related to the planned course of therapy. The consent form was witnessed and verified by the simulation staff.    Then, the patient was set-up in a stable reproducible  supine position for radiation therapy.  CT images were obtained.  Surface markings were placed.  The CT images were loaded into the planning software.    TREATMENT PLANNING NOTE: Treatment planning then occurred.  The radiation prescription was entered and confirmed.    A total of 3 medically necessary complex treatment devices were fabricated and supervised by me - aquaplast mask and 2 fields with MLCs to block eyes/lenses/throat. I have requested : Isodose Plan.   The patient will receive 30 Gy in 10 fractions to the whole brain.    -----------------------------------  Eppie Gibson, MD

## 2013-12-02 NOTE — Progress Notes (Signed)
Physical Therapy Treatment Patient Details Name: Katie [name] MRN: [id number] DOB: 1950-12-12 Today's Date: 4/[name]/Katie [name]    History of Present Illness 63 y.o. F s/p R parietal occipital craniotomy and resection of brain tumor 3/24.  On 4/2 she was found to have CSF leak from bottom of incision that needed revision with drain put in place.She was transferred to [facility] and  the procedure took place on 4/3.     PT Comments    Pt con't to demo impaired safety awareness, sequencing, and command follow. Pt con't to require maxAx2 for safe OOB mobility and ambulation. Pt remains to require max v/c's to maintain eye opening even though she is "awake" and answers questions pt constantly keeps eyes closed. Con't to recommend CIR upon d/c for maximal functional recovery.   Follow Up Recommendations  CIR;Supervision/Assistance - 24 hour     Equipment Recommendations       Recommendations for Other Services       Precautions / Restrictions Precautions Precautions: Fall Precaution Comments: impulsive Restrictions Weight Bearing Restrictions: No    Mobility  Bed Mobility Overal bed mobility: Needs Assistance Bed Mobility: Supine to Sit     Supine to sit: Max assist     General bed mobility comments: max assist to facilitate transfer due to pt inabiltiy to follow commands or comprehend  task asked.   Transfers Overall transfer level: Needs assistance Equipment used: 2 person hand held assist Transfers: Sit to/from Stand Sit to Stand: Max assist;+2 physical assistance         General transfer comment: manual facilitation for anterior weight shift and sequencing of transfer. pt impulsively sat down immeadiately upon standing.   Ambulation/Gait Ambulation/Gait assistance: Max assist;+2 physical assistance (3rd person for chair follow, 4th person for IV management) Ambulation [name] (Feet): 60 Feet Assistive device: Rolling walker (2 wheeled);2 person hand held assist Gait  Pattern/deviations: Step-through pattern;Decreased stride length;Shuffle;Narrow base of support     General Gait Details: maximal tactile at posterior bilat hips to promote hip extension and sequencing of appropriate weight-shift and advancement of LEs to sequence stepping. Pt required same assist with and without RW. Pt with bilat knees in hyperestension and retrolpulsion despite maximal verbal and tactile cues.   Stairs            Wheelchair Mobility    Modified Rankin (Stroke Patients Only)       Balance Overall balance assessment: Needs assistance Sitting-balance support: Feet supported;No upper extremity supported Sitting balance-Leahy Scale: Poor Sitting balance - Comments: pt impulsively repeatedly attempted to return to supine in bed   Standing balance support: Bilateral upper extremity supported Standing balance-Leahy Scale: Zero Standing balance comment: pt con't to be retropulsive with bilat LEs in hyperextension.pt pushes self with UEs posteriorly                    Cognition Arousal/Alertness: Awake/alert Behavior During Therapy: Flat affect Overall Cognitive Status: Impaired/Different from baseline Area of Impairment: Orientation;Attention;Memory;Following commands;Safety/judgement;Awareness;Problem solving Orientation Level: Disoriented to;Place;Time;Situation Current Attention Level: Focused Memory: Decreased recall of precautions;Decreased short-term memory (unable to recall date depite freq verbal cues) Following Commands: Follows one step commands inconsistently;Follows one step commands with increased time Safety/Judgement: Decreased awareness of safety;Decreased awareness of deficits Awareness: Intellectual Problem Solving: Slow processing;Decreased initiation;Difficulty sequencing;Requires verbal cues;Requires tactile cues General Comments: pt perseverates on words and derives own personality of medical team. ie. student PT was a [place]  comments (skin integrity, edema, etc.): pt reports she had to urinate despite having foley but then found her soiled in urine due to foley misfunction. Pt unable to comprehend or sequence steps for hygiene      Pertinent Vitals/Pain Denies pain    Home Living                      Prior Function            PT Goals (current goals can now be found in the care plan section) Acute Rehab PT Goals Patient Stated Goal: none stated Progress towards PT goals: Progressing toward goals    Frequency  Min 4X/week    PT Plan Current plan remains appropriate    Co-evaluation             End of Session Equipment Utilized During Treatment: Gait belt Activity Tolerance: Patient tolerated treatment well Patient left: with call bell/phone within reach;with chair alarm set;with family/visitor present     Time: 7124-5809 PT Time Calculation (min): 38 min  Charges:  $Gait Training: 23-37 mins $Therapeutic Activity: 8-22 mins                    G CodesKoleen [name] Katie [name] Katie [name], Katie [name], 9:54 AM  Katie [name], PT, DPT Pager #: [phone] Office #: [phone]

## 2013-12-02 NOTE — Progress Notes (Signed)
Patient was prepared for transport at 11:00 am. Report called to [facility] nurse with instructions from SLP for the patient's diet and feeding. Patient picked up for transport by [facility] at 11:15 am.

## 2013-12-02 NOTE — Progress Notes (Signed)
Subjective: Patient reports Feels better. Still rather impulsive. Incision remains dry without leakage  Objective: Vital signs in last 24 hours: Temp:  [98 F (36.7 C)-99.2 F (37.3 C)] 98 F (36.7 C) (04/13 0800) Pulse Rate:  [59-68] 59 (04/13 0800) Resp:  [12-18] 13 (04/13 0800) BP: (95-122)/(49-62) 113/56 mmHg (04/13 0800) SpO2:  [96 %-100 %] 100 % (04/13 0800) Weight:  [53.4 kg (117 lb 11.6 oz)] 53.4 kg (117 lb 11.6 oz) (04/13 0600)  Intake/Output from previous day: 04/12 0701 - 04/13 0700 In: 1320 [P.O.:890; I.V.:220; IV Piggyback:210] Out: 1250 [Urine:1250] Intake/Output this shift: Total I/O In: 10 [I.V.:10] Out: -   Incision is dry no CSF leakage.  Lab Results:  Recent Labs  12/01/13 0305 12/02/13 0415  WBC 20.1* 17.9*  HGB 12.1 10.9*  HCT 35.7* 32.3*  PLT 200 202   BMET  Recent Labs  12/01/13 0305 12/02/13 0415  NA 135* 137  K 4.5 4.7  CL 97 98  CO2 28 26  GLUCOSE 127* 126*  BUN 26* 34*  CREATININE 0.56 0.49*  CALCIUM 8.5 8.2*    Studies/Results: No results found.  Assessment/Plan:  staples removed. A few sutures still in place.   LOS: 21 days  Transferred to [facility] hospital so patient may commence  therapy   Kristeen Miss 12/02/2013, 9:23 AM

## 2013-12-02 NOTE — Progress Notes (Signed)
Mrs. Kovacevic she's been doing a little better. She's been more alert The drain is out from her brain. She is sitting up in chair. Speech Pathology is helping out with her swallowing.  She's talking a little better. She's having no nausea vomiting. There's been no problems with pain control.  Has an occasional headache.  There's been no bleeding. She's had no fever.  She continues on steroids for any kind of swelling.  Her lab work looks pretty decent.  Her vital signs shows her blood pressure to be down a little bit. She is afebrile. Heart rate is 59. Her oral exam does show any thrush. Her lungs are clear. Cardiac exam regular rate and rhythm. There are no murmurs rubs or bruits. Abdomen is soft. Has good bowel sounds. There is no fluid wave. There is no palpable liver or spleen tip. Extremities shows some trace edema in her legs.  It sounds like she will be transferred over to [facility]. Once she is transferred, we will evaluate her for the possibility of therapy. I probably would consider systemic chemotherapy as am not sure she would be able to lie still for radiation.  She will need to have a Port-A-Cath placed and we are going to institute chemotherapy.  Joylene John 5:22-23

## 2013-12-03 ENCOUNTER — Ambulatory Visit
Admit: 2013-12-03 | Discharge: 2013-12-03 | Disposition: A | Discharge status: Home / Self Care | Payer: BC Managed Care – PPO | Attending: Radiation Oncology | Admitting: Radiation Oncology

## 2013-12-03 DIAGNOSIS — C7931 Secondary malignant neoplasm of brain: Secondary | ICD-10-CM

## 2013-12-03 DIAGNOSIS — R4789 Other speech disturbances: Secondary | ICD-10-CM

## 2013-12-03 DIAGNOSIS — C719 Malignant neoplasm of brain, unspecified: Secondary | ICD-10-CM

## 2013-12-03 LAB — GLUCOSE, CAPILLARY
GLUCOSE-CAPILLARY: 108 mg/dL — AB (ref 70–99)
GLUCOSE-CAPILLARY: 154 mg/dL — AB (ref 70–99)
Glucose-Capillary: 127 mg/dL — ABNORMAL HIGH (ref 70–99)
Glucose-Capillary: 135 mg/dL — ABNORMAL HIGH (ref 70–99)
Glucose-Capillary: 142 mg/dL — ABNORMAL HIGH (ref 70–99)

## 2013-12-03 LAB — CBC
HCT: 31.4 % — ABNORMAL LOW (ref 36.0–46.0)
HEMOGLOBIN: 10.7 g/dL — AB (ref 12.0–15.0)
MCH: 32.3 pg (ref 26.0–34.0)
MCHC: 34.1 g/dL (ref 30.0–36.0)
MCV: 94.9 fL (ref 78.0–100.0)
Platelets: 199 10*3/uL (ref 150–400)
RBC: 3.31 MIL/uL — ABNORMAL LOW (ref 3.87–5.11)
RDW: 14 % (ref 11.5–15.5)
WBC: 16.2 10*3/uL — AB (ref 4.0–10.5)

## 2013-12-03 LAB — BASIC METABOLIC PANEL
BUN: 34 mg/dL — AB (ref 6–23)
CHLORIDE: 97 meq/L (ref 96–112)
CO2: 29 meq/L (ref 19–32)
Calcium: 8.1 mg/dL — ABNORMAL LOW (ref 8.4–10.5)
Creatinine, Ser: 0.54 mg/dL (ref 0.50–1.10)
GFR calc non Af Amer: >90 mL/min (ref >90)
Glucose, Bld: 129 mg/dL — ABNORMAL HIGH (ref 70–99)
POTASSIUM: 4.5 meq/L (ref 3.7–5.3)
Sodium: 134 mEq/L — ABNORMAL LOW (ref 137–147)

## 2013-12-03 LAB — LACTATE DEHYDROGENASE: LDH: 262 U/L — ABNORMAL HIGH (ref 94–250)

## 2013-12-03 MED ORDER — PANTOPRAZOLE SODIUM 40 MG PO TBEC
40.0000 mg | DELAYED_RELEASE_TABLET | Freq: Two times a day (BID) | ORAL | Status: DC
  Administered 2013-12-03 – 2013-12-04 (×4): 40 mg via ORAL
  Filled 2013-12-03 (×7): qty 1

## 2013-12-03 MED ORDER — FLUCONAZOLE 100 MG PO TABS
100.0000 mg | ORAL_TABLET | Freq: Every day | ORAL | Status: DC
  Administered 2013-12-03 – 2013-12-06 (×4): 100 mg via ORAL
  Filled 2013-12-03 (×4): qty 1

## 2013-12-03 MED ORDER — DEXAMETHASONE 6 MG PO TABS
6.0000 mg | ORAL_TABLET | Freq: Three times a day (TID) | ORAL | Status: DC
  Administered 2013-12-03 – 2013-12-06 (×11): 6 mg via ORAL
  Filled 2013-12-03 (×13): qty 1

## 2013-12-03 MED ORDER — ENOXAPARIN SODIUM 40 MG/0.4ML ~~LOC~~ SOLN
40.0000 mg | SUBCUTANEOUS | Status: DC
  Administered 2013-12-03 – 2013-12-04 (×2): 40 mg via SUBCUTANEOUS
  Filled 2013-12-03 (×2): qty 0.4

## 2013-12-03 NOTE — Consult Note (Signed)
Physical Medicine and Rehabilitation Consult  Reason for Consult: Brain metastases.   Referring Physician:  Dr. Charlies Silvers   HPI: Katie [name] is a 63 y.o. female with history of anxiety disorder otherwise in good health. She was admitted via [place] on 11/11/13 past syncopal event and sustained a fall with injury to left frontal region and obtunded at admission. CT brain done demonstrates at least 2 large right-sided lesions one frontally and one parietal occipital area with 2 mm of shift with subfalcine herniation. Husband reported  6 week history of confusion with disorientation and falls. MRI brain with 4.4 x 6 cm right temporal parietal occipital mass with extensive vasogenic edema and high right frontal lobe mass 2.3 X 2.3 cm likely metastatic disease or possibly primary brain tumors as well as 10 mm Right to left shift with hydrocephalus. Patient taken to OR for Right parietal occipital crani for resection of tumor on 11/12/13 by Dr. Ellene Route. 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 CT lungs with LLL mass with subcarinal adenopathy. She was extubated without difficulty on 11/15/13. She was transferred to [facility] for Brain XRT as recommended by Dr. Isidore Moos. She was evaluated by Dr. Marin Olp and chemo recommended for likely micrometastatic disease. She developed CSF leak requiring revision of incision with subgleal drain on 11/22/13. Mentation has fluctuated and she was placed on Vancomycin for HCAP. Drain removed but patient with continued subgaleal fluid collections therefore LP done with resolution. She was transferred back to [facility] and  and whole brain XRT to be initiated today.   Patient has had improvement in mentation but is easily distracted, verbose, impulsive with deficits in problem solving, judgement, awareness and memory. She is oriented to self only. Patient with poor posture, question diplopia as well as ataxic gait with poor motor control. MBS done with  evidence of aspiration of thin liquids and patient on Dysphagia 3, nectar liquids. Therapy team recommending CIR for further therapies.   Review of Systems  Unable to perform ROS: mental acuity    Past Medical History  Diagnosis Date  . Anxiety     takes lexapro   Past Surgical History  Procedure Laterality Date  . Appendectomy    . Colon surgery      polyps  removed  . Craniotomy Right 11/12/2013    Procedure: RIGHT PARIETAL CRANIOTOMY;  Surgeon: Kristeen Miss, MD;  Location: [place] [facility];  Service: Neurosurgery;  Laterality: Right;  . Craniotomy Right 11/22/2013    Procedure: Revision of Right Parietal occipital wound due to CSF Leak;  Surgeon: Kristeen Miss, MD;  Location: [place] [facility];  Service: Neurosurgery;  Laterality: Right;  Revision of Right Parietal occipital wound due to CSF Leak   History reviewed. No pertinent family history.  Social History:  Married.   Per reports that she has been smoking--1 1/2 PPD?Marland Kitchen  She does not have any smokeless tobacco history on file. Her alcohol and drug histories are not on file.  Allergies: No Known Allergies  Medications Prior to Admission  Medication Sig Dispense Refill  . aspirin EC 81 MG tablet Take 81 mg by mouth daily.      Marland Kitchen atorvastatin (LIPITOR) 10 MG tablet Take 10 mg by mouth every evening.      . cyanocobalamin 500 MCG tablet Take 500 mcg by mouth daily.      Marland Kitchen escitalopram (LEXAPRO) 10 MG tablet Take 10 mg by mouth every morning.  Home: Home Living Family/patient expects to be discharged to:: Private residence Living Arrangements: Spouse/significant other Available Help at Discharge: Family;Available 24 hours/day Type of Home: House Home Access: Stairs to enter [employer] of Steps: 3 Entrance Stairs-Rails: None Home Layout: Two level;Able to live on main level with bedroom/bathroom Alternate Level Stairs-Number of Steps: one flight Alternate Level Stairs-Rails: Right Home Equipment: Walker - 2  wheels;Wheelchair - manual Additional Comments: pt's husband provided home living info and states he will be available 24/7 at home.   Lives With: Spouse  Functional History: Prior Function Level of Independence: Independent Functional Status:  Mobility: Bed Mobility Overal bed mobility: Needs Assistance Bed Mobility: Supine to Sit Supine to sit: Max assist General bed mobility comments: increased time and MAX cueing to stay on task and increase level of self assist.    Transfers Overall transfer level: Needs assistance Equipment used: 2 person hand held assist;Rolling walker (2 wheeled) Transfers: Sit to/from Stand Sit to Stand: Max assist;+2 physical assistance;+2 safety/equipment Stand pivot transfers: +2 physical assistance General transfer comment: Required MAX VC's and hand over hand cueing plus tactile cueing to complete turn to toilet. Ambulation/Gait Ambulation/Gait assistance: Max assist;+2 physical assistance (3rd person for chair follow, 4th person for IV management) Ambulation Distance (Feet): 40 Feet (20 feet x 2 to and from BR) Assistive device: Rolling walker (2 wheeled);2 person hand held assist Gait Pattern/deviations: Step-to pattern;Trunk flexed;Narrow base of support;Decreased step length - right;Decreased step length - left;Ataxic;Staggering left;Staggering right Gait velocity: decreased General Gait Details: poor gross motor control and self righting.  Required hand over hand cueing and posterior trunk support to facilite weight shift and progress gait.  Attempted amb with RW pt demon increased pushing of walker without stepping forward.  HIGH FALL RISK.     ADL: ADL Overall ADL's : Needs assistance/impaired Eating/Feeding: Moderate assistance;Cueing for sequencing;Cueing for safety;Sitting Eating/Feeding Details (indicate cue type and reason): Pt requires hand over hand at times to self feed due to impulsiveness and perseveration. physical cues for bite size  and speed. Demonstrated to husband technique for safe feeding. Pt verbalized understanding. discussed need to minimize any distractions during meals due to poor attention and distractability. Grooming: Oral care;Wash/dry face;Moderate assistance;Cueing for safety;Cueing for sequencing Grooming Details (indicate cue type and reason): Pt had difficult time recognizing toothpast. Mod cues for proper sequencing. Used biotene to rinse with sponge and spit in cup. Pt required hand over hand for impulse control . Educated husbna don how to work with pt's deficits in order to increase her independce with self care. Upper Body Bathing: Maximal assistance;Sitting Lower Body Bathing: Total assistance;+2 for physical assistance;+2 for safety/equipment;Sit to/from stand Upper Body Dressing : Maximal assistance Lower Body Dressing: Total assistance;Sit to/from stand Toilet Transfer: Maximal assistance;Stand-pivot;Cueing for safety Toileting- Clothing Manipulation and Hygiene: Total assistance Functional mobility during ADLs: Maximal assistance General ADL Comments: Pt shutting R eye at times when using near vision. Husband states she did this at times before hospitalization but not like she is doing now.   Cognition: Cognition Overall Cognitive Status: Impaired/Different from baseline Arousal/Alertness: Awake/alert Orientation Level: Oriented to person;Disoriented to place;Disoriented to time;Disoriented to situation Attention: Focused Focused Attention: Impaired Focused Attention Impairment: Verbal basic;Functional basic Memory: Impaired Memory Impairment: Storage deficit;Decreased recall of new information;Decreased short term memory Decreased Short Term Memory: Verbal basic;Functional basic Awareness: Impaired Awareness Impairment: Intellectual impairment;Emergent impairment;Anticipatory impairment Problem Solving: Impaired Problem Solving Impairment: Verbal basic;Functional basic Behaviors:  Impulsive;Perseveration Safety/Judgment: Impaired Cognition Arousal/Alertness: Lethargic (cues to open eyes)  Behavior During Therapy: Restless;Flat affect Overall Cognitive Status: Impaired/Different from baseline Area of Impairment: Orientation;Attention;Memory;Following commands;Safety/judgement;Awareness;Problem solving Orientation Level: Disoriented to;Place;Time;Situation Current Attention Level: Focused (internally distracted) Memory: Decreased short-term memory;Decreased recall of precautions Following Commands: Follows one step commands inconsistently Safety/Judgement: Decreased awareness of safety;Decreased awareness of deficits Awareness: Intellectual Problem Solving: Slow processing;Difficulty sequencing;Requires verbal cues;Requires tactile cues General Comments: perseveration  Blood pressure 101/50, pulse 66, temperature 98.2 F (36.8 C), temperature source Axillary, resp. rate 20, height 5\' 6"  (1.676 m), weight 54.4 kg (119 lb 14.9 oz), SpO2 97.00%. Physical Exam  Constitutional: No distress.  Thing/frail appearing  Neck: No JVD present. No tracheal deviation present. No thyromegaly present.  Cardiovascular: Normal rate.   Respiratory: Effort normal.  GI: She exhibits no distension. There is no tenderness.  Neurological:  Pt very distracted confused. Speech dysarthric at times. Left HH. Moves all 4's fairly easily. Very impulsive and needs constant redirection  Skin:  Scalp incision/fullness noted on right    Results for orders placed during the hospital encounter of 11/11/13 (from the past 24 hour(s))  GLUCOSE, CAPILLARY     Status: Abnormal   Collection Time    12/02/13  4:14 PM      Result Value Ref Range   Glucose-Capillary 162 (*) 70 - 99 mg/dL   Comment 1 Documented in Chart     Comment 2 Notify RN    GLUCOSE, CAPILLARY     Status: Abnormal   Collection Time    12/02/13  8:08 PM      Result Value Ref Range   Glucose-Capillary 147 (*) 70 - 99 mg/dL    GLUCOSE, CAPILLARY     Status: Abnormal   Collection Time    12/03/13 12:00 AM      Result Value Ref Range   Glucose-Capillary 154 (*) 70 - 99 mg/dL  GLUCOSE, CAPILLARY     Status: Abnormal   Collection Time    12/03/13  3:46 AM      Result Value Ref Range   Glucose-Capillary 127 (*) 70 - 99 mg/dL  LACTATE DEHYDROGENASE     Status: Abnormal   Collection Time    12/03/13  3:55 AM      Result Value Ref Range   LDH 262 (*) 94 - 250 U/L  BASIC METABOLIC PANEL     Status: Abnormal   Collection Time    12/03/13  3:55 AM      Result Value Ref Range   Sodium 134 (*) 137 - 147 mEq/L   Potassium 4.5  3.7 - 5.3 mEq/L   Chloride 97  96 - 112 mEq/L   CO2 29  19 - 32 mEq/L   Glucose, Bld 129 (*) 70 - 99 mg/dL   BUN 34 (*) 6 - 23 mg/dL   Creatinine, Ser 0.54  0.50 - 1.10 mg/dL   Calcium 8.1 (*) 8.4 - 10.5 mg/dL   GFR calc non Af Amer >90  >90 mL/min   GFR calc Af Amer >90  >90 mL/min  CBC     Status: Abnormal   Collection Time    12/03/13  3:55 AM      Result Value Ref Range   WBC 16.2 (*) 4.0 - 10.5 K/uL   RBC 3.31 (*) 3.87 - 5.11 MIL/uL   Hemoglobin 10.7 (*) 12.0 - 15.0 g/dL   HCT 31.4 (*) 36.0 - 46.0 %   MCV 94.9  78.0 - 100.0 fL   MCH 32.3  26.0 - 34.0 pg  MCHC 34.1  30.0 - 36.0 g/dL   RDW 14.0  11.5 - 15.5 %   Platelets 199  150 - 400 K/uL  GLUCOSE, CAPILLARY     Status: Abnormal   Collection Time    12/03/13  7:37 AM      Result Value Ref Range   Glucose-Capillary 108 (*) 70 - 99 mg/dL   Comment 1 Documented in Chart     Comment 2 Notify RN    GLUCOSE, CAPILLARY     Status: Abnormal   Collection Time    12/03/13 11:56 AM      Result Value Ref Range   Glucose-Capillary 135 (*) 70 - 99 mg/dL   Comment 1 Documented in Chart     Comment 2 Notify RN     No results found.  Assessment/Plan: Diagnosis: right temporal occipital metastatic tumor (SCLC) 1. Does the need for close, 24 hr/day medical supervision in concert with the patient's rehab needs make it unreasonable  for this patient to be served in a less intensive setting? Yes 2. Co-Morbidities requiring supervision/potential complications: htn, poor nutrition 3. Due to bladder management, bowel management, safety, skin/wound care, disease management, medication administration, pain management and patient education, does the patient require 24 hr/day rehab nursing? Yes 4. Does the patient require coordinated care of a physician, rehab nurse, PT (1-2 hrs/day, 5 days/week), OT (1-2 hrs/day, 5 days/week) and SLP (1-2 hrs/day, 5 days/week) to address physical and functional deficits in the context of the above medical diagnosis(es)? Yes Addressing deficits in the following areas: balance, endurance, locomotion, strength, transferring, bowel/bladder control, bathing, dressing, feeding, grooming, toileting, cognition, speech, swallowing and psychosocial support 5. Can the patient actively participate in an intensive therapy program of at least 3 hrs of therapy per day at least 5 days per week? Potentially 6. The potential for patient to make measurable gains while on inpatient rehab is good 7. Anticipated functional outcomes upon discharge from inpatient rehab are supervision  with PT, supervision and min assist with OT, supervision and min assist with SLP. 8. Estimated rehab length of stay to reach the above functional goals is: 13-20 days 9. Does the patient have adequate social supports to accommodate these discharge functional goals? Yes and Potentially 10. Anticipated D/C setting: Home 11. Anticipated post D/C treatments: HH therapy and Outpatient therapy 12. Overall Rehab/Functional Prognosis: good  RECOMMENDATIONS: This patient's condition is appropriate for continued rehabilitative care in the following setting: CIR Patient has agreed to participate in recommended program. Potentially Note that insurance prior authorization may be required for reimbursement for recommended care.  Comment: Spoke with  patient's husband. He was a little frustrated by the thought of returning BACK to [facility] for inpatient rehab after they just moved to [facility] for onc care. He does understand that she would benefit from our program. We will follow along for progress----she is to receive first session of XRT today  Meredith Staggers, MD, [place] [facility]     12/03/2013

## 2013-12-03 NOTE — Progress Notes (Signed)
[place] [facility] Therapy Treatment Record Phone [phone]   Radiation Therapy was administered to Katie [name] on: 12/03/2013  3:13 PM and was treatment # 1 out of a planned course of  10 treatments.

## 2013-12-03 NOTE — Progress Notes (Addendum)
Speech Language Pathology Treatment: Dysphagia  Patient Details Name: Katie [name] MRN: [id number] DOB: 1951/07/10 Today's Date: 12/03/2013 Time: 8676-7209 SLP Time Calculation (min): 10 min  Assessment / Plan / Recommendation Clinical Impression  Pt seen since transferred from [facility] hospital back to [facility] for initiation of radiation.  Pt sitting upright in chair with spouse present.  Intake has been 100% with great po tolerance.  Spouse reports good tolerance of po diet.    SLP was informed by PTA that pt requested "clear liquids"  - ? Requesting thin liquids.  Today pt denies displeasure with nectar thick liquid and has been tolerating well.  Discussion re: options to repeat MBS due to silent nature of aspiration without adequate clearance.  Feasible option may also be to defer repeat MBS due to pt initiation of whole brain radiation that may impact swallowing ability and pt adequacy of intake.  Posted signs in room for swallow precautions/diet given signs did not transfer from [facility].    Educated spouse, pt to recommendations and importance for pt to stay hydrated.    Advised spouse to ability to thicken other liquids if pt begins to dislike thickened.      HPI HPI: 63 year old female admitted 11/11/13 for craniotomy and resection of right fronto-parietal tumor.  PMH insignificant.  Pt also has LLL mass c/w tumor.  MBS Tuesday 4/6 with rec for dys 3/nectar. Silent aspiration of thin liquids noted.  SlP follow up to determine tolerance of po diet, pt underwent cognitive evaluation and was found to have significant deficits.    Pertinent Vitals Afebrile, decreased   SLP Plan  Continue with current plan of care;MBS (mbs when indicated)    Recommendations Diet recommendations: Dysphagia 3 (mechanical soft);Nectar-thick liquid Liquids provided via: Cup Medication Administration: Crushed with puree Supervision: Trained caregiver to feed patient;Full supervision/cueing for compensatory  strategies Compensations: Slow rate;Small sips/bites Postural Changes and/or Swallow Maneuvers: Seated upright 90 degrees              Oral Care Recommendations: Oral care BID Follow up Recommendations: 24 hour supervision/assistance Plan: Continue with current plan of care;MBS (mbs when indicated)    GO     Luanna Salk, MS [facility] SLP [phone]   SLP called MD office and left message with RN for Dr Marin Olp.  Await return call, POC.  Thanks.

## 2013-12-03 NOTE — Care Management Note (Signed)
Cm spoke with patient at the bedside concerning discharge planning. PT recommends CIR. [name] spouse unaware of this recommendation. Per pt's spouse Dr. Marin Olp plans to keep patient in acute setting during duration of radiation treatments. [name] spouse unsure at this time what pt's disposition will entail. Per pt's spouse aware pt requires maximum assistance with ADLS. Awaiting return call from physician concerning plan of care for pt.    Venita Lick Ephram Kornegay,MSN,RN [phone]

## 2013-12-03 NOTE — Progress Notes (Signed)
Inpatient Rehabilitation  I met with the patient and her niece McKenzie at the bedside to discuss post acute rehab options.  I will monitor her course of treatment and appropriateness/reaadiness for CIR.   Please call if questions.  [facility] Admissions Coordinator Cell [phone] Office [phone]

## 2013-12-03 NOTE — Progress Notes (Signed)
Physical Therapy Treatment Patient Details Name: Katie [name] MRN: [id number] DOB: 04-Jun-1951 Today's Date: 12/03/2013    History of Present Illness 63 y.o. F s/p R parietal occipital craniotomy and resection of brain tumor 3/24.     PT Comments    Assisted pt OOB to amb to and from BR required increased time and + 2 for safety.  Pt easily distracted and hyper talkative with some appropriate conversation and difficulty with some expressive words.  Poor gross motor control and poor self righting balance.  Very ataxic gait.  HIGH FALL RISK.   Follow Up Recommendations  CIR     Equipment Recommendations       Recommendations for Other Services Rehab consult     Precautions / Restrictions Precautions Precautions: Fall Precaution Comments: impulsive, poor safety cognition Restrictions Weight Bearing Restrictions: No    Mobility  Bed Mobility Overal bed mobility: Needs Assistance Bed Mobility: Supine to Sit     Supine to sit: Max assist     General bed mobility comments: increased time and MAX cueing to stay on task and increase level of self assist.     Transfers Overall transfer level: Needs assistance Equipment used: 2 person hand held assist;Rolling walker (2 wheeled) Transfers: Sit to/from Stand Sit to Stand: Max assist;+2 physical assistance;+2 safety/equipment         General transfer comment: Required MAX VC's and hand over hand cueing plus tactile cueing to complete turn to toilet.  Ambulation/Gait   Ambulation Distance (Feet): 40 Feet (20 feet x 2 to and from BR) Assistive device: Rolling walker (2 wheeled);2 person hand held assist Gait Pattern/deviations: Step-to pattern;Trunk flexed;Narrow base of support;Decreased step length - right;Decreased step length - left;Ataxic;Staggering left;Staggering right Gait velocity: decreased   General Gait Details: poor gross motor control and self righting.  Required hand over hand cueing and posterior trunk support to  facilite weight shift and progress gait.  Attempted amb with RW pt demon increased pushing of walker without stepping forward.  HIGH FALL RISK.    Stairs            Wheelchair Mobility    Modified Rankin (Stroke Patients Only)       Balance Overall balance assessment: Needs assistance Sitting-balance support: Feet supported;Bilateral upper extremity supported Sitting balance-Leahy Scale: Fair     Standing balance support: Bilateral upper extremity supported;During functional activity Standing balance-Leahy Scale: Zero                      Cognition Arousal/Alertness: Lethargic (cues to open eyes) Behavior During Therapy: Restless;Flat affect Overall Cognitive Status: Impaired/Different from baseline Area of Impairment: Orientation;Attention;Memory;Following commands;Safety/judgement;Awareness;Problem solving Orientation Level: Disoriented to;Place;Time;Situation Current Attention Level: Focused (internally distracted) Memory: Decreased short-term memory;Decreased recall of precautions Following Commands: Follows one step commands inconsistently Safety/Judgement: Decreased awareness of safety;Decreased awareness of deficits Awareness: Intellectual Problem Solving: Slow processing;Difficulty sequencing;Requires verbal cues;Requires tactile cues General Comments: perseveration    Exercises Other Exercises Other Exercises: Educated husband on convergence exercises to try with pt for brief periods when pt is awake and not restless    General Comments        Pertinent Vitals/Pain     Home Living                      Prior Function            PT Goals (current goals can now be found in the care plan section) Acute Rehab PT  Goals Patient Stated Goal: none stated Progress towards PT goals: Progressing toward goals    Frequency       PT Plan      Co-evaluation             End of Session Equipment Utilized During Treatment: Gait  belt Activity Tolerance: Patient tolerated treatment well Patient left: in chair;with call bell/phone within reach;with family/visitor present;with chair alarm set     Time: 3545-6256 PT Time Calculation (min): 30 min  Charges:  $Gait Training: 8-22 mins $Therapeutic Activity: 8-22 mins                    G Codes:      Rica Koyanagi  PTA [facility]  Acute  Rehab Pager      [phone]

## 2013-12-03 NOTE — Progress Notes (Signed)
Spoke with Dr. Antonieta Pert nurse. Dr. Marin Olp said he will be the attending for Katie [name]. He will call [facility] if he needs assistance. I spoke with Dr. Clarice Pole nurse this am as there was more swelling at the suture removal site. She will let him know and I have provided my cell phone number to reach me once he is available.  Leisa Lenz [facility] [phone] or [phone]

## 2013-12-03 NOTE — Progress Notes (Signed)
Occupational Therapy Treatment Patient Details Name: Katie [name] MRN: [id number] DOB: 04/02/1951 Today's Date: 12/03/2013    History of present illness 63 y.o. F s/p R parietal occipital craniotomy and resection of brain tumor 3/24.  On 4/2 she was found to have CSF leak from bottom of incision that needed revision with drain put in place.She was transferred to [facility] and  the procedure took place on 4/3.    OT comments  Pt making progress, although she continues to demonstrate significant deficits with postural control, coordination, overall strength, cognition and vision, which decreases her independence with ADL and mobility. Session focused on educating husband on techniques to use with pt to increase her independence and safety with self feeding and grooming. Pt with possible diplopia in near vision. Began educating husband on convergence exercises. Will further assess as appropriate. Continue to recommend CIR for D/C when medically stable.   Follow Up Recommendations  CIR (when medically stable)    Equipment Recommendations  3 in 1 bedside comode    Recommendations for Other Services Rehab consult    Precautions / Restrictions Precautions Precautions: Fall Precaution Comments: impulsive Restrictions Weight Bearing Restrictions: No       Mobility Bed Mobility                  Transfers Overall transfer level: Needs assistance   Transfers: Sit to/from Stand Sit to Stand: Max assist;+2 physical assistance;+2 safety/equipment         General transfer comment: Pt with head forward and poor postural control. Requires physical cues to facilitate weight shift when walking. Assisted PT at end of session with mobility.    Balance Overall balance assessment: Needs assistance Sitting-balance support: Feet supported;Bilateral upper extremity supported Sitting balance-Leahy Scale: Fair     Standing balance support: Bilateral upper extremity supported;During  functional activity Standing balance-Leahy Scale: Zero                     ADL   Eating/Feeding: Moderate assistance;Cueing for sequencing;Cueing for safety;Sitting Eating/Feeding Details (indicate cue type and reason): Pt requires hand over hand at times to self feed due to impulsiveness and perseveration. physical cues for bite size and speed. Demonstrated to husband technique for safe feeding. Pt verbalized understanding. discussed need to minimize any distractions during meals due to poor attention and distractability. Grooming: Oral care;Wash/dry face;Moderate assistance;Cueing for safety;Cueing for sequencing Grooming Details (indicate cue type and reason): Pt had difficult time recognizing toothpast. Mod cues for proper sequencing. Used biotene to rinse with sponge and spit in cup. Pt required hand over hand for impulse control . Educated husbna don how to work with pt's deficits in order to increase her independce with self care. Upper Body Bathing: Maximal assistance;Sitting   Lower Body Bathing: Total assistance;+2 for physical assistance;+2 for safety/equipment;Sit to/from stand   Upper Body Dressing : Maximal assistance   Lower Body Dressing: Total assistance;Sit to/from stand   Toilet Transfer: Maximal assistance;Stand-pivot;Cueing for safety           Functional mobility during ADLs: Maximal assistance General ADL Comments: Pt shutting R eye at times when using near vision. Husband states she did this at times before hospitalization but not like she is doing now.       Vision                 Additional Comments: pt closing R eye in near vision tasks. R eye appears to have difficulty with adduction. Pt shutting R  eye (note pt is over/undershooting when reaching for objects)   Perception     Praxis      Cognition   Behavior During Therapy: Restless;Flat affect Overall Cognitive Status: Impaired/Different from baseline Area of Impairment:  Orientation;Attention;Memory;Following commands;Safety/judgement;Awareness;Problem solving Orientation Level: Disoriented to;Place;Time;Situation Current Attention Level: Focused (internally distracted) Memory: Decreased short-term memory;Decreased recall of precautions  Following Commands: Follows one step commands inconsistently Safety/Judgement: Decreased awareness of safety;Decreased awareness of deficits Awareness: Intellectual Problem Solving: Slow processing;Difficulty sequencing;Requires verbal cues;Requires tactile cues General Comments: perseveration    Extremity/Trunk Assessment               Exercises Other Exercises Other Exercises: Educated husband on convergence exercises to try with pt for brief periods when pt is awake and not restless   Shoulder Instructions       General Comments      Pertinent Vitals/ Pain       C/o HA - did not rate - nsg notified   Home Living      see eval                                    Prior Functioning/Environment     see eval         Frequency Min 3X/week     Progress Toward Goals  OT Goals(current goals can now be found in the care plan section)  Progress towards OT goals: Progressing toward goals  Acute Rehab OT Goals Patient Stated Goal: none stated OT Goal Formulation: With family Time For Goal Achievement: 12/12/13 Potential to Achieve Goals: Good ADL Goals Pt Will Perform Grooming: with set-up;sitting Pt Will Perform Upper Body Bathing: with set-up;sitting Pt Will Transfer to Toilet: with min assist;stand pivot transfer;bedside commode Additional ADL Goal #1: Maintain sitting balance EOB x 10 min with S for ADL  Plan Discharge plan remains appropriate    Co-evaluation                 End of Session Equipment Utilized During Treatment: Gait belt   Activity Tolerance Patient tolerated treatment well   Patient Left in chair;with call bell/phone within reach;with chair alarm  set;with family/visitor present   Nurse Communication Other (comment);Mobility status (swelling around incision site)        Time: 9449-6759 OT Time Calculation (min): 37 min  Charges: OT General Charges $OT Visit: 1 Procedure OT Treatments $Self Care/Home Management : 23-37 mins  Roney Jaffe Ehsan Corvin 12/03/2013, 9:58 AM   Maurie Boettcher, OTR/L  [phone] 12/03/2013

## 2013-12-03 NOTE — Progress Notes (Signed)
Katie [name] is now over at [facility]. She was placed on to my service. She is improving. She apparently is going start radiation therapy.  She is eating better. We will still get an a swallowing study on her. The this is so that we can see if she can tolerate thin liquids. She apparently is able to eat regular food.  She is not hurting. She still has some speech issues. She is not having any problems with seizures. She is having some occasional headache.  Is no bleeding. There is no bowel or bladder incontinence. She has no nausea or vomiting.  She's had no cough. There is no obvious shortness of breath.  We have to repeat her scan so we can see how her underlying malignancy has progressed, if any.  On her physical exam blood pressure is 101/50. Temperature 98.2. Pulse 66. Lungs are clear. Oral exam shows some thrush. Cardiac exam regular rate rhythm. Abdomen is soft. She has good bowel sounds. There is no palpable liver or spleen tip. Back exam no tenderness over the spine. Head exam does show the surgical scar and sutures over on the right parietal region. Extremities shows good movement of her upper or lower extremities. No edema is noted. She has no obvious weakness. Skin exam no rashes.  She needs to be an inpatient for radiation. I just don't think she is stable enough for any outpatient radiation. She needs physical therapy to continue. She may be a candidate for inpatient rehabilitation.  Watch his inpatient, we will do scans. She needs a Port-A-Cath placed. This will be for chemotherapy.  We will continue to manage her. We will try to improve her functional status. Currently, her performance status is ECOG 2.  Pete E.  2 Cor: 12: 9-10

## 2013-12-04 ENCOUNTER — Ambulatory Visit
Admit: 2013-12-04 | Discharge: 2013-12-04 | Disposition: A | Discharge status: Home / Self Care | Payer: BC Managed Care – PPO | Attending: Radiation Oncology | Admitting: Radiation Oncology

## 2013-12-04 ENCOUNTER — Encounter (HOSPITAL_COMMUNITY): Payer: Self-pay | Admitting: Radiology

## 2013-12-04 ENCOUNTER — Inpatient Hospital Stay (HOSPITAL_COMMUNITY): Payer: BC Managed Care – PPO

## 2013-12-04 LAB — GLUCOSE, CAPILLARY
GLUCOSE-CAPILLARY: 141 mg/dL — AB (ref 70–99)
GLUCOSE-CAPILLARY: 153 mg/dL — AB (ref 70–99)
GLUCOSE-CAPILLARY: 155 mg/dL — AB (ref 70–99)
Glucose-Capillary: 111 mg/dL — ABNORMAL HIGH (ref 70–99)
Glucose-Capillary: 131 mg/dL — ABNORMAL HIGH (ref 70–99)
Glucose-Capillary: 133 mg/dL — ABNORMAL HIGH (ref 70–99)
Glucose-Capillary: 162 mg/dL — ABNORMAL HIGH (ref 70–99)
Glucose-Capillary: 164 mg/dL — ABNORMAL HIGH (ref 70–99)

## 2013-12-04 LAB — BASIC METABOLIC PANEL
BUN: 28 mg/dL — AB (ref 6–23)
CHLORIDE: 99 meq/L (ref 96–112)
CO2: 29 mEq/L (ref 19–32)
CREATININE: 0.42 mg/dL — AB (ref 0.50–1.10)
Calcium: 8.1 mg/dL — ABNORMAL LOW (ref 8.4–10.5)
GFR calc Af Amer: >90 mL/min (ref >90)
Glucose, Bld: 118 mg/dL — ABNORMAL HIGH (ref 70–99)
POTASSIUM: 4.5 meq/L (ref 3.7–5.3)
Sodium: 136 mEq/L — ABNORMAL LOW (ref 137–147)

## 2013-12-04 LAB — CBC
HCT: 31 % — ABNORMAL LOW (ref 36.0–46.0)
Hemoglobin: 10.5 g/dL — ABNORMAL LOW (ref 12.0–15.0)
MCH: 32.2 pg (ref 26.0–34.0)
MCHC: 33.9 g/dL (ref 30.0–36.0)
MCV: 95.1 fL (ref 78.0–100.0)
Platelets: 193 10*3/uL (ref 150–400)
RBC: 3.26 MIL/uL — ABNORMAL LOW (ref 3.87–5.11)
RDW: 14 % (ref 11.5–15.5)
WBC: 15.3 10*3/uL — AB (ref 4.0–10.5)

## 2013-12-04 LAB — LACTATE DEHYDROGENASE: LDH: 273 U/L — AB (ref 94–250)

## 2013-12-04 MED ORDER — ENOXAPARIN SODIUM 40 MG/0.4ML ~~LOC~~ SOLN: 40.0000 mg | SUBCUTANEOUS | Status: DC

## 2013-12-04 MED ORDER — IOHEXOL 300 MG/ML  SOLN
25.0000 mL | Freq: Once | INTRAMUSCULAR | Status: AC | PRN
  Administered 2013-12-04: 25 mL via ORAL

## 2013-12-04 MED ORDER — IOHEXOL 300 MG/ML  SOLN
100.0000 mL | Freq: Once | INTRAMUSCULAR | Status: AC | PRN
  Administered 2013-12-04: 100 mL via INTRAVENOUS

## 2013-12-04 MED ORDER — CEFAZOLIN SODIUM-DEXTROSE 2-3 GM-% IV SOLR
2.0000 g | INTRAVENOUS | Status: AC
  Administered 2013-12-05: 2 g via INTRAVENOUS
  Filled 2013-12-04: qty 50

## 2013-12-04 NOTE — Progress Notes (Signed)
Simulation Verification Note Inpatient Whole Brain RT, brain metastases  The patient was brought to the treatment unit and placed in the planned treatment position. The clinical setup was verified. Then port films were obtained and uploaded to the radiation oncology medical record software.  The treatment beams were carefully compared against the planned radiation fields. The position location and shape of the radiation fields was reviewed. They targeted volume of tissue appears to be appropriately covered by the radiation beams. Organs at risk appear to be excluded as planned.  Based on my personal review, I approved the simulation verification. The patient's treatment will proceed as planned.  -----------------------------------  Eppie Gibson, MD

## 2013-12-04 NOTE — Progress Notes (Signed)
Katie [name] started radiation yesterday. She did okay with this.  Speech pathology saw her. We still are avoiding clear and thin liquids.  Physical therapy saw her. [facility] I think this would  be appropriate for her if a bed opens up.  She needs to have her CT scan done. She needs to have a Port-A-Cath placed.  There may be some slightly more swelling on the right side of her scalp where she had her surgery. This is nontender this is not warm it is not red.  She does have some slight confusion but she is pleasant.  She has not had any cough or shortness of breath. There's no issues with bowels or bladder.  Her labs look good. LDH is 273. Hemoglobin 10.5.  It will be interesting to see what her CT scan shows. If we see significant progression, I may go ahead and start chemotherapy on her with the radiation.  On her physical exam, blood pressure 107/54. Temperature 98.2. Pulse 68. Lungs are clear. Oral exam shows no thrush now. Cardiac exam regular rate and rhythm. Abdomen is soft. Has good bowel sounds. Extremities shows decent movement of all extremities. No swelling is noted. Neurological exam shows the slight confusion. However she is pleasant.  She still needs to have the radiation therapy as an inpatient. She just is not medically ready for any outpatient therapy. It would be virtually impossible to get her back and forth to radiation as an outpatient. If she needs to go to come in inpatient rehabilitation this would be appropriate.  She is making progress. I really don't want to see her progress be compromised. We have a good opportunity to have her to achieve a good and functional quality of life and quantity of life. We need to make sure that we focus on this and continue to give her the care that she deserves.   Pete E.  Isaiah 41:10

## 2013-12-04 NOTE — Progress Notes (Signed)
Md Please order repeat MBS to determine readiness for dietary advancement to thin liquid when you feel indicated.  Thanks.   Luanna Salk, [place] [facility] SLP [phone]

## 2013-12-04 NOTE — Progress Notes (Signed)
NUTRITION FOLLOW UP  DOCUMENTATION CODES Per approved criteria  -Moderate (non-severe) malnutrition in the context of chronic illness   INTERVENTION: -Ensure Pudding po TID, each supplement provides 170 kcal and 4 grams of protein -MagicCup TID -Will continue to monitor   NUTRITION DIAGNOSIS: Inadequate oral intake related to inability to eat as evidenced by NPO status, improving   Goal: Pt to meet >/= 90% of their estimated nutrition needs, improving  Monitor:  Swallow profile, total protein/energy intake, weight, labs, I/O's  ASSESSMENT: 63 y.o. F who apparently had a syncopal episode 3/23 falling and hitting her head in the left frontal region. A CT scan of the brain demonstrated at least 2 large right-sided lesions one frontally and one parietal occipitally there is 12 mm of shift with subfalcine herniation. She underwent right parietal occipital craniotomy and resection of brain tumor 3/24. PCCM consulted post op for vent management.  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  Per RN PO intake variable. Spoke to pt who never opened her eyes, pt unable to provide answers to questions.   4/15: -Pt on Dys3 diet/nectar thick -Pt was out of room for CT scan during time of follow up. Information was obtained by pt's family and RN -Has had excellent appetite, eating 100% of meals. Family has been monitoring and providing swallowing cues to assist pt while she feeds herself -RN reported that pt enjoys both Ensure Pudding TID and MagicCup TID -Has tried to thicken Ensure Complete to nectar thick consistency; however was unable to effectively mix thickener. Will d/c Ensure Complete and continue with other supplements -SLP recommends pt undergo MBS as she may be alert enough to tolerate thin liquids.  Will monitor results and adjust supplements as warranted -MD noted pt's thrush improved. Underwent radiation on 4/14  Height: Ht Readings from Last 1 Encounters:  12/02/13 5\' 6"  (1.676 m)    Weight: Wt Readings from Last 1 Encounters:  12/03/13 119 lb 14.9 oz (54.4 kg)  Admit wt 118 lb  BMI:  Body mass index is 19.37 kg/(m^2).   Estimated Nutritional Needs: Kcal: 1650-1850 Protein: 90-110 grams Fluid: 1.6-1.8 L  Skin: closed incision on head, abrasion on left elbow, ecchymosis on left hip  Diet Order: Dysphagia 3 with nectar thickened liquids   Intake/Output Summary (Last 24 hours) at 12/04/13 1206 Last data filed at 12/04/13 0948  Gross per 24 hour  Intake    720 ml  Output    300 ml  Net    420 ml   BM: 4/8 x 3  Labs:   Recent Labs Lab 12/02/13 0415 12/03/13 0355 12/04/13 0335  NA 137 134* 136*  K 4.7 4.5 4.5  CL 98 97 99  CO2 26 29 29   BUN 34* 34* 28*  CREATININE 0.49* 0.54 0.42*  CALCIUM 8.2* 8.1* 8.1*  GLUCOSE 126* 129* 118*    CBG (last 3)   Recent Labs  12/04/13 0421 12/04/13 0710 12/04/13 1128  GLUCAP 111* 162* 133*    Scheduled Meds: . antiseptic oral rinse  15 mL Mouth Rinse QID  . atorvastatin  10 mg Oral QPM  . cyanocobalamin  500 mcg Oral Daily  . dexamethasone  6 mg Oral 3 times per day  . enoxaparin (LOVENOX) injection  40 mg Subcutaneous Q24H  . escitalopram  10 mg Oral Daily  .  feeding supplement (ENSURE)  1 Container Oral TID BM  . fluconazole  100 mg Oral Daily  . insulin aspart  0-15 Units Subcutaneous 6 times per day  . levETIRAcetam  500 mg Oral BID  . multivitamin with minerals  1 tablet Oral Daily  . pantoprazole  40 mg Oral BID    Continuous Infusions: . sodium chloride 10 mL/hr at 11/30/13 [place] LDN Clinical Dietitian [phone]

## 2013-12-04 NOTE — Progress Notes (Signed)
Physical Therapy Treatment Patient Details Name: Katie [name] MRN: [id number] DOB: 07/14/1951 Today's Date: 12/04/2013    History of Present Illness 64 y.o. F s/p R parietal occipital craniotomy and resection of brain tumor 3/24.     PT Comments    Pt had Radiation this am so waiting till pm for PT.  Assisted OOB to amb to BR.  Pt voided.  Assisted with hygiene.  Amb in hallway using EVA walker + 3 assist (3rd to follow with recliner).  Tolerated session well. Following directions which are at times repeated as pt is easily distracted.  Requires redirection. HIGH FALL RISK with poor safety cognition and impulsive. Positioned in recliner with multiple pillows and chair alarm set.  Spouse and sister in room.     Follow Up Recommendations  CIR     Equipment Recommendations       Recommendations for Other Services       Precautions / Restrictions Precautions Precautions: Fall Precaution Comments: impulsive, poor safety cognition Restrictions Weight Bearing Restrictions: No    Mobility  Bed Mobility Overal bed mobility: Needs Assistance Bed Mobility: Supine to Sit     Supine to sit: Min assist;Mod assist     General bed mobility comments: impulsive.  Poor gross motor control.  Increased assist to complete scooting to EOB.  Close supervision when sitting EOB.  HIGH FALL RISK  Transfers Overall transfer level: Needs assistance Equipment used: 2 person hand held assist (EVA walker) Transfers: Sit to/from Stand Sit to Stand: +2 physical assistance;+2 safety/equipment;Max assist         General transfer comment: impulsive.  MAX VC's on hand placement with sit to stand and stand to sit.  Impaired gross motor control.  Assisted off bed and on/off commode.   Ambulation/Gait Ambulation/Gait assistance: +2 safety/equipment;+2 physical assistance;Total assist;Max assist ( + 3 such that chair is to follow) Ambulation Distance (Feet): 48 Feet Assistive device:  (EVA walker) Gait  Pattern/deviations: Step-to pattern;Step-through pattern;Ataxic;Narrow base of support Gait velocity: decreased   General Gait Details: Used EVA walker for increased support plus required mmmmmax assist to stabilize hips and tactile cueing to assist with weight shift and progress forward gait.  Severe posterior push and difficulty with steppage. Pt c/o R calf pain (described as stretch/tightness)   Spouse assisted by walking in front to encourage increased distance and sister assisted by following with recliner.    Stairs            Wheelchair Mobility    Modified Rankin (Stroke Patients Only)       Balance                                    Cognition                            Exercises      General Comments        Pertinent Vitals/Pain C/o R calf "tightness" during gait.    Home Living                      Prior Function            PT Goals (current goals can now be found in the care plan section) Progress towards PT goals: Progressing toward goals    Frequency  Min 4X/week    PT Plan  Co-evaluation             End of Session Equipment Utilized During Treatment: Gait belt Activity Tolerance: Patient tolerated treatment well Patient left: in chair;with call bell/phone within reach;with family/visitor present;with chair alarm set     Time: 6015-6153 PT Time Calculation (min): 31 min  Charges:  $Gait Training: 8-22 mins $Therapeutic Activity: 8-22 mins                    G Codes:      Rica Koyanagi  PTA [facility]  Acute  Rehab Pager      [phone]

## 2013-12-04 NOTE — Progress Notes (Signed)
Patient ID: Katie [name], female   DOB: 11-07-1950, 63 y.o.   MRN: [id number] Request received for port a cath placement in pt with history of metastatic small cell lung carcinoma, initially diagnosed in 10/2013 following resection for right parietal  brain tumor. She was evaluated by Dr. Marin Olp and chemo recommended for likely micrometastatic disease. She developed CSF leak requiring revision of incision with subgleal drain on 11/22/13. Mentation has fluctuated and she was placed on Vancomycin for HCAP. Drain removed but patient with continued subgaleal fluid collections therefore LP done with resolution. She was transferred back to [facility] for initiation of whole brain  XRT. Additional PMH as below. Exam: pt awake but sl confused; rt scalp with swelling from prior craniectomy but no erythema or drainage ; chest- CTA bilat; heart- RRR; abd- soft,+BS,NT; ext- FROM, no edema.    Filed Vitals:   12/03/13 0500 12/03/13 1342 12/03/13 2030 12/04/13 0521  BP:  107/51 104/59 107/54  Pulse:  70 67 68  Temp:  99 F (37.2 C) 97.7 F (36.5 C) 98.2 F (36.8 C)  TempSrc:  Oral Oral Oral  Resp:  16 16 18   Height:      Weight: 119 lb 14.9 oz (54.4 kg)     SpO2:  99% 99% 100%   Past Medical History  Diagnosis Date  . Anxiety     takes lexapro   Past Surgical History  Procedure Laterality Date  . Appendectomy    . Colon surgery      polyps  removed  . Craniotomy Right 11/12/2013    Procedure: RIGHT PARIETAL CRANIOTOMY;  Surgeon: Kristeen Miss, MD;  Location: [place] [facility];  Service: Neurosurgery;  Laterality: Right;  . Craniotomy Right 11/22/2013    Procedure: Revision of Right Parietal occipital wound due to CSF Leak;  Surgeon: Kristeen Miss, MD;  Location: [place] [facility];  Service: Neurosurgery;  Laterality: Right;  Revision of Right Parietal occipital wound due to CSF Leak   Dg Abd 1 View  11/18/2013   CLINICAL DATA:  NG tube replaced for feeding.  EXAM: ABDOMEN - 1 VIEW  COMPARISON:  KUB from yesterday  FINDINGS:  There is a nasogastric tube with tip terminating in the proximal stomach. The side port is at the GE junction.  There is motion artifact, decreasing sensitivity. Contrast remains present throughout the colon, without evidence of obstruction. No gross opacification of the lung bases.  IMPRESSION: Nasogastric tube enters the proximal stomach, with side port at the GE junction. Advancement by 5 cm would provide more secure positioning.   Electronically Signed   By: Jorje Guild M.D.   On: 11/18/2013 05:03   Ct Head Wo Contrast  11/28/2013   CLINICAL DATA:  Neuro status change.  EXAM: CT HEAD WITHOUT CONTRAST  TECHNIQUE: Contiguous axial images were obtained from the base of the skull through the vertex without intravenous contrast.  COMPARISON:  11/23/2013  FINDINGS: Status post removal of a subgaleal drain which was present over a recent right parietal craniotomy. There is accumulation of sub galeal fluid around the bone flap, up to 9 mm in thickness. Gas in the subgaleal space and extra-axial spaces is again present. There is trace extra-axial fluid at the level of the bone flap, measuring no more than 2 mm. The dura or associated membrane is again seen as a thin high-density line. No postoperative hematoma.  More well-defined low attenuation in the temporoparietal region, the site of recently resected mass. No evidence of acute hemorrhage. No increasing  local mass effect. High-density mass in the right frontal lobe, measuring up to 24 mm, is unchanged in appearance, including small satellite nodule located more ventrally and anteriorly. Associated vasogenic edema is not increased.  These changes result in similar mass effect, causing left ward midline shift of 6-7 mm. When accounting for differences in scan angle, of the shift is stable from prior. No overt hydrocephalus. Similar lateral ventricular volume. No evidence of acute hemorrhage, cortical infarct, or herniation.  IMPRESSION: 1. Subgaleal fluid  accumulation after drain removal, suggesting ongoing CSF leak. Minimal epidural fluid accumulation around the bone flap. 2. Progressive decreased density in the right parietal resection bed which may reflect clearing blood products or evolving infarct. 3. 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 4. Given differences in scan angle, unchanged leftward shift - up to 7 mm.   Electronically Signed   By: Jorje Guild M.D.   On: 11/28/2013 00:01   Ct Head Wo Contrast  11/23/2013   CLINICAL DATA:  Altered mental status. Status post resection of brain tumors.  EXAM: CT HEAD WITHOUT CONTRAST  TECHNIQUE: Contiguous axial images were obtained from the base of the skull through the vertex without intravenous contrast.  COMPARISON:  Multiple prior CTs of the head, the most recent of which was performed 11/20/2013  FINDINGS: Suspected trace blood at the site of right temporoparietal mass resection is somewhat less apparent. A few small foci of increased attenuation at the operative site are nonspecific; trace residual mass cannot be excluded. Surrounding vasogenic edema is again seen. The 2.3 x 2.0 cm mass at the right frontal lobe is essentially unchanged, with surrounding vasogenic edema. A tiny focus of increased attenuation more inferiorly may reflect slight nodular extension of the mass. There is mild mass effect due to vasogenic edema, with perhaps 4 mm of leftward midline shift, improved from prior studies.  No definite intraventricular blood is seen. No significant subdural or subarachnoid hemorrhage is identified. Trace residual pneumocephalus is seen at the surgical site.  The posterior fossa, including the cerebellum, brainstem and fourth ventricle, is within normal limits.  A right temporoparietal craniotomy flap is again noted, with overlying skin staples and drainage catheter. The visualized portions of the orbits are within normal limits. The paranasal sinuses and mastoid air  cells are well-aerated. No significant soft tissue abnormalities are seen.  IMPRESSION: 1. Suspected trace blood at the site of right temporoparietal mass resection is somewhat less apparent. Few small foci of increased attenuation at the operative site are nonspecific; trace residual mass cannot be excluded. Surrounding vasogenic edema again seen. 2. 2.3 x 2.0 cm mass at the right frontal lobe is essentially unchanged, with surrounding vasogenic edema. Mild mass effect noted, with perhaps 4 mm of leftward midline shift, improved from prior studies. 3. Trace residual pneumocephalus noted at the surgical site.   Electronically Signed   By: Garald Balding M.D.   On: 11/23/2013 03:26   Ct Head Wo Contrast  11/20/2013   CLINICAL DATA:  Persistent lethargy status post craniotomy for resection of lung cancer brain metastasis.  EXAM: CT HEAD WITHOUT CONTRAST  TECHNIQUE: Contiguous axial images were obtained from the base of the skull through the vertex without intravenous contrast.  COMPARISON:  11/13/2013  FINDINGS: Sequelae of right parietal craniotomy for tumor resection are again identified. Pneumocephalus has resolved. There is trace extra-axial fluid at the craniotomy site. Small amount of blood products at the resection site have decreased in conspicuity. Vasogenic edema in  this region it has also decreased. There is decreased effacement of the right lateral ventricle and decreased leftward midline shift, now measuring 7 mm (previously 11 mm). Trace blood is present in the occipital horn of the left lateral ventricle. There is no ventricular dilatation. 2.2 x 2.0 cm right frontal mass is unchanged, as is an adjacent 5 mm density anteroinferior to the mass which could reflect a small focus of hemorrhage as suggested on the prior CT or instead may represent the inferior most portion of the mass. Vasogenic edema surrounding the right frontal lobe mass is similar to the prior study.  Hypoattenuation involving cortex  and white matter in the medial left parietal lobe is again seen although this is slightly less prominent than on the prior CT. Hypoattenuation in the region of the left hippocampal tail similar to the prior study. The visualized paranasal sinuses and mastoid air cells are clear. Orbits are unremarkable.  IMPRESSION: 1. Evolving postoperative changes in the right temporoparietal region with decreased edema and midline shift. 2. Unchanged appearance of right frontal mass and surrounding edema. 3. Trace blood in the occipital horn of the left lateral ventricle. 4. Stable to slightly decreased conspicuity of medial left parietal hypoattenuation, which could reflect ischemia.   Electronically Signed   By: Logan Bores   On: 11/20/2013 08:33   Ct Head Wo Contrast  11/13/2013   CLINICAL DATA:  Follow-up brain tumor resection.  EXAM: CT HEAD WITHOUT CONTRAST  TECHNIQUE: Contiguous axial images were obtained from the base of the skull through the vertex without intravenous contrast.  COMPARISON:  MR HEAD WO/W CM dated 11/11/2013; CT HEAD W/O CM dated 11/11/2013  FINDINGS: Status post interval right frontal parietal craniotomy for debulking/ resection of right temporoparietal mass. Small amount of hyperdense blood products and air within the resection cavity. Small amount of extra-axial pneumocephalus mass seen.  No apparent change in mildly hyperdense right frontal lobe 22 x 19 mm mass, with a subcentimeter focus along the inferior margin of hyperdense possible blood products, axial 15/33. Right cerebral probable vasogenic edema, corresponding to MRI abnormality. 10 mm right to left midline shift, previously 12 mm. The right lateral ventricle remains predominantly the face, however the left ventricle system is decompressed, no entrapment on today's examination. Patchy hypodensity within the left mesial parietal lobe, axial 20/ 33 and evolving the gray and white matter.  Slight re-expansion of supercerebellar cistern, with  persistent effacement of the quadrigeminal/ambient cistern. Very mild right uncal herniation is similar.  Mild calcific atherosclerosis of the carotid siphons. Visualized paranasal sinuses remain well aerated. Included ocular globes and orbital contents are nonsuspicious.  IMPRESSION: Status post interval right frontal parietal craniotomy for debulking/ resection of known right temporal parietal mass, with expected postoperative changes including minimal air and blood within the resection cavity. Small amount of extra-axial pneumocephalus. Overall less mass effect with residual 10 mm right to left midline shift (improved), without hydrocephalus or ventricular entrapment on today's study.  Focal loss of the gray-white matter junction involving the mesial left parietal lobe concerning for acute ischemia.  Stable appearance of 22 x 19 mm right frontal lobe mass, however there is a subcentimeter focus of apparent blood along the inferior margin of the mass which appears new.   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 Sagittal and coronal MPR images reconstructed from axial data set.  CONTRAST:  165mL OMNIPAQUE IOHEXOL 300 MG/ML  SOLN  COMPARISON:  None  FINDINGS: CT CHEST FINDINGS  Tip of endotracheal tube 3.3 cm above carinal.  Nasogastric tube extends into stomach.  Scattered aortic and coronary arterial calcifications.  Four low-attenuation likely necrotic enlarged subcarinal of lymph nodes identified, measuring 2.5 cm, 2.6 cm, 2.2 cm and 2.2 cm in short axes.  No additional  thoracic adenopathy.  Scattered subcutaneous and soft tissue edema.  Vascular structures grossly patent on nondedicated exam.  Lobulated left lower lobe mass 2.0 x 1.7 cm image 42, approximately 3 cm length.  Mild atelectasis in medial left lower lobe.  Remaining lungs clear.  No pleural effusion, pneumothorax, or additional mass/nodule.  No acute osseous findings.  CT ABDOMEN AND PELVIS FINDINGS  Liver, spleen, pancreas, kidneys, and adrenal glands normal appearance.  Distended gallbladder, otherwise unremarkable.  Stomach decompressed with suboptimal assessment of gastric wall thickness.  Normal appendix.  Air and Foley catheter within urinary bladder.  Small amount of nonspecific presacral soft tissue edema.  Bowel loops otherwise unremarkable.  Few normal-sized retroperitoneal lymph nodes seen.  Scattered atherosclerotic calcification.  No mass, adenopathy, or ascites.  Unremarkable uterus and adnexae.  No acute osseous findings.  IMPRESSION: Lobulated left lower lobe mass 2.0 x 1.7 cm, approximately 3 cm length, consistent with tumor.  Subcarinal adenopathy, low attenuation question necrotic, largest node 2.6 cm short axis.  No intra-abdominal or intrapelvic metastatic disease identified.  Subsegmental atelectasis left lower lobe.   Electronically Signed   By: Lavonia Dana M.D.   On: 11/14/2013 17:00   Mr Jeri Cos LF Contrast  11/12/2013   CLINICAL DATA:  Syncopal episode, intracranial masses.  EXAM: MRI HEAD WITHOUT AND WITH CONTRAST  TECHNIQUE: Multiplanar, multiecho pulse sequences of the brain and surrounding structures were obtained without and with intravenous contrast for surgical planning.  CONTRAST:  63mL MULTIHANCE GADOBENATE DIMEGLUMINE 529 MG/ML IV SOLN  COMPARISON:  CT HEAD W/O CM dated 11/11/2013; CT HEAD W/O CM dated 12/15/2009  FINDINGS: Moderately motion degraded examination, per technologist patient was unable to remain still for the examination.  Heterogeneous may enhancing right temporal  parietal occipital at least 4.4 x 6 cm (transverse by AP) mass with surrounding T2 FLAIR hyperintense vasogenic edema and possibly nonenhancing tumor. T2 bright signal along the temporal stem extending into the internal capsule, external capsule on the right. Effacement of the right temporal horn, occipital horn with left lateral ventricle/atrial enlargement concerning for entrapment with FLAIR hyperintense probable transependymal flow cerebral spinal fluid. 10 mm of right-to-left midline shift.  Similar characteristic 2.3 x 2.3 cm mass in high right frontal lobe, the gray-white matter junction. Extensor surrounding FLAIR hyperintense signal.  Additional scattered subcentimeter supratentorial white matter lesions, with no definite enhancement. No extra-axial masses no abnormal leptomeningeal enhancement. Effacement of the right quadrigeminal/ambient cistern deforming the mid brain. Mildly effaced suprasellar cistern.  IMPRESSION: Moderately motion degraded limited MRI of the brain for surgical planning.  Heterogeneously enhancing 4.4 x 6 cm right temporal parietal occipital mass with extensive surrounding presumed vasogenic edema, though a component infiltrated nonenhancing tumor not excluded. In addition, similar appearing high right frontal lobe 2.3 x 2.3. Constellation of findings likely reflect metastatic disease, less likely lymphoma or  possibly primary brain tumors.  10 mm of right-to-left midline shift with left ventricle entrapment/hydrocephalus. Effaced basal cisterns.  Additional subcentimeter white matter lesions favor chronic small vessel ischemic disease, incompletely characterized on this motion degraded limited MRI.   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 Sagittal and coronal MPR images reconstructed from axial data set.  CONTRAST:  143mL OMNIPAQUE IOHEXOL 300 MG/ML  SOLN  COMPARISON:  None  FINDINGS: CT CHEST FINDINGS  Tip of endotracheal tube 3.3 cm above carinal.  Nasogastric tube extends into stomach.  Scattered aortic and coronary arterial calcifications.  Four low-attenuation likely necrotic enlarged subcarinal of lymph nodes identified, measuring 2.5 cm, 2.6 cm, 2.2 cm and 2.2 cm in short axes.  No additional thoracic adenopathy.  Scattered subcutaneous and soft tissue edema.  Vascular structures grossly patent on nondedicated exam.  Lobulated left lower lobe mass 2.0 x 1.7 cm image 42, approximately 3 cm length.  Mild atelectasis in medial left lower lobe.  Remaining lungs clear.  No pleural effusion, pneumothorax, or additional mass/nodule.  No acute osseous findings.  CT ABDOMEN AND PELVIS FINDINGS  Liver, spleen, pancreas, kidneys, and adrenal glands normal appearance.  Distended gallbladder, otherwise unremarkable.  Stomach decompressed with suboptimal assessment of gastric wall thickness.  Normal appendix.  Air and Foley catheter within urinary bladder.  Small amount of nonspecific presacral soft tissue edema.  Bowel loops otherwise unremarkable.  Few normal-sized retroperitoneal lymph nodes seen.  Scattered atherosclerotic calcification.  No mass, adenopathy, or ascites.  Unremarkable uterus and adnexae.  No acute osseous findings.  IMPRESSION: Lobulated left lower lobe mass 2.0 x 1.7 cm, approximately 3 cm length, consistent with tumor.  Subcarinal adenopathy, low attenuation question necrotic, largest node 2.6 cm short axis.  No intra-abdominal or intrapelvic metastatic disease identified.  Subsegmental atelectasis left lower lobe.    Electronically Signed   By: Lavonia Dana M.D.   On: 11/14/2013 17:00   Dg Chest Port 1 View  11/19/2013   CLINICAL DATA:  Weakness, hypoxia, and shortness of breath, history of tobacco use  EXAM: PORTABLE CHEST - 1 VIEW  COMPARISON:  DG CHEST 1V PORT dated 11/16/2013  FINDINGS: The lungs remain hyperinflated. There is no focal infiltrate. The interstitial markings are mildly prominent diffusely but not greatly changed. The cardiac silhouette is normal in size. The pulmonary vascularity is not engorged. The mediastinum is normal in width. The proximal port of the esophagogastric tube is at the GE junction and advancement by 5-10 cm is recommended.  IMPRESSION: There is hyperinflation consistent with COPD. Mild prominence of the interstitial markings may reflect minimal interstitial edema or interstitial pneumonia. There is no alveolar pneumonia nor pleural effusion.   Electronically Signed   By: David  Martinique   On: 11/19/2013 09:09   Dg Chest Port 1 View  11/16/2013   CLINICAL DATA:  Evaluate left lung base atelectasis.  EXAM: PORTABLE CHEST - 1 VIEW  COMPARISON:  DG CHEST 1V PORT dated 11/15/2013  FINDINGS: The densities at the left lung base have nearly resolved. No focal lung densities or airspace disease. Nasogastric tube extends  into the upper abdomen. There is contrast within the bowel just underneath the left hemidiaphragm. Heart size is stable.  IMPRESSION: The left basilar lung densities have nearly resolved.   Electronically Signed   By: Markus Daft M.D.   On: 11/16/2013 08:38   Dg Chest Port 1 View  11/15/2013   CLINICAL DATA:  Intubation.  EXAM: PORTABLE CHEST - 1 VIEW  COMPARISON:  CT CHEST W/CM dated 11/14/2013; DG CHEST 1V PORT dated 11/14/2013  FINDINGS: Endotracheal tube and NG tube in stable position. Left lower lobe infiltrate noted consistent with pneumonia. No pleural effusion or pneumothorax. Heart size and pulmonary vascularity normal.  IMPRESSION: 1. Endotracheal tube and NG tube in good  anatomic position. 2. Left lower lobe infiltrate consistent with pneumonia.   Electronically Signed   By: Marcello Moores  Register   On: 11/15/2013 08:04   Dg Chest Port 1 View  11/14/2013   CLINICAL DATA:  Endotracheal tube evaluation  EXAM: PORTABLE CHEST - 1 VIEW  COMPARISON:  DG CHEST 1V PORT dated 11/13/2013  FINDINGS: Hyperaeration. Tubular device is stable. Minimal patchy density at the left base stable. Right lung is clear. No pneumothorax.  IMPRESSION: Stable minimal patchy density at the left base.   Electronically Signed   By: Maryclare Bean M.D.   On: 11/14/2013 07:52   Dg Chest Port 1 View  11/13/2013   CLINICAL DATA:  Evaluate endotracheal tube position.  EXAM: PORTABLE CHEST - 1 VIEW  COMPARISON:  DG CHEST 1V PORT dated 11/12/2013; DG CHEST 1V PORT dated 11/11/2013; DG CHEST 2V dated 12/15/2009  FINDINGS: Endotracheal tube is roughly 3.1 cm above the carina. Slightly increased densities in the retrocardiac space probably represent atelectasis or vascular crowding. Again noted is underlying hyperinflation. No evidence for a pneumothorax. Stable appearance of the heart and mediastinum. Heart size is normal. Nasogastric tube extends into the abdomen.  IMPRESSION: Endotracheal tube is appropriately positioned.  Questionable retrocardiac density could represent focal atelectasis. Recommend attention on follow-up imaging.   Electronically Signed   By: Markus Daft M.D.   On: 11/13/2013 08:09   Dg Chest Port 1 View  11/12/2013   CLINICAL DATA:  Metastatic brain tumor.  Craniotomy yesterday.  EXAM: PORTABLE CHEST - 1 VIEW  COMPARISON:  11/11/2013  FINDINGS: Endotracheal tube and the NG tube appear in good position.  Lungs appear clear.  No effusions.  No acute osseous abnormality.  IMPRESSION: Clear lungs.   Electronically Signed   By: Rozetta Nunnery M.D.   On: 11/12/2013 10:22   Dg Abd Portable 1v  11/17/2013   CLINICAL DATA:  NG tube placement.  EXAM: PORTABLE ABDOMEN - 1 VIEW  COMPARISON:  11/12/2013  FINDINGS: NG  tube remains in the mid portion of the stomach. Gas and contrast material noted throughout the colon. No evidence of small bowel distention/obstruction. No free air. No organomegaly. No acute bony abnormality.  IMPRESSION: No evidence of bowel obstruction.  NG tube tip in the mid portion of the stomach.   Electronically Signed   By: Rolm Baptise M.D.   On: 11/17/2013 08:26   Dg Abd Portable 1v  11/12/2013   CLINICAL DATA:  Nasogastric tube placement.  EXAM: PORTABLE ABDOMEN - 1 VIEW  COMPARISON:  No priors.  FINDINGS: Single view of the abdomen demonstrates a nasogastric tube with tip in the body of the stomach and side port near the gastroesophageal junction. Visualized bowel gas pattern is nonobstructive.  IMPRESSION: 1. Tip of nasogastric tube is in the  body of the stomach.   Electronically Signed   By: Vinnie Langton M.D.   On: 11/12/2013 05:05                                                                                                                                                                                                                                                                                                                                                                                                                                                            Labs 12/04/13: WBC 15.3  HGB 10.5  PLTS 193K      K 4.5  CREAT 0.42   PT/PTT pending  A/P: Pt with history of metastatic small cell lung carcinoma, initially diagnosed in 10/2013 following resection for right parietal  brain tumor. She was evaluated by Dr. Marin Olp and chemo recommended for likely micrometastatic disease. She developed CSF leak requiring revision of incision with subgleal drain on 11/22/13. Mentation has fluctuated and she was placed on Vancomycin for HCAP. Drain removed but patient with continued subgaleal fluid collections therefore LP done with resolution. She was transferred back to [facility] for initiation of whole  brain  XRT. Tent plan is for placement of a port a cath on 4/16 for planned chemotherapy. Details/risks of procedure d/w pt's husband with his understanding and  consent.

## 2013-12-05 ENCOUNTER — Inpatient Hospital Stay (HOSPITAL_COMMUNITY): Payer: BC Managed Care – PPO

## 2013-12-05 ENCOUNTER — Ambulatory Visit
Admit: 2013-12-05 | Discharge: 2013-12-05 | Disposition: A | Discharge status: Home / Self Care | Payer: BC Managed Care – PPO | Attending: Radiation Oncology | Admitting: Radiation Oncology

## 2013-12-05 DIAGNOSIS — Z923 Personal history of irradiation: Secondary | ICD-10-CM

## 2013-12-05 LAB — GLUCOSE, CAPILLARY
GLUCOSE-CAPILLARY: 121 mg/dL — AB (ref 70–99)
GLUCOSE-CAPILLARY: 103 mg/dL — AB (ref 70–99)
Glucose-Capillary: 106 mg/dL — ABNORMAL HIGH (ref 70–99)
Glucose-Capillary: 114 mg/dL — ABNORMAL HIGH (ref 70–99)
Glucose-Capillary: 164 mg/dL — ABNORMAL HIGH (ref 70–99)

## 2013-12-05 LAB — PROTIME-INR
INR: 0.99 (ref 0.00–1.49)
Prothrombin Time: 12.9 seconds (ref 11.6–15.2)

## 2013-12-05 LAB — CBC
HCT: 31.2 % — ABNORMAL LOW (ref 36.0–46.0)
HEMOGLOBIN: 10.6 g/dL — AB (ref 12.0–15.0)
MCH: 32.4 pg (ref 26.0–34.0)
MCHC: 34 g/dL (ref 30.0–36.0)
MCV: 95.4 fL (ref 78.0–100.0)
Platelets: 186 10*3/uL (ref 150–400)
RBC: 3.27 MIL/uL — AB (ref 3.87–5.11)
RDW: 14.3 % (ref 11.5–15.5)
WBC: 13.5 10*3/uL — ABNORMAL HIGH (ref 4.0–10.5)

## 2013-12-05 LAB — BASIC METABOLIC PANEL
BUN: 28 mg/dL — ABNORMAL HIGH (ref 6–23)
CALCIUM: 8.2 mg/dL — AB (ref 8.4–10.5)
CO2: 32 meq/L (ref 19–32)
Chloride: 96 mEq/L (ref 96–112)
Creatinine, Ser: 0.46 mg/dL — ABNORMAL LOW (ref 0.50–1.10)
GFR calc Af Amer: >90 mL/min (ref >90)
GFR calc non Af Amer: >90 mL/min (ref >90)
Glucose, Bld: 120 mg/dL — ABNORMAL HIGH (ref 70–99)
POTASSIUM: 4.6 meq/L (ref 3.7–5.3)
SODIUM: 135 meq/L — AB (ref 137–147)

## 2013-12-05 LAB — SURGICAL PCR SCREEN
MRSA, PCR: NEGATIVE
Staphylococcus aureus: NEGATIVE

## 2013-12-05 LAB — LACTATE DEHYDROGENASE: LDH: 301 U/L — ABNORMAL HIGH (ref 94–250)

## 2013-12-05 LAB — APTT: APTT: 24 s (ref 24–37)

## 2013-12-05 MED ORDER — CEFAZOLIN SODIUM-DEXTROSE 2-3 GM-% IV SOLR
INTRAVENOUS | Status: AC
  Administered 2013-12-05: 2 g via INTRAVENOUS
  Filled 2013-12-05: qty 50

## 2013-12-05 MED ORDER — FENTANYL CITRATE 0.05 MG/ML IJ SOLN
INTRAMUSCULAR | Status: AC | PRN
  Administered 2013-12-05: 25 ug via INTRAVENOUS

## 2013-12-05 MED ORDER — HEPARIN SOD (PORK) LOCK FLUSH 100 UNIT/ML IV SOLN
INTRAVENOUS | Status: AC
  Filled 2013-12-05: qty 5

## 2013-12-05 MED ORDER — LIDOCAINE HCL 1 % IJ SOLN
INTRAMUSCULAR | Status: AC
  Filled 2013-12-05: qty 20

## 2013-12-05 MED ORDER — PANTOPRAZOLE SODIUM 40 MG PO TBEC
40.0000 mg | DELAYED_RELEASE_TABLET | Freq: Every day | ORAL | Status: DC
  Administered 2013-12-05 – 2013-12-06 (×2): 40 mg via ORAL
  Filled 2013-12-05: qty 1

## 2013-12-05 MED ORDER — ENOXAPARIN SODIUM 40 MG/0.4ML ~~LOC~~ SOLN
40.0000 mg | SUBCUTANEOUS | Status: DC
  Administered 2013-12-05: 40 mg via SUBCUTANEOUS
  Filled 2013-12-05 (×2): qty 0.4

## 2013-12-05 MED ORDER — MIDAZOLAM HCL 2 MG/2ML IJ SOLN
INTRAMUSCULAR | Status: AC | PRN
  Administered 2013-12-05: 1 mg via INTRAVENOUS

## 2013-12-05 MED ORDER — LIDOCAINE-EPINEPHRINE (PF) 2 %-1:200000 IJ SOLN
INTRAMUSCULAR | Status: AC
  Filled 2013-12-05: qty 20

## 2013-12-05 MED ORDER — MIDAZOLAM HCL 2 MG/2ML IJ SOLN
INTRAMUSCULAR | Status: AC
  Filled 2013-12-05: qty 6

## 2013-12-05 MED ORDER — FENTANYL CITRATE 0.05 MG/ML IJ SOLN
INTRAMUSCULAR | Status: AC
  Filled 2013-12-05: qty 6

## 2013-12-05 NOTE — Progress Notes (Signed)
Mrs. Hargrove did well with radiation yesterday.  She had a CT scan done yesterday. Thank you, we do not see any evidence of tumor progression in her body. This is reassuring to me.  She goes for a Port-A-Cath placement today.  She's doing some physical therapy. She walked down the hall with assistance.  I will like to hope that she will go to get to inpatient rehabilitation when a bed opens up. If so, we will be able to move her over there. She then had radiation therapy while over at inpatient rehabilitation.  Her appetite remains good. She's having no nausea or vomiting. Breasts going to the bathroom.  She's not complaining of any headache. She still has some confusion but is very pleasant.  Her labs look good. Renal function looks fine. Hemoglobin 10.6. Blood sugar stable at 120.Marland Kitchen LDH 301.  Her vital signs are stable. Blood pressure 108/67. Temperature 97.6. On her head exam, she still has some slight swelling in the right parietal region. This is where she had the craniotomy. This does not appear to be worse. There is no tenderness. No erythema. Oral exam is negative. Lungs are clear. Cardiac exam regular rate rhythm. Abdomen soft. Has good bowel sounds. There is no fluid wave. Extremities shows no clubbing cyanosis or edema. Has good strength in her arms and legs. Skin exam no rashes. Neurological exam shows no focal deficits.  She will continue radiation therapy. She will have her Port-A-Cath placed today. Hopefully, she will be a we get a bed over inpatient rehabilitation.  I do appreciate all the great care that she is getting on 3 E.!!  Jacob Moores 11:4

## 2013-12-05 NOTE — Progress Notes (Signed)
Occupational Therapy Treatment Patient Details Name: Katie [name] MRN: [id number] DOB: [birth year] Today's Date: 12/05/2013    History of present illness 63 y.o. F s/p R parietal occipital craniotomy and resection of brain tumor 3/24.    OT comments  Pt progressing with OT.  Able to tolerate OT after PT this afternoon.  Recommend CIR for rehab after acute hospitalization.    Follow Up Recommendations  CIR    Equipment Recommendations  3 in 1 bedside comode    Recommendations for Other Services      Precautions / Restrictions Precautions Precautions: Fall Precaution Comments: impulsive, poor safety cognition Restrictions Weight Bearing Restrictions: No       Mobility Bed Mobility            General  mobility comments: impulsive.Close supervision when sitting edge of chair.  HIGH FALL RISK  Transfers                Balance  Worked on unsupported sitting from edge of chair:  3 trials, 30 seconds to 2 minutes with min guard for safety                                 ADL       Grooming: Oral care;Brushing hair;Sitting (see general adl comments )                                 General ADL Comments: swabbed mouth for oral care: pt on thickened diet.  Pt was able to spit out and used squeezed sponge to rinse.  Pt was able to comb hair with guarding of back of head for protection.  Applied lotion with mod A.  worked intermittently on sitting up without support.  Pt able to hold this 1-2 minutes then got fatiqued.   slightly twisted body last attempt.  Pt was able to pull socks up by crossing legs with extra time to follow commands.  Pt had just gotten up to chair prior to OT.  Did not have toileting needs.  Pt was able to scoot herself forward in chair with min guard      Vision                     Perception     Praxis      Cognition   Behavior During Therapy: [facility] for tasks assessed/performed Overall Cognitive Status:  Impaired/Different from baseline Area of Impairment: Orientation;Attention;Memory;Following commands;Safety/judgement;Awareness;Problem solving                General Comments: pt able to read calendar on wall for orientation to time.  Pt remains impulsive    Extremity/Trunk Assessment               Exercises Other Exercises Other Exercises: pt was able to locate objects on bil sides.  At times, could not find visitor on L.  Pt intermittently closed eyes during session   Shoulder Instructions       General Comments      Pertinent Vitals/ Pain      No c/o pain  Home Living                                          Prior Functioning/Environment  Frequency Min 3X/week     Progress Toward Goals  OT Goals(current goals can now be found in the care plan section)  Progress towards OT goals: Progressing toward goals (added goals)  Acute Rehab OT Goals Time For Goal Achievement: 12/12/13 Potential to Achieve Goals: Good ADL Goals Additional ADL Goal #2: pt will maintain static standing for adls with RW with min A x 2 minutes  Plan Discharge plan remains appropriate    Co-evaluation                 End of Session     Activity Tolerance Patient tolerated treatment well   Patient Left in chair;with call bell/phone within reach;with chair alarm set;with family/visitor present   Nurse Communication          Time: 1660-6004 OT Time Calculation (min): 25 min  Charges: OT General Charges $OT Visit: 1 Procedure OT Treatments $Self Care/Home Management : 8-22 mins $Therapeutic Activity: 8-22 mins  [facility] 12/05/2013, 1:58 PM Lesle Chris, OTR/L [phone] 12/05/2013

## 2013-12-05 NOTE — Progress Notes (Signed)
SLP Cancellation Note  Patient Details Name: Katie [name] MRN: [id number] DOB: Jun 22, 1951   Cancelled treatment:       Reason Eval/Treat Not Completed:  (pt npo currently, ? for portacath, will continue to follow for dysphagia, cogling compensations.  thanks)   Luanna Salk, [facility] SLP [phone]

## 2013-12-05 NOTE — PMR Pre-admission (Signed)
PMR Admission Coordinator Pre-Admission Assessment  Patient: Katie [name] is an 63 y.o., female MRN: [id number] DOB: [birth year] Height: 5' 6"  (167.6 cm) Weight: 54.4 kg (119 lb 14.9 oz)              Insurance Information HMO:      PPO: Yes     PCP:       IPA:       80/20:       OTHER:  Group # [id number] PRIMARY: [employer] [place]      Policy#: [id number]      Subscriber: [place] Name: Delight Stare      Phone#: [phone]     Fax#: [phone] Pre-Cert#: [id number] X 2 weeks with update due on 12/18/13 after conference     Employer: Not employed Benefits:  Phone #: [phone]     Name: Ciara Eff. Date: 08/22/13     Deduct: $3000(met all)      Out of Pocket Max: $6600(met all)      Life Max: unlimited CIR: 70% /auth      SNF: 70% w/auth Outpatient: 70%  With 30 visit max     Co-Pay: 30% Home Health: 70% w/auth and 30 visit max      Co-Pay: 30% DME: 70%     Co-Pay: 30% Providers: in network   Emergency [place]   Name Relation Home Work Mobile   Doering,John Spouse [phone]  [phone]   Brown,Beverly Daughter [phone]  [phone]     Current Medical History  Patient Admitting Diagnosis: Right temporal occipital metastatic tumor (SCLC)    History of Present Illness: A 64 y.o. female with history of anxiety disorder otherwise in good health. She [facility] past syncopal event and sustained a fall with injury to left frontal region and obtunded at admission. CT brain done demonstrates at least 2 large right-sided lesions one frontally and one parietal occipital area with 2 mm of shift with subfalcine herniation. Husband reported 6 week history of confusion with disorientation and falls. MRI brain with 4.4 x 6 cm right temporal parietal occipital mass with extensive vasogenic edema and high right frontal lobe mass 2.3 X 2.3 cm likely metastatic disease or possibly primary brain tumors as well as 10 mm Right to left shift  with hydrocephalus. Patient taken to OR for Right parietal occipital crani for resection of tumor on 11/12/13 by Dr. Ellene Route. 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 CT lungs with LLL mass with subcarinal adenopathy. She was extubated without difficulty on 11/15/13. She was transferred to [facility] for Brain XRT as recommended by Dr. Isidore Moos. She was evaluated by Dr. Marin Olp and chemo recommended for likely micrometastatic disease. She developed CSF leak requiring revision of incision with subgleal drain on 11/22/13. Mentation has fluctuated and she was placed on Vancomycin for HCAP. Drain removed but patient with continued subgaleal fluid collections therefore LP done with resolution. She was transferred back to [facility] and and whole brain XRT to be initiated today.  Scheduled to have 14 radiation treatments.  Patient has had improvement in mentation but is easily distracted, verbose, impulsive with deficits in problem solving, judgement, awareness and memory. She is oriented to self only. Patient with poor posture, question diplopia as well as ataxic gait with poor motor control. MBS done with evidence of aspiration of thin liquids and patient on Dysphagia 3, nectar liquids. Therapy team recommending CIR for further therapies. Portacath placed 12/05/13  with plans for chemo after radiation therapy completed and patient stronger.   Past Medical History  Past Medical History  Diagnosis Date  . Anxiety     takes lexapro    Family History  family history is not on file.  Prior Rehab/Hospitalizations:  None   Current Medications  Current facility-administered medications:0.9 %  sodium chloride infusion, , Intravenous, Continuous, Juanito Doom, MD, Last Rate: 10 mL/hr at 11/30/13 0431;  acetaminophen (TYLENOL) solution 650 mg, 650 mg, Per Tube, Q6H PRN, Colbert Coyer, MD, 650 mg at 12/01/13 2148;  antiseptic oral rinse (BIOTENE) solution 15 mL, 15 mL, Mouth  Rinse, QID, Rush Farmer, MD, 15 mL at 12/05/13 1312 atorvastatin (LIPITOR) tablet 10 mg, 10 mg, Oral, QPM, Kristeen Miss, MD, 10 mg at 12/04/13 1630;  cyanocobalamin tablet 500 mcg, 500 mcg, Oral, Daily, Kristeen Miss, MD, 500 mcg at 12/05/13 1102;  dexamethasone (DECADRON) tablet 6 mg, 6 mg, Oral, 3 times per day, Volanda Napoleon, MD, 6 mg at 12/05/13 1346;  enoxaparin (LOVENOX) injection 40 mg, 40 mg, Subcutaneous, Q24H, Volanda Napoleon, MD escitalopram (LEXAPRO) tablet 10 mg, 10 mg, Oral, Daily, Kristeen Miss, MD, 10 mg at 12/05/13 1102;  feeding supplement (ENSURE) (ENSURE) pudding 1 Container, 1 Container, Oral, TID BM, Kristeen Miss, MD, 1 Container at 12/04/13 2100;  fluconazole (DIFLUCAN) tablet 100 mg, 100 mg, Oral, Daily, Volanda Napoleon, MD, 100 mg at 12/05/13 1102;  HYDROcodone-acetaminophen (NORCO/VICODIN) 5-325 MG per tablet 1 tablet, 1 tablet, Oral, Q6H PRN, Kristeen Miss, MD insulin aspart (novoLOG) injection 0-15 Units, 0-15 Units, Subcutaneous, 6 times per day, Colbert Coyer, MD, 2 Units at 12/05/13 [phone];  ipratropium-albuterol (DUONEB) 0.5-2.5 (3) MG/3ML nebulizer solution 3 mL, 3 mL, Nebulization, Q4H PRN, Venetia Maxon Rama, MD;  levETIRAcetam (KEPPRA) tablet 500 mg, 500 mg, Oral, BID, Charlie Pitter, MD, 500 mg at 12/05/13 1102 loperamide (IMODIUM) capsule 2 mg, 2 mg, Oral, PRN, Kristeen Miss, MD, 2 mg at 11/30/13 1525;  multivitamin with minerals tablet 1 tablet, 1 tablet, Oral, Daily, Volanda Napoleon, MD, 1 tablet at 12/05/13 1102;  ondansetron (ZOFRAN) injection 4 mg, 4 mg, Intravenous, Q4H PRN, Kristeen Miss, MD;  ondansetron (ZOFRAN) tablet 4 mg, 4 mg, Oral, Q4H PRN, Kristeen Miss, MD pantoprazole (PROTONIX) EC tablet 40 mg, 40 mg, Oral, Daily, Volanda Napoleon, MD, 40 mg at 12/05/13 1102;  promethazine (PHENERGAN) tablet 12.5-25 mg, 12.5-25 mg, Oral, Q4H PRN, Kristeen Miss, MD;  [place], , Oral, PRN, Kristeen Miss, MD  Patients Current Diet: Dysphagia 3 diet with  nectar thick liquids  Precautions / Restrictions Precautions Precautions: Fall Precaution Comments: impulsive, poor safety cognition Restrictions Weight Bearing Restrictions: No   Prior Activity Level Limited Community (1-2x/wk): Went out 2-3 X a week  Development worker, international aid / [place] Devices/Equipment: Eyeglasses Home Equipment: Environmental consultant - 2 wheels;Wheelchair - manual  Prior Functional Level Prior Function Level of Independence: Independent  Current Functional Level Cognition  Arousal/Alertness: Awake/alert Overall Cognitive Status: Impaired/Different from baseline Current Attention Level: Focused (internally distracted) Orientation Level: Oriented to person Following Commands: Follows one step commands inconsistently Safety/Judgement: Decreased awareness of safety;Decreased awareness of deficits General Comments: pt able to read calendar on wall for orientation to time.  Pt remains impulsive Attention: Focused Focused Attention: Impaired Focused Attention Impairment: Verbal basic;Functional basic Memory: Impaired Memory Impairment: Storage deficit;Decreased recall of new information;Decreased short term memory Decreased Short Term Memory: Verbal basic;Functional basic Awareness: Impaired Awareness Impairment: Intellectual impairment;Emergent impairment;Anticipatory impairment  Problem Solving: Impaired Problem Solving Impairment: Verbal basic;Functional basic Behaviors: Impulsive;Perseveration Safety/Judgment: Impaired    Extremity Assessment (includes Sensation/Coordination)          ADLs  Overall ADL's : Needs assistance/impaired Eating/Feeding: Moderate assistance;Cueing for sequencing;Cueing for safety;Sitting Eating/Feeding Details (indicate cue type and reason): Pt requires hand over hand at times to self feed due to impulsiveness and perseveration. physical cues for bite size and speed. Demonstrated to husband technique for safe feeding. Pt  verbalized understanding. discussed need to minimize any distractions during meals due to poor attention and distractability. Grooming: Oral care;Brushing hair;Sitting (see general adl comments ) Grooming Details (indicate cue type and reason): Pt had difficult time recognizing toothpast. Mod cues for proper sequencing. Used biotene to rinse with sponge and spit in cup. Pt required hand over hand for impulse control . Educated husbna don how to work with pt's deficits in order to increase her independce with self care. Upper Body Bathing: Maximal assistance;Sitting Lower Body Bathing: Total assistance;+2 for physical assistance;+2 for safety/equipment;Sit to/from stand Upper Body Dressing : Maximal assistance Lower Body Dressing: Total assistance;Sit to/from stand Toilet Transfer: Maximal assistance;Stand-pivot;Cueing for safety Toileting- Clothing Manipulation and Hygiene: Total assistance Functional mobility during ADLs: Maximal assistance General ADL Comments: swabbed mouth for oral care: pt on thickened diet.  Pt was able to spit out and used squeezed sponge to rinse.  Pt was able to comb hair with guarding of back of head for protection.  Applied lotion with mod A.  worked intermittently on sitting up without support.  Pt able to hold this 1-2 minutes then got fatiqued.   slightly twisted body last attempt.  Pt was able to pull socks up by crossing legs with extra time to follow commands.  Pt had just gotten up to chair prior to OT.  Did not have toileting needs.  Pt was able to scoot herself forward in chair    Mobility  Overal bed mobility: Needs Assistance Bed Mobility: Supine to Sit Supine to sit: Min assist;Mod assist General bed mobility comments: impulsive.  Poor gross motor control.  Increased assist to complete scooting to EOB.  Close supervision when sitting EOB.  HIGH FALL RISK    Transfers  Overall transfer level: Needs assistance Equipment used: 2 person hand held  assist Transfers: Sit to/from [facility] Sit to Stand: +2 physical assistance;+2 safety/equipment;Max assist;Mod assist Stand pivot transfers: +2 physical assistance;Mod assist;Max assist General transfer comment: impulsive.  MAX VC's on hand placement with sit to stand and stand to sit.  Impaired gross motor control.  Assisted off bed and on/off commode.     Ambulation / Gait / Stairs / Wheelchair Mobility  Ambulation/Gait Ambulation/Gait assistance: +2 safety/equipment;+2 physical assistance;Max assist Ambulation Distance (Feet): 58 Feet Assistive device:  (EVA walker ) Gait Pattern/deviations: Step-to pattern;Step-through pattern;Ataxic;Narrow base of support Gait velocity: decreased General Gait Details: Used EVA walker and MAX posterior trunk support to prevent pt from sitting too soon and assist with weight shift to increase steppage.  Spouse assisted by walking in front of pt to encourage increased amb distance.      Posture / Balance Dynamic Sitting Balance Sitting balance - Comments: pt impulsively repeatedly attempted to return to supine in bed    Special needs/care consideration BiPAP/CPAP No CPM No Continuous Drip IV No Dialysis No        Life Vest No Oxygen No Special Bed No Trach Size No Wound Vac (area) No      Skin Bruises easily  Bowel mgmt: Had BM 12/04/13 Bladder mgmt: Voiding with incontinence at times; using BSC Diabetic mgmt No Daily radiation at [facility] daily via [place]: Spouse/significant other  Lives With: Spouse Available Help at Discharge: Family;Available 24 hours/day Type of Home: House Home Layout: Two level;Able to live on main level with bedroom/bathroom Alternate Level Stairs-Rails: Right Alternate Level Stairs-Number of Steps: one flight Home Access: Stairs to enter Entrance Stairs-Rails: None Entrance Stairs-Number of Steps:  3 Bathroom Shower/Tub: Chiropodist: Standard Bathroom Accessibility: Yes How Accessible: Accessible via walker Home Care Services: No Additional Comments: pt's husband provided home living info and states he will be available 24/7 at home.   Discharge Living Setting Plans for Discharge Living Setting: Patient's home;House;Lives with (comment) (Lives with husband) Type of Home at Discharge: House Discharge Home Layout: Two level;Able to live on main level with bedroom/bathroom Alternate Level Stairs-Number of Steps: Flight Discharge Home Access: Stairs to enter Entrance Stairs-Number of Steps: 3 Does the patient have any problems obtaining your medications?: No  Social/Family/Support Systems Patient Roles: Spouse;Parent (Has daughter and a niece) Contact Information: Chonte Ricke - spouse Anticipated Caregiver: Husband Anticipated Caregiver's Contact Information: Jenny Reichmann (h) [phone] (c) [phone] Ability/Limitations of Caregiver: Husband is retired and can Veterinary surgeon Availability: 24/7 Discharge Plan Discussed with Primary Caregiver: Yes Is Caregiver In Agreement with Plan?: Yes Does Caregiver/Family have Issues with Lodging/Transportation while Pt is in Rehab?: No  Goals/Additional Needs Patient/Family Goal for Rehab: PT S, OT S/min A, ST S/min A goals Expected length of stay: 13-20 days Cultural Considerations: None Dietary Needs: Dys 3, nectar thick liquids Equipment Needs: TBD Special Service Needs: Radiation therapy for 14 treatments.  Has completeted 3/14 as of 12/05/13 Pt/Family Agrees to Admission and willing to participate: Yes Program Orientation Provided & Reviewed with Pt/Caregiver Including Roles  & Responsibilities: Yes   Decrease burden of Care through IP rehab admission: N/A  Possible need for SNF placement upon discharge: Not anticipated   Patient Condition: This patient's medical and functional status has changed since the  consult dated: 12/03/13 in which the Rehabilitation Physician determined and documented that the patient's condition is appropriate for intensive rehabilitative care in an inpatient rehabilitation facility. See "History of Present Illness" (above) for medical update. Functional changes are: Currently requiring max assist +2 to ambulate 58 ft with EVA walker. Patient's medical and functional status update has been discussed with the Rehabilitation physician and patient remains appropriate for inpatient rehabilitation. Will admit to inpatient rehab today.  Preadmission Screen Completed By:  Retta Diones, 12/05/2013 5:50 PM ______________________________________________________________________   Discussed status with Dr. Naaman Plummer  On 12/06/13 at  [phone] and received telephone approval for admission today.  Admission Coordinator:  Retta Diones, time 0034 Date 12/06/2013.

## 2013-12-05 NOTE — Progress Notes (Signed)
Physical Therapy Treatment Patient Details Name: Katie [name] MRN: [id number] DOB: 07-02-1951 Today's Date: 12/05/2013    History of Present Illness 63 y.o. F s/p R parietal occipital craniotomy and resection of brain tumor 3/24.     PT Comments    Assisted pt OOB to [facility] to void then amb in hallway.  Pt progressing well and tolerated increased amb distance.   Follow Up Recommendations  CIR     Equipment Recommendations       Recommendations for Other Services Rehab consult     Precautions / Restrictions Precautions Precautions: Fall Precaution Comments: impulsive, poor safety cognition Restrictions Weight Bearing Restrictions: No    Mobility  Bed Mobility Overal bed mobility: Needs Assistance Bed Mobility: Supine to Sit     Supine to sit: Min assist;Mod assist     General bed mobility comments: impulsive.  Poor gross motor control.  Increased assist to complete scooting to EOB.  Close supervision when sitting EOB.  HIGH FALL RISK  Transfers Overall transfer level: Needs assistance Equipment used: 2 person hand held assist Transfers: Sit to/from [facility] Sit to Stand: +2 physical assistance;+2 safety/equipment;Max assist;Mod assist Stand pivot transfers: +2 physical assistance;Mod assist;Max assist       General transfer comment: impulsive.  MAX VC's on hand placement with sit to stand and stand to sit.  Impaired gross motor control.  Assisted off bed and on/off commode.   Ambulation/Gait Ambulation/Gait assistance: +2 safety/equipment;+2 physical assistance;Max assist Ambulation Distance (Feet): 58 Feet Assistive device:  (EVA walker ) Gait Pattern/deviations: Step-to pattern;Step-through pattern;Ataxic;Narrow base of support Gait velocity: decreased   General Gait Details: Used EVA walker and MAX posterior trunk support to prevent pt from sitting too soon and assist with weight shift to increase steppage.  Spouse assisted by walking in front  of pt to encourage increased amb distance.     Stairs            Wheelchair Mobility    Modified Rankin (Stroke Patients Only)       Balance                                    Cognition                            Exercises      General Comments        Pertinent Vitals/Pain     Home Living                      Prior Function            PT Goals (current goals can now be found in the care plan section) Progress towards PT goals: Progressing toward goals    Frequency  Min 4X/week    PT Plan      Co-evaluation             End of Session Equipment Utilized During Treatment: Gait belt Activity Tolerance: Patient tolerated treatment well Patient left: in chair;with call bell/phone within reach;with family/visitor present;with chair alarm set     Time: 2671-2458 PT Time Calculation (min): 28 min  Charges:  $Gait Training: 8-22 mins $Therapeutic Activity: 8-22 mins                    G Codes:      Rica Koyanagi  PTA [facility]  Acute  Rehab Pager      [phone]

## 2013-12-05 NOTE — Progress Notes (Signed)
Rehab admissions - I met with patient, husband and her sister.  All are in agreement to inpatient rehab admission.  I will contact [employer], open the case and request inpatient rehab admission.  Hopefully we will hear back tomorrow from [facility] about possible inpatient rehab admission.  My partner, Danne Baxter, will follow up tomorrow.  She can be reached at [phone] and I can be reached at [phone] for questions.

## 2013-12-05 NOTE — Discharge Instructions (Signed)

## 2013-12-05 NOTE — Progress Notes (Signed)
[place] [facility] Therapy Treatment Record Phone [phone]   Radiation Therapy was administered to Katie [name] on: 12/05/2013  9:00 AM and was treatment # 3 out of a planned course of 14 treatments.

## 2013-12-05 NOTE — Procedures (Signed)
Interventional Radiology Procedure Note  Procedure: Placement of a right IJ approach single lumen PowerPort.  Tip is positioned at the superior cavoatrial junction and catheter is ready for immediate use.  Complications: No immediate Recommendations:  - Ok to shower tomorrow - Do not submerge for 7 days - Routine line care   Signed,  Criselda Peaches, MD [facility]

## 2013-12-06 ENCOUNTER — Inpatient Hospital Stay (HOSPITAL_COMMUNITY)
Admission: AD | Admit: 2013-12-06 | Discharge: 2013-12-28 | DRG: 945 | Disposition: A | Discharge status: Home Health | Payer: BC Managed Care – PPO | Source: Intra-hospital | Attending: Physical Medicine & Rehabilitation | Admitting: Physical Medicine & Rehabilitation

## 2013-12-06 ENCOUNTER — Ambulatory Visit
Admit: 2013-12-06 | Discharge: 2013-12-06 | Disposition: A | Discharge status: Home / Self Care | Payer: BC Managed Care – PPO | Attending: Radiation Oncology | Admitting: Radiation Oncology

## 2013-12-06 DIAGNOSIS — N39498 Other specified urinary incontinence: Secondary | ICD-10-CM | POA: Diagnosis present

## 2013-12-06 DIAGNOSIS — T380X5A Adverse effect of glucocorticoids and synthetic analogues, initial encounter: Secondary | ICD-10-CM | POA: Diagnosis not present

## 2013-12-06 DIAGNOSIS — I10 Essential (primary) hypertension: Secondary | ICD-10-CM | POA: Diagnosis present

## 2013-12-06 DIAGNOSIS — R279 Unspecified lack of coordination: Secondary | ICD-10-CM | POA: Diagnosis present

## 2013-12-06 DIAGNOSIS — W19XXXA Unspecified fall, initial encounter: Secondary | ICD-10-CM

## 2013-12-06 DIAGNOSIS — C7931 Secondary malignant neoplasm of brain: Secondary | ICD-10-CM

## 2013-12-06 DIAGNOSIS — R609 Edema, unspecified: Secondary | ICD-10-CM

## 2013-12-06 DIAGNOSIS — Z5189 Encounter for other specified aftercare: Principal | ICD-10-CM

## 2013-12-06 DIAGNOSIS — R1310 Dysphagia, unspecified: Secondary | ICD-10-CM | POA: Diagnosis present

## 2013-12-06 DIAGNOSIS — J9600 Acute respiratory failure, unspecified whether with hypoxia or hypercapnia: Secondary | ICD-10-CM

## 2013-12-06 DIAGNOSIS — C7949 Secondary malignant neoplasm of other parts of nervous system: Secondary | ICD-10-CM

## 2013-12-06 DIAGNOSIS — R4182 Altered mental status, unspecified: Secondary | ICD-10-CM

## 2013-12-06 DIAGNOSIS — R7309 Other abnormal glucose: Secondary | ICD-10-CM | POA: Diagnosis not present

## 2013-12-06 DIAGNOSIS — E440 Moderate protein-calorie malnutrition: Secondary | ICD-10-CM

## 2013-12-06 DIAGNOSIS — C3490 Malignant neoplasm of unspecified part of unspecified bronchus or lung: Secondary | ICD-10-CM | POA: Diagnosis present

## 2013-12-06 DIAGNOSIS — F341 Dysthymic disorder: Secondary | ICD-10-CM

## 2013-12-06 DIAGNOSIS — R4701 Aphasia: Secondary | ICD-10-CM | POA: Diagnosis present

## 2013-12-06 DIAGNOSIS — E46 Unspecified protein-calorie malnutrition: Secondary | ICD-10-CM | POA: Diagnosis present

## 2013-12-06 DIAGNOSIS — F17200 Nicotine dependence, unspecified, uncomplicated: Secondary | ICD-10-CM | POA: Diagnosis present

## 2013-12-06 DIAGNOSIS — D496 Neoplasm of unspecified behavior of brain: Secondary | ICD-10-CM | POA: Diagnosis present

## 2013-12-06 DIAGNOSIS — F411 Generalized anxiety disorder: Secondary | ICD-10-CM | POA: Diagnosis present

## 2013-12-06 LAB — CBC
HEMATOCRIT: 31.5 % — AB (ref 36.0–46.0)
Hemoglobin: 10.5 g/dL — ABNORMAL LOW (ref 12.0–15.0)
MCH: 31.8 pg (ref 26.0–34.0)
MCHC: 33.3 g/dL (ref 30.0–36.0)
MCV: 95.5 fL (ref 78.0–100.0)
PLATELETS: 188 10*3/uL (ref 150–400)
RBC: 3.3 MIL/uL — ABNORMAL LOW (ref 3.87–5.11)
RDW: 14.1 % (ref 11.5–15.5)
WBC: 10.1 10*3/uL (ref 4.0–10.5)

## 2013-12-06 LAB — BASIC METABOLIC PANEL
BUN: 31 mg/dL — AB (ref 6–23)
CALCIUM: 7.9 mg/dL — AB (ref 8.4–10.5)
CHLORIDE: 98 meq/L (ref 96–112)
CO2: 29 meq/L (ref 19–32)
CREATININE: 0.55 mg/dL (ref 0.50–1.10)
GFR calc Af Amer: >90 mL/min (ref >90)
GFR calc non Af Amer: >90 mL/min (ref >90)
Glucose, Bld: 101 mg/dL — ABNORMAL HIGH (ref 70–99)
Potassium: 4.6 mEq/L (ref 3.7–5.3)
Sodium: 135 mEq/L — ABNORMAL LOW (ref 137–147)

## 2013-12-06 LAB — GLUCOSE, CAPILLARY
GLUCOSE-CAPILLARY: 143 mg/dL — AB (ref 70–99)
GLUCOSE-CAPILLARY: 174 mg/dL — AB (ref 70–99)
Glucose-Capillary: 98 mg/dL (ref 70–99)
Glucose-Capillary: 113 mg/dL — ABNORMAL HIGH (ref 70–99)
Glucose-Capillary: 168 mg/dL — ABNORMAL HIGH (ref 70–99)
Glucose-Capillary: 145 mg/dL — ABNORMAL HIGH (ref 70–99)
Glucose-Capillary: 199 mg/dL — ABNORMAL HIGH (ref 70–99)

## 2013-12-06 MED ORDER — FLEET ENEMA 7-19 GM/118ML RE ENEM: 1.0000 | ENEMA | Freq: Once | RECTAL | Status: AC | PRN

## 2013-12-06 MED ORDER — GUAIFENESIN-DM 100-10 MG/5ML PO SYRP: 5.0000 mL | ORAL_SOLUTION | Freq: Four times a day (QID) | ORAL | Status: DC | PRN

## 2013-12-06 MED ORDER — ALUM & MAG HYDROXIDE-SIMETH 200-200-20 MG/5ML PO SUSP: 30.0000 mL | ORAL | Status: DC | PRN

## 2013-12-06 MED ORDER — LOPERAMIDE HCL 2 MG PO CAPS
2.0000 mg | ORAL_CAPSULE | ORAL | Status: DC | PRN
  Filled 2013-12-06: qty 1

## 2013-12-06 MED ORDER — PANTOPRAZOLE SODIUM 40 MG PO TBEC
40.0000 mg | DELAYED_RELEASE_TABLET | Freq: Every day | ORAL | Status: DC
  Administered 2013-12-07 – 2013-12-28 (×22): 40 mg via ORAL
  Filled 2013-12-06 (×22): qty 1

## 2013-12-06 MED ORDER — ACETAMINOPHEN 325 MG PO TABS: 325.0000 mg | ORAL_TABLET | ORAL | Status: DC | PRN

## 2013-12-06 MED ORDER — HYDROCODONE-ACETAMINOPHEN 5-325 MG PO TABS: 1.0000 | ORAL_TABLET | Freq: Four times a day (QID) | ORAL | Status: DC | PRN

## 2013-12-06 MED ORDER — RESOURCE THICKENUP CLEAR PO POWD
ORAL | Status: DC | PRN
  Filled 2013-12-06 (×2): qty 125

## 2013-12-06 MED ORDER — FLUCONAZOLE 100 MG PO TABS
100.0000 mg | ORAL_TABLET | Freq: Every day | ORAL | Status: DC
  Administered 2013-12-07 – 2013-12-17 (×11): 100 mg via ORAL
  Filled 2013-12-06 (×13): qty 1

## 2013-12-06 MED ORDER — INSULIN ASPART 100 UNIT/ML ~~LOC~~ SOLN
0.0000 [IU] | SUBCUTANEOUS | Status: DC
  Administered 2013-12-06: 3 [IU] via SUBCUTANEOUS
  Administered 2013-12-07 (×3): 2 [IU] via SUBCUTANEOUS
  Administered 2013-12-07: 3 [IU] via SUBCUTANEOUS
  Administered 2013-12-07 – 2013-12-08 (×2): 2 [IU] via SUBCUTANEOUS
  Administered 2013-12-08: 5 [IU] via SUBCUTANEOUS
  Administered 2013-12-08 – 2013-12-09 (×2): 2 [IU] via SUBCUTANEOUS
  Administered 2013-12-09: 3 [IU] via SUBCUTANEOUS
  Administered 2013-12-09 (×3): 2 [IU] via SUBCUTANEOUS
  Administered 2013-12-10: 3 [IU] via SUBCUTANEOUS
  Administered 2013-12-10 (×3): 2 [IU] via SUBCUTANEOUS
  Administered 2013-12-11 (×2): 3 [IU] via SUBCUTANEOUS
  Administered 2013-12-11 – 2013-12-14 (×9): 2 [IU] via SUBCUTANEOUS
  Administered 2013-12-14: 3 [IU] via SUBCUTANEOUS
  Administered 2013-12-14 – 2013-12-18 (×17): 2 [IU] via SUBCUTANEOUS
  Administered 2013-12-18: 3 [IU] via SUBCUTANEOUS
  Administered 2013-12-18 – 2013-12-19 (×5): 2 [IU] via SUBCUTANEOUS
  Administered 2013-12-19: 3 [IU] via SUBCUTANEOUS
  Administered 2013-12-20 – 2013-12-22 (×7): 2 [IU] via SUBCUTANEOUS
  Administered 2013-12-22 (×2): 3 [IU] via SUBCUTANEOUS
  Administered 2013-12-23 – 2013-12-24 (×3): 2 [IU] via SUBCUTANEOUS
  Administered 2013-12-24: 0 [IU] via SUBCUTANEOUS
  Administered 2013-12-24 – 2013-12-25 (×3): 2 [IU] via SUBCUTANEOUS
  Administered 2013-12-25 – 2013-12-26 (×3): 3 [IU] via SUBCUTANEOUS
  Administered 2013-12-26 – 2013-12-27 (×8): 2 [IU] via SUBCUTANEOUS
  Administered 2013-12-28: 3 [IU] via SUBCUTANEOUS
  Administered 2013-12-28: 2 [IU] via SUBCUTANEOUS

## 2013-12-06 MED ORDER — ENSURE PUDDING PO PUDG
1.0000 | Freq: Three times a day (TID) | ORAL | Status: DC
  Administered 2013-12-06 – 2013-12-28 (×58): 1 via ORAL

## 2013-12-06 MED ORDER — PROCHLORPERAZINE 25 MG RE SUPP
12.5000 mg | Freq: Four times a day (QID) | RECTAL | Status: DC | PRN
  Filled 2013-12-06: qty 1

## 2013-12-06 MED ORDER — ENOXAPARIN SODIUM 40 MG/0.4ML ~~LOC~~ SOLN
40.0000 mg | SUBCUTANEOUS | Status: DC
  Administered 2013-12-07 – 2013-12-28 (×22): 40 mg via SUBCUTANEOUS
  Filled 2013-12-06 (×24): qty 0.4

## 2013-12-06 MED ORDER — PROCHLORPERAZINE EDISYLATE 5 MG/ML IJ SOLN
5.0000 mg | Freq: Four times a day (QID) | INTRAMUSCULAR | Status: DC | PRN
  Filled 2013-12-06: qty 2

## 2013-12-06 MED ORDER — ESCITALOPRAM OXALATE 10 MG PO TABS
10.0000 mg | ORAL_TABLET | Freq: Every day | ORAL | Status: DC
  Administered 2013-12-07 – 2013-12-28 (×23): 10 mg via ORAL
  Filled 2013-12-06 (×23): qty 1

## 2013-12-06 MED ORDER — PROCHLORPERAZINE MALEATE 5 MG PO TABS
5.0000 mg | ORAL_TABLET | Freq: Four times a day (QID) | ORAL | Status: DC | PRN
  Filled 2013-12-06: qty 2

## 2013-12-06 MED ORDER — BISACODYL 10 MG RE SUPP
10.0000 mg | Freq: Every day | RECTAL | Status: DC | PRN
Start: 2013-12-06 — End: 2013-12-28
  Filled 2013-12-06: qty 1

## 2013-12-06 MED ORDER — BIOTENE DRY MOUTH MT LIQD
15.0000 mL | Freq: Four times a day (QID) | OROMUCOSAL | Status: DC
  Administered 2013-12-07 – 2013-12-28 (×62): 15 mL via OROMUCOSAL

## 2013-12-06 MED ORDER — TRAZODONE HCL 50 MG PO TABS
25.0000 mg | ORAL_TABLET | Freq: Every evening | ORAL | Status: DC | PRN
  Filled 2013-12-06: qty 1

## 2013-12-06 MED ORDER — ADULT MULTIVITAMIN W/MINERALS CH
1.0000 | ORAL_TABLET | Freq: Every day | ORAL | Status: DC
  Administered 2013-12-07 – 2013-12-28 (×22): 1 via ORAL
  Filled 2013-12-06 (×23): qty 1

## 2013-12-06 MED ORDER — CYANOCOBALAMIN 500 MCG PO TABS
500.0000 ug | ORAL_TABLET | Freq: Every day | ORAL | Status: DC
  Administered 2013-12-07 – 2013-12-28 (×22): 500 ug via ORAL
  Filled 2013-12-06 (×25): qty 1

## 2013-12-06 MED ORDER — ESCITALOPRAM OXALATE 10 MG PO TABS: 10.0000 mg | ORAL_TABLET | Freq: Every day | ORAL | Status: DC

## 2013-12-06 MED ORDER — POLYETHYLENE GLYCOL 3350 17 G PO PACK
17.0000 g | PACK | Freq: Every day | ORAL | Status: DC | PRN
  Filled 2013-12-06: qty 1

## 2013-12-06 MED ORDER — ATORVASTATIN CALCIUM 10 MG PO TABS
10.0000 mg | ORAL_TABLET | Freq: Every evening | ORAL | Status: DC
  Administered 2013-12-06 – 2013-12-27 (×21): 10 mg via ORAL
  Filled 2013-12-06 (×23): qty 1

## 2013-12-06 MED ORDER — LEVETIRACETAM 500 MG PO TABS
500.0000 mg | ORAL_TABLET | Freq: Two times a day (BID) | ORAL | Status: DC
  Administered 2013-12-06 – 2013-12-28 (×44): 500 mg via ORAL
  Filled 2013-12-06 (×47): qty 1

## 2013-12-06 MED ORDER — IPRATROPIUM-ALBUTEROL 0.5-2.5 (3) MG/3ML IN SOLN: 3.0000 mL | RESPIRATORY_TRACT | Status: DC | PRN

## 2013-12-06 MED ORDER — DIPHENHYDRAMINE HCL 12.5 MG/5ML PO ELIX: 12.5000 mg | ORAL_SOLUTION | Freq: Four times a day (QID) | ORAL | Status: DC | PRN

## 2013-12-06 MED ORDER — DEXAMETHASONE 6 MG PO TABS
6.0000 mg | ORAL_TABLET | Freq: Three times a day (TID) | ORAL | Status: DC
  Administered 2013-12-06 – 2013-12-09 (×8): 6 mg via ORAL
  Filled 2013-12-06 (×12): qty 1

## 2013-12-06 NOTE — Progress Notes (Signed)
Pt arrived to unit at 1600 via carelink. Husband at bedside. Reviewed rehab process and booklet with pt and family with husband having verbal understanding. Bed alarm inplace, SRx3 up, husband at bedside.

## 2013-12-06 NOTE — Progress Notes (Signed)
[employer] has approved inpt rehab admission. I have spoken with her husband by phone and he is in agreement. I am coordinating with radiation oncology her daily treatments to coincide with her inpt rehab therapy. I have notified pt's RN as well as Dr. Marin Olp to arrange.I will discus with RN CM. [phone]

## 2013-12-06 NOTE — Progress Notes (Signed)
Physical Therapy Treatment Patient Details Name: Katie [name] MRN: [id number] DOB: 1950/10/31 Today's Date: 12/06/2013    History of Present Illness 63 y.o. F s/p R parietal occipital craniotomy and resection of brain tumor 3/24.     PT Comments    Assisted pt OOB to [facility] to void.  Assisted with hygiene.  Amb in hallway.  Positioned in recliner.   Follow Up Recommendations  CIR     Equipment Recommendations       Recommendations for Other Services       Precautions / Restrictions Precautions Precautions: Fall Precaution Comments: impulsive, poor safety cognition Restrictions Weight Bearing Restrictions: No    Mobility  Bed Mobility Overal bed mobility: Needs Assistance Bed Mobility: Supine to Sit     Supine to sit: Min assist     General bed mobility comments: impulsive.  Poor gross motor control.  Increased assist to complete scooting to EOB.  Close supervision when sitting EOB.  HIGH FALL RISK  Transfers Overall transfer level: Needs assistance Equipment used: 2 person hand held assist Transfers: Sit to/from [facility] Sit to Stand: +2 physical assistance;+2 safety/equipment;Max assist;Mod assist         General transfer comment: difficulty with self sit to stand and stand to sit as B knees buckle and B UE's require hand over hand cueing for proper placement.  Assisted OOB to [facility].    Ambulation/Gait Ambulation/Gait assistance: +2 safety/equipment;+2 physical assistance;Max assist Ambulation Distance (Feet): 65 Feet Assistive device: Right platform walker;Left platform walker (EVA walker) Gait Pattern/deviations: Step-to pattern;Step-through pattern;Ataxic;Narrow base of support Gait velocity: decreased   General Gait Details: Used EVA walker and MAX posterior trunk support to prevent pt from sitting too soon and assist with weight shift to increase steppage.  Spouse assisted by walking in front of pt to encourage increased amb distance.  Pt  more able to self rise trunk extension.     Stairs            Wheelchair Mobility    Modified Rankin (Stroke Patients Only)       Balance                                    Cognition                            Exercises      General Comments        Pertinent Vitals/Pain     Home Living                      Prior Function            PT Goals (current goals can now be found in the care plan section) Progress towards PT goals: Progressing toward goals    Frequency  Min 4X/week    PT Plan      Co-evaluation             End of Session Equipment Utilized During Treatment: Gait belt Activity Tolerance: Patient tolerated treatment well Patient left: in chair;with call bell/phone within reach;with family/visitor present;with chair alarm set     Time: 3662-9476 PT Time Calculation (min): 30 min  Charges:  $Gait Training: 8-22 mins $Therapeutic Activity: 8-22 mins  G Codes:      Rica Koyanagi  PTA [facility]  Acute  Rehab Pager      [phone]

## 2013-12-06 NOTE — Progress Notes (Signed)
Katie [name] is doing okay. She had her Port-A-Cath placed. This went without difficulties.  She did have some physical therapy yesterday. She's getting occupational therapy. Speech therapy is also helping with swallowing.  Distal leg swelling in the right parietal region. This is nontender. He does not appear to be worse.  She's had no cough. No nausea vomiting. However radiation is going well.  We are trying to see back in her over into CIR. I think this is going to be some insurance issue. This is being looked into by rehabilitation medicine.  Her labs look fine.  Is no thrush.  Her vital signs look stable. Blood pressure 108/65. Pulse 68. Temperature 98. Head and neck exam shows no thrush. Pupils reacted properly. She has a slight swelling on the right parotid region. This is nontender. It is not erythematous. Lungs are clear. Cardiac exam regular in rhythm. Abdomen is soft. She is good bowel sounds. Extremities shows no clubbing cyanosis or edema. She moves all extremities well. Neurological exam shows no focal neurological deficits. She does have a slight disorientation but this is pleasant in nature. Her speech she's been a little bit better.  We will continue to refer through radiation. We now have to see about getting her over to CIR  I appreciate all the great care that she is getting from all the consultants and from the staff on 3 E.   Frederich Cha 5:16

## 2013-12-06 NOTE — Progress Notes (Signed)
[facility] Therapy Treatment Record Phone [phone]   Radiation Therapy was administered to Katie [name] on: 12/06/2013  12:51 PM and was treatment # 4 out of a planned course of 10 treatments.

## 2013-12-06 NOTE — Discharge Summary (Signed)
Patient ID: Jerianne Anselmo MRN: [id number] DOB/AGE: [birth year] 63 y.o.  Admit date: 11/11/2013 Discharge date: 12/06/2013  Primary Care Physician:  Ezequiel Kayser, MD Attending MD: Dr. Marin Olp, Oncology  Discharge Diagnoses:    Syncope secondary to brain metastasis . Acute respiratory failure . Altered mental status, improving . Small cell lung cancer . HTN (hypertension) . Steroid-induced hyperglycemia . Fall with head trauma . Protein-calorie malnutrition, severe . Malignant cachexia . Metastatic cancer to brain . Leukocytosis, in the setting of inflammation, pain and steroids,resolved   Present on Admission:  . Syncope secondary to brain metastasis . Acute respiratory failure . Altered mental status . Small cell lung cancer . HTN (hypertension) . Steroid-induced hyperglycemia . Fall with head trauma . Protein-calorie malnutrition, severe . Malignant cachexia . Metastatic cancer to brain  Discharge Medications:    Medication List    ASK your doctor about these medications       aspirin EC 81 MG tablet  Take 81 mg by mouth daily.     atorvastatin 10 MG tablet  Commonly known as:  LIPITOR  Take 10 mg by mouth every evening.     cyanocobalamin 500 MCG tablet  Take 500 mcg by mouth daily.     escitalopram 10 MG tablet  Commonly known as:  LEXAPRO  Take 10 mg by mouth every morning.         Disposition and Follow-up: Stale to the Rehab Unit  Significant Diagnostic Studies:  Dg Abd 1 View  11/18/2013   CLINICAL DATA:  NG tube replaced for feeding.  EXAM: ABDOMEN - 1 VIEW  COMPARISON:  KUB from yesterday  FINDINGS: There is a nasogastric tube with tip terminating in the proximal stomach. The side port is at the GE junction.  There is motion artifact, decreasing sensitivity. Contrast remains present throughout the colon, without evidence of obstruction. No gross opacification of the lung bases.  IMPRESSION: Nasogastric tube enters the proximal stomach,  with side port at the GE junction. Advancement by 5 cm would provide more secure positioning.   Electronically Signed   By: Jorje Guild M.D.   On: 11/18/2013 05:03   Ct Head Wo Contrast  11/28/2013   CLINICAL DATA:  Neuro status change.  EXAM: CT HEAD WITHOUT CONTRAST  TECHNIQUE: Contiguous axial images were obtained from the base of the skull through the vertex without intravenous contrast.  COMPARISON:  11/23/2013  FINDINGS: Status post removal of a subgaleal drain which was present over a recent right parietal craniotomy. There is accumulation of sub galeal fluid around the bone flap, up to 9 mm in thickness. Gas in the subgaleal space and extra-axial spaces is again present. There is trace extra-axial fluid at the level of the bone flap, measuring no more than 2 mm. The dura or associated membrane is again seen as a thin high-density line. No postoperative hematoma.  More well-defined low attenuation in the temporoparietal region, the site of recently resected mass. No evidence of acute hemorrhage. No increasing local mass effect. High-density mass in the right frontal lobe, measuring up to 24 mm, is unchanged in appearance, including small satellite nodule located more ventrally and anteriorly. Associated vasogenic edema is not increased.  These changes result in similar mass effect, causing left ward midline shift of 6-7 mm. When accounting for differences in scan angle, of the shift is stable from prior. No overt hydrocephalus. Similar lateral ventricular volume. No evidence of acute hemorrhage, cortical infarct, or herniation.  IMPRESSION: 1. Subgaleal fluid  accumulation after drain removal, suggesting ongoing CSF leak. Minimal epidural fluid accumulation around the bone flap. 2. Progressive decreased density in the right parietal resection bed which may reflect clearing blood products or evolving infarct. 3. 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 4. Given differences  in scan angle, unchanged leftward shift - up to 7 mm.   Electronically Signed   By: Jorje Guild M.D.   On: 11/28/2013 00:01   Ct Head Wo Contrast  11/23/2013   CLINICAL DATA:  Altered mental status. Status post resection of brain tumors.  EXAM: CT HEAD WITHOUT CONTRAST  TECHNIQUE: Contiguous axial images were obtained from the base of the skull through the vertex without intravenous contrast.  COMPARISON:  Multiple prior CTs of the head, the most recent of which was performed 11/20/2013  FINDINGS: Suspected trace blood at the site of right temporoparietal mass resection is somewhat less apparent. A few small foci of increased attenuation at the operative site are nonspecific; trace residual mass cannot be excluded. Surrounding vasogenic edema is again seen. The 2.3 x 2.0 cm mass at the right frontal lobe is essentially unchanged, with surrounding vasogenic edema. A tiny focus of increased attenuation more inferiorly may reflect slight nodular extension of the mass. There is mild mass effect due to vasogenic edema, with perhaps 4 mm of leftward midline shift, improved from prior studies.  No definite intraventricular blood is seen. No significant subdural or subarachnoid hemorrhage is identified. Trace residual pneumocephalus is seen at the surgical site.  The posterior fossa, including the cerebellum, brainstem and fourth ventricle, is within normal limits.  A right temporoparietal craniotomy flap is again noted, with overlying skin staples and drainage catheter. The visualized portions of the orbits are within normal limits. The paranasal sinuses and mastoid air cells are well-aerated. No significant soft tissue abnormalities are seen.  IMPRESSION: 1. Suspected trace blood at the site of right temporoparietal mass resection is somewhat less apparent. Few small foci of increased attenuation at the operative site are nonspecific; trace residual mass cannot be excluded. Surrounding vasogenic edema again seen. 2.  2.3 x 2.0 cm mass at the right frontal lobe is essentially unchanged, with surrounding vasogenic edema. Mild mass effect noted, with perhaps 4 mm of leftward midline shift, improved from prior studies. 3. Trace residual pneumocephalus noted at the surgical site.   Electronically Signed   By: Garald Balding M.D.   On: 11/23/2013 03:26   Ct Head Wo Contrast  11/20/2013   CLINICAL DATA:  Persistent lethargy status post craniotomy for resection of lung cancer brain metastasis.  EXAM: CT HEAD WITHOUT CONTRAST  TECHNIQUE: Contiguous axial images were obtained from the base of the skull through the vertex without intravenous contrast.  COMPARISON:  11/13/2013  FINDINGS: Sequelae of right parietal craniotomy for tumor resection are again identified. Pneumocephalus has resolved. There is trace extra-axial fluid at the craniotomy site. Small amount of blood products at the resection site have decreased in conspicuity. Vasogenic edema in this region it has also decreased. There is decreased effacement of the right lateral ventricle and decreased leftward midline shift, now measuring 7 mm (previously 11 mm). Trace blood is present in the occipital horn of the left lateral ventricle. There is no ventricular dilatation. 2.2 x 2.0 cm right frontal mass is unchanged, as is an adjacent 5 mm density anteroinferior to the mass which could reflect a small focus of hemorrhage as suggested on the prior CT or instead may represent the inferior  most portion of the mass. Vasogenic edema surrounding the right frontal lobe mass is similar to the prior study.  Hypoattenuation involving cortex and white matter in the medial left parietal lobe is again seen although this is slightly less prominent than on the prior CT. Hypoattenuation in the region of the left hippocampal tail similar to the prior study. The visualized paranasal sinuses and mastoid air cells are clear. Orbits are unremarkable.  IMPRESSION: 1. Evolving postoperative changes in  the right temporoparietal region with decreased edema and midline shift. 2. Unchanged appearance of right frontal mass and surrounding edema. 3. Trace blood in the occipital horn of the left lateral ventricle. 4. Stable to slightly decreased conspicuity of medial left parietal hypoattenuation, which could reflect ischemia.   Electronically Signed   By: Logan Bores   On: 11/20/2013 08:33   Ct Head Wo Contrast  11/13/2013   CLINICAL DATA:  Follow-up brain tumor resection.  EXAM: CT HEAD WITHOUT CONTRAST  TECHNIQUE: Contiguous axial images were obtained from the base of the skull through the vertex without intravenous contrast.  COMPARISON:  MR HEAD WO/W CM dated 11/11/2013; CT HEAD W/O CM dated 11/11/2013  FINDINGS: Status post interval right frontal parietal craniotomy for debulking/ resection of right temporoparietal mass. Small amount of hyperdense blood products and air within the resection cavity. Small amount of extra-axial pneumocephalus mass seen.  No apparent change in mildly hyperdense right frontal lobe 22 x 19 mm mass, with a subcentimeter focus along the inferior margin of hyperdense possible blood products, axial 15/33. Right cerebral probable vasogenic edema, corresponding to MRI abnormality. 10 mm right to left midline shift, previously 12 mm. The right lateral ventricle remains predominantly the face, however the left ventricle system is decompressed, no entrapment on today's examination. Patchy hypodensity within the left mesial parietal lobe, axial 20/ 33 and evolving the gray and white matter.  Slight re-expansion of supercerebellar cistern, with persistent effacement of the quadrigeminal/ambient cistern. Very mild right uncal herniation is similar.  Mild calcific atherosclerosis of the carotid siphons. Visualized paranasal sinuses remain well aerated. Included ocular globes and orbital contents are nonsuspicious.  IMPRESSION: Status post interval right frontal parietal craniotomy for debulking/  resection of known right temporal parietal mass, with expected postoperative changes including minimal air and blood within the resection cavity. Small amount of extra-axial pneumocephalus. Overall less mass effect with residual 10 mm right to left midline shift (improved), without hydrocephalus or ventricular entrapment on today's study.  Focal loss of the gray-white matter junction involving the mesial left parietal lobe concerning for acute ischemia.  Stable appearance of 22 x 19 mm right frontal lobe mass, however there is a subcentimeter focus of apparent blood along the inferior margin of the mass which appears new.   Electronically Signed   By: Elon Alas   On: 11/13/2013 06:33   Ct Chest W Contrast  12/04/2013   CLINICAL DATA:  Lung cancer.  EXAM: CT CHEST, ABDOMEN, AND PELVIS WITH CONTRAST  TECHNIQUE: Multidetector CT imaging of the chest, abdomen and pelvis was performed following the standard protocol during bolus administration of intravenous contrast.  CONTRAST:  164mL OMNIPAQUE IOHEXOL 300 MG/ML  SOLN  COMPARISON:  CT CHEST W/CM dated 11/14/2013  FINDINGS: CT CHEST FINDINGS  Bulky subcarinal adenopathy measures up to 2.7 x 3.2 cm, stable. No hilar or axillary adenopathy. Atherosclerotic calcification of the arterial vasculature, including three-vessel involvement of the coronary arteries. Heart size normal. No pericardial effusion.  Centrilobular emphysema. Image quality is degraded  by respiratory motion. Minimal dependent atelectasis bilaterally. Left lower lobe nodule measures 1.7 x 1.9 cm (image 47), stable. Lungs are otherwise clear. No pleural fluid. Adherent debris is seen in the upper trachea.  CT ABDOMEN AND PELVIS FINDINGS  There may be very mild periportal edema. Liver, gallbladder, adrenal glands, kidneys, spleen, pancreas, stomach and bowel are otherwise unremarkable. A small amount of high density debris is seen dependently in the right aspect of the bladder (series 2, image one  hundred fifteen). Uterus and ovaries are visualized. Atherosclerotic calcification of the arterial vasculature without abdominal aortic aneurysm. No pathologically enlarged lymph nodes. No free fluid.  There is a 6 mm of lucent lesion in the left inferior pubic ramus (image 126, series 2), as on the prior exam. Degenerative changes are seen in the spine.  IMPRESSION: 1. Left lower lobe nodule and bulky subcarinal adenopathy are stable from 11/14/2013. 2. Small lucent lesion in the left inferior pubic ramus, stable. Continued attention on followup exams is warranted. 3. Three-vessel coronary artery calcification. 4. Small amount of high density debris seen dependently in the right lateral aspect of the bladder is of uncertain etiology. Given that it is new in the short interval from 11/14/2013, a lesion such as transitional cell carcinoma is considered highly unlikely.   Electronically Signed   By: Lorin Picket M.D.   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 Sagittal and coronal MPR images reconstructed from axial data set.  CONTRAST:  175mL OMNIPAQUE IOHEXOL 300 MG/ML  SOLN  COMPARISON:  None  FINDINGS: CT CHEST FINDINGS  Tip of endotracheal tube 3.3 cm above carinal.  Nasogastric tube extends into stomach.  Scattered aortic and coronary arterial calcifications.  Four low-attenuation likely necrotic enlarged subcarinal of lymph nodes identified, measuring 2.5 cm, 2.6 cm, 2.2 cm and 2.2 cm in short axes.  No additional thoracic adenopathy.  Scattered subcutaneous and soft tissue edema.  Vascular structures grossly patent on  nondedicated exam.  Lobulated left lower lobe mass 2.0 x 1.7 cm image 42, approximately 3 cm length.  Mild atelectasis in medial left lower lobe.  Remaining lungs clear.  No pleural effusion, pneumothorax, or additional mass/nodule.  No acute osseous findings.  CT ABDOMEN AND PELVIS FINDINGS  Liver, spleen, pancreas, kidneys, and adrenal glands normal appearance.  Distended gallbladder, otherwise unremarkable.  Stomach decompressed with suboptimal assessment of gastric wall thickness.  Normal appendix.  Air and Foley catheter within urinary bladder.  Small amount of nonspecific presacral soft tissue edema.  Bowel loops otherwise unremarkable.  Few normal-sized retroperitoneal lymph nodes seen.  Scattered atherosclerotic calcification.  No mass, adenopathy, or ascites.  Unremarkable uterus and adnexae.  No acute osseous findings.  IMPRESSION: Lobulated left lower lobe mass 2.0 x 1.7 cm, approximately 3 cm length, consistent with tumor.  Subcarinal adenopathy, low attenuation question necrotic, largest node 2.6 cm short axis.  No intra-abdominal or intrapelvic metastatic disease identified.  Subsegmental atelectasis left lower lobe.   Electronically Signed   By: Lavonia Dana M.D.   On: 11/14/2013 17:00   Mr Jeri Cos HA Contrast  11/12/2013   CLINICAL DATA:  Syncopal episode, intracranial masses.  EXAM: MRI  HEAD WITHOUT AND WITH CONTRAST  TECHNIQUE: Multiplanar, multiecho pulse sequences of the brain and surrounding structures were obtained without and with intravenous contrast for surgical planning.  CONTRAST:  37mL MULTIHANCE GADOBENATE DIMEGLUMINE 529 MG/ML IV SOLN  COMPARISON:  CT HEAD W/O CM dated 11/11/2013; CT HEAD W/O CM dated 12/15/2009  FINDINGS: Moderately motion degraded examination, per technologist patient was unable to remain still for the examination.  Heterogeneous may enhancing right temporal parietal occipital at least 4.4 x 6 cm (transverse by AP) mass with surrounding T2 FLAIR hyperintense vasogenic  edema and possibly nonenhancing tumor. T2 bright signal along the temporal stem extending into the internal capsule, external capsule on the right. Effacement of the right temporal horn, occipital horn with left lateral ventricle/atrial enlargement concerning for entrapment with FLAIR hyperintense probable transependymal flow cerebral spinal fluid. 10 mm of right-to-left midline shift.  Similar characteristic 2.3 x 2.3 cm mass in high right frontal lobe, the gray-white matter junction. Extensor surrounding FLAIR hyperintense signal.  Additional scattered subcentimeter supratentorial white matter lesions, with no definite enhancement. No extra-axial masses no abnormal leptomeningeal enhancement. Effacement of the right quadrigeminal/ambient cistern deforming the mid brain. Mildly effaced suprasellar cistern.  IMPRESSION: Moderately motion degraded limited MRI of the brain for surgical planning.  Heterogeneously enhancing 4.4 x 6 cm right temporal parietal occipital mass with extensive surrounding presumed vasogenic edema, though a component infiltrated nonenhancing tumor not excluded. In addition, similar appearing high right frontal lobe 2.3 x 2.3. Constellation of findings likely reflect metastatic disease, less likely lymphoma or possibly primary brain tumors.  10 mm of right-to-left midline shift with left ventricle entrapment/hydrocephalus. Effaced basal cisterns.  Additional subcentimeter white matter lesions favor chronic small vessel ischemic disease, incompletely characterized on this motion degraded limited MRI.   Electronically Signed   By: Elon Alas   On: 11/12/2013 00:32   Ct Abdomen Pelvis W Contrast  12/04/2013   CLINICAL DATA:  Lung cancer.  EXAM: CT CHEST, ABDOMEN, AND PELVIS WITH CONTRAST  TECHNIQUE: Multidetector CT imaging of the chest, abdomen and pelvis was performed following the standard protocol during bolus administration of intravenous contrast.  CONTRAST:  171mL OMNIPAQUE  IOHEXOL 300 MG/ML  SOLN  COMPARISON:  CT CHEST W/CM dated 11/14/2013  FINDINGS: CT CHEST FINDINGS  Bulky subcarinal adenopathy measures up to 2.7 x 3.2 cm, stable. No hilar or axillary adenopathy. Atherosclerotic calcification of the arterial vasculature, including three-vessel involvement of the coronary arteries. Heart size normal. No pericardial effusion.  Centrilobular emphysema. Image quality is degraded by respiratory motion. Minimal dependent atelectasis bilaterally. Left lower lobe nodule measures 1.7 x 1.9 cm (image 47), stable. Lungs are otherwise clear. No pleural fluid. Adherent debris is seen in the upper trachea.  CT ABDOMEN AND PELVIS FINDINGS  There may be very mild periportal edema. Liver, gallbladder, adrenal glands, kidneys, spleen, pancreas, stomach and bowel are otherwise unremarkable. A small amount of high density debris is seen dependently in the right aspect of the bladder (series 2, image one hundred fifteen). Uterus and ovaries are visualized. Atherosclerotic calcification of the arterial vasculature without abdominal aortic aneurysm. No pathologically enlarged lymph nodes. No free fluid.  There is a 6 mm of lucent lesion in the left inferior pubic ramus (image 126, series 2), as on the prior exam. Degenerative changes are seen in the spine.  IMPRESSION: 1. Left lower lobe nodule and bulky subcarinal adenopathy are stable from 11/14/2013. 2. Small lucent lesion in the left inferior pubic ramus, stable. Continued attention on followup  exams is warranted. 3. Three-vessel coronary artery calcification. 4. Small amount of high density debris seen dependently in the right lateral aspect of the bladder is of uncertain etiology. Given that it is new in the short interval from 11/14/2013, a lesion such as transitional cell carcinoma is considered highly unlikely.   Electronically Signed   By: Lorin Picket M.D.   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 Sagittal and coronal MPR images reconstructed from axial data set.  CONTRAST:  160mL OMNIPAQUE IOHEXOL 300 MG/ML  SOLN  COMPARISON:  None  FINDINGS: CT CHEST FINDINGS  Tip of endotracheal tube 3.3 cm above carinal.  Nasogastric tube extends into stomach.  Scattered aortic and coronary arterial calcifications.  Four low-attenuation likely necrotic enlarged subcarinal of lymph nodes identified, measuring 2.5 cm, 2.6 cm, 2.2 cm and 2.2 cm in short axes.  No additional thoracic adenopathy.  Scattered subcutaneous and soft tissue edema.  Vascular structures grossly patent on nondedicated exam.  Lobulated left lower lobe mass 2.0 x 1.7 cm image 42, approximately 3 cm length.  Mild atelectasis in medial left lower lobe.  Remaining lungs clear.  No pleural effusion, pneumothorax, or additional mass/nodule.  No acute osseous findings.  CT ABDOMEN AND PELVIS FINDINGS  Liver, spleen, pancreas, kidneys, and adrenal glands normal appearance.  Distended gallbladder, otherwise unremarkable.  Stomach decompressed with suboptimal assessment of gastric wall thickness.  Normal appendix.  Air and Foley catheter within urinary bladder.  Small amount of nonspecific presacral soft tissue edema.  Bowel loops otherwise unremarkable.  Few normal-sized retroperitoneal lymph nodes seen.  Scattered atherosclerotic calcification.  No mass, adenopathy, or ascites.  Unremarkable uterus and adnexae.  No acute osseous findings.  IMPRESSION: Lobulated left lower lobe mass 2.0 x 1.7 cm, approximately 3 cm length, consistent with tumor.  Subcarinal adenopathy, low attenuation question necrotic,  largest node 2.6 cm short axis.  No intra-abdominal or intrapelvic metastatic disease identified.  Subsegmental atelectasis left lower lobe.   Electronically Signed   By: Lavonia Dana M.D.   On: 11/14/2013 17:00   Ir Fluoro Guide Cv Line Right  12/05/2013   CLINICAL DATA:  63 year old female with metastatic small cell lung carcinoma including right parietal intracranial metastasis.  EXAM: IR ULTRASOUND GUIDANCE VASC ACCESS RIGHT; IR RIGHT FLOURO GUIDE CV LINE  Date: 12/05/2013  ANESTHESIA/SEDATION: Moderate (conscious) sedation was administered during this procedure. A total of 1 mg Versed and 25 mg Fentanyl were administered intravenously. The patient's vital signs were monitored continuously by radiology nursing throughout the course of the procedure.  Total sedation time: 20 minutes  FLUOROSCOPY TIME:  48 seconds  TECHNIQUE: The right neck and chest was prepped with chlorhexidine, and draped in the usual sterile fashion using maximum barrier technique (cap and mask, sterile gown, sterile gloves, large sterile sheet, hand hygiene and cutaneous antiseptic). Antibiotic prophylaxis was provided with 2g Ancef administered IV one hour prior to skin incision. Local anesthesia was attained by infiltration with 1% lidocaine with epinephrine.  Ultrasound demonstrated patency of the right internal jugular vein, and this was documented with an image. Under real-time ultrasound guidance, this vein was accessed with a 21 gauge  micropuncture needle and image documentation was performed. A small dermatotomy was made at the access site with an 11 scalpel. A 0.018" wire was advanced into the SVC and the access needle exchanged for a 16F micropuncture vascular sheath. The 0.018" wire was then removed and a 0.035" wire advanced into the IVC.  An appropriate location for the subcutaneous reservoir was selected below the clavicle and an incision was made through the skin and underlying soft tissues. The subcutaneous tissues were then  dissected using a combination of blunt and sharp surgical technique and a pocket was formed. A single lumen power injectable portacatheter was then tunneled through the subcutaneous tissues from the pocket to the dermatotomy and the port reservoir placed within the subcutaneous pocket.  The venous access site was then serially dilated and a peel away vascular sheath placed over the wire. The wire was removed and the port catheter advanced into position under fluoroscopic guidance. The catheter tip is positioned in the upper right atrium. This was documented with a spot image. The portacatheter was then tested and found to flush and aspirate well. The port was flushed with saline followed by 100 units/mL heparinized saline.  The pocket was then closed in two layers using first subdermal inverted interrupted absorbable sutures followed by a running subcuticular suture. The epidermis was then sealed with Dermabond. The dermatotomy at the venous access site was also closed with a single inverted subdermal suture and the epidermis sealed with Dermabond.  COMPLICATIONS: None.  The patient tolerated the procedure well.  IMPRESSION: Successful placement of a right IJ approach Power Port with ultrasound and fluoroscopic guidance. The catheter is ready for use.  Signed,  Criselda Peaches, MD  [facility]  [facility]   Electronically Signed   By: Jacqulynn Cadet M.D.   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  EXAM: IR ULTRASOUND GUIDANCE VASC ACCESS RIGHT; IR RIGHT FLOURO GUIDE CV LINE  Date: 12/05/2013  ANESTHESIA/SEDATION: Moderate (conscious) sedation was administered during this procedure. A total of 1 mg Versed and 25 mg Fentanyl were administered intravenously. The patient's vital signs were monitored continuously by  radiology nursing throughout the course of the procedure.  Total sedation time: 20 minutes  FLUOROSCOPY TIME:  48 seconds  TECHNIQUE: The right neck and chest was prepped with chlorhexidine, and draped in the usual sterile fashion using maximum barrier technique (cap and mask, sterile gown, sterile gloves, large sterile sheet, hand hygiene and cutaneous antiseptic). Antibiotic prophylaxis was provided with 2g Ancef administered IV one hour prior to skin incision. Local anesthesia was attained by infiltration with 1% lidocaine with epinephrine.  Ultrasound demonstrated patency of the right internal jugular vein, and this was documented with an image. Under real-time ultrasound guidance, this vein was accessed with a 21 gauge micropuncture needle and image documentation was performed. A small dermatotomy was made at the access site with an 11 scalpel. A 0.018" wire was advanced into the SVC and the access needle exchanged for a 16F micropuncture vascular sheath. The 0.018" wire was then removed and a 0.035" wire advanced into the IVC.  An appropriate location for the subcutaneous reservoir was selected below the clavicle and an incision was made through the skin and underlying soft tissues. The subcutaneous tissues were then dissected using a combination of blunt and sharp surgical technique and a  pocket was formed. A single lumen power injectable portacatheter was then tunneled through the subcutaneous tissues from the pocket to the dermatotomy and the port reservoir placed within the subcutaneous pocket.  The venous access site was then serially dilated and a peel away vascular sheath placed over the wire. The wire was removed and the port catheter advanced into position under fluoroscopic guidance. The catheter tip is positioned in the upper right atrium. This was documented with a spot image. The portacatheter was then tested and found to flush and aspirate well. The port was flushed with saline followed by 100  units/mL heparinized saline.  The pocket was then closed in two layers using first subdermal inverted interrupted absorbable sutures followed by a running subcuticular suture. The epidermis was then sealed with Dermabond. The dermatotomy at the venous access site was also closed with a single inverted subdermal suture and the epidermis sealed with Dermabond.  COMPLICATIONS: None.  The patient tolerated the procedure well.  IMPRESSION: Successful placement of a right IJ approach Power Port with ultrasound and fluoroscopic guidance. The catheter is ready for use.  Signed,  Criselda Peaches, MD  [facility]  [facility]   Electronically Signed   By: Jacqulynn Cadet M.D.   On: 12/05/2013 17:49   Dg Chest Port 1 View  11/19/2013   CLINICAL DATA:  Weakness, hypoxia, and shortness of breath, history of tobacco use  EXAM: PORTABLE CHEST - 1 VIEW  COMPARISON:  DG CHEST 1V PORT dated 11/16/2013  FINDINGS: The lungs remain hyperinflated. There is no focal infiltrate. The interstitial markings are mildly prominent diffusely but not greatly changed. The cardiac silhouette is normal in size. The pulmonary vascularity is not engorged. The mediastinum is normal in width. The proximal port of the esophagogastric tube is at the GE junction and advancement by 5-10 cm is recommended.  IMPRESSION: There is hyperinflation consistent with COPD. Mild prominence of the interstitial markings may reflect minimal interstitial edema or interstitial pneumonia. There is no alveolar pneumonia nor pleural effusion.   Electronically Signed   By: David  Martinique   On: 11/19/2013 09:09   Dg Chest Port 1 View  11/16/2013   CLINICAL DATA:  Evaluate left lung base atelectasis.  EXAM: PORTABLE CHEST - 1 VIEW  COMPARISON:  DG CHEST 1V PORT dated 11/15/2013  FINDINGS: The densities at the left lung base have nearly resolved. No focal lung densities or airspace disease. Nasogastric tube extends into the  upper abdomen. There is contrast within the bowel just underneath the left hemidiaphragm. Heart size is stable.  IMPRESSION: The left basilar lung densities have nearly resolved.   Electronically Signed   By: Markus Daft M.D.   On: 11/16/2013 08:38   Dg Chest Port 1 View  11/15/2013   CLINICAL DATA:  Intubation.  EXAM: PORTABLE CHEST - 1 VIEW  COMPARISON:  CT CHEST W/CM dated 11/14/2013; DG CHEST 1V PORT dated 11/14/2013  FINDINGS: Endotracheal tube and NG tube in stable position. Left lower lobe infiltrate noted consistent with pneumonia. No pleural effusion or pneumothorax. Heart size and pulmonary vascularity normal.  IMPRESSION: 1. Endotracheal tube and NG tube in good anatomic position. 2. Left lower lobe infiltrate consistent with pneumonia.   Electronically Signed   By: Marcello Moores  Register   On: 11/15/2013 08:04   Dg Chest Port 1 View  11/14/2013   CLINICAL DATA:  Endotracheal tube evaluation  EXAM: PORTABLE CHEST - 1 VIEW  COMPARISON:  DG CHEST 1V PORT  dated 11/13/2013  FINDINGS: Hyperaeration. Tubular device is stable. Minimal patchy density at the left base stable. Right lung is clear. No pneumothorax.  IMPRESSION: Stable minimal patchy density at the left base.   Electronically Signed   By: Maryclare Bean M.D.   On: 11/14/2013 07:52   Dg Chest Port 1 View  11/13/2013   CLINICAL DATA:  Evaluate endotracheal tube position.  EXAM: PORTABLE CHEST - 1 VIEW  COMPARISON:  DG CHEST 1V PORT dated 11/12/2013; DG CHEST 1V PORT dated 11/11/2013; DG CHEST 2V dated 12/15/2009  FINDINGS: Endotracheal tube is roughly 3.1 cm above the carina. Slightly increased densities in the retrocardiac space probably represent atelectasis or vascular crowding. Again noted is underlying hyperinflation. No evidence for a pneumothorax. Stable appearance of the heart and mediastinum. Heart size is normal. Nasogastric tube extends into the abdomen.  IMPRESSION: Endotracheal tube is appropriately positioned.  Questionable retrocardiac density  could represent focal atelectasis. Recommend attention on follow-up imaging.   Electronically Signed   By: Markus Daft M.D.   On: 11/13/2013 08:09   Dg Chest Port 1 View  11/12/2013   CLINICAL DATA:  Metastatic brain tumor.  Craniotomy yesterday.  EXAM: PORTABLE CHEST - 1 VIEW  COMPARISON:  11/11/2013  FINDINGS: Endotracheal tube and the NG tube appear in good position.  Lungs appear clear.  No effusions.  No acute osseous abnormality.  IMPRESSION: Clear lungs.   Electronically Signed   By: Rozetta Nunnery M.D.   On: 11/12/2013 10:22   Dg Abd Portable 1v  11/17/2013   CLINICAL DATA:  NG tube placement.  EXAM: PORTABLE ABDOMEN - 1 VIEW  COMPARISON:  11/12/2013  FINDINGS: NG tube remains in the mid portion of the stomach. Gas and contrast material noted throughout the colon. No evidence of small bowel distention/obstruction. No free air. No organomegaly. No acute bony abnormality.  IMPRESSION: No evidence of bowel obstruction.  NG tube tip in the mid portion of the stomach.   Electronically Signed   By: Rolm Baptise M.D.   On: 11/17/2013 08:26   Dg Abd Portable 1v  11/12/2013   CLINICAL DATA:  Nasogastric tube placement.  EXAM: PORTABLE ABDOMEN - 1 VIEW  COMPARISON:  No priors.  FINDINGS: Single view of the abdomen demonstrates a nasogastric tube with tip in the body of the stomach and side port near the gastroesophageal junction. Visualized bowel gas pattern is nonobstructive.  IMPRESSION: 1. Tip of nasogastric tube is in the body of the stomach.   Electronically Signed   By: Vinnie Langton M.D.   On: 11/12/2013 05:05    Discharge Laboratory Values: Lab Results  Component Value Date   WBC 10.1 12/06/2013   HGB 10.5* 12/06/2013   HCT 31.5* 12/06/2013   MCV 95.5 12/06/2013   PLT 188 12/06/2013   Lab Results  Component Value Date   NA 135* 12/06/2013   K 4.6 12/06/2013   CL 98 12/06/2013   CO2 29 12/06/2013    Brief H and P: For complete details please refer to admission H and P, but in brief, Katie [name] is a 63 y.o. female admitted  on 11/11/13 after a syncopal episode with subsequent fall, injuringthe left frontal region. A CT scan of the brain, done on admission, demonstrated 2 large right-sided lesions in the frontal and parietal/occipital area with 12 mm of shift and subfalcine herniation.She underwent right parietal/occipital craniotomy with resection of brain tumor on 11/12/13. Pathologic diagnosis showed high grade, poorly differentiated neuroendocrine carcinoma, small  cell type. Chest CT confirmed a left lower lobe nodule and significant mediastinal adenopathy. She was consulted by Dr. Marin Olp. Ms. Vandervoort was seen by radiation oncology, recommending WBR once post op status improved. For chemo options carboplatin/VP-16 was entertained for likely microscopic metastatic disease treatment. On 4/2 she was found to have CSF leak from bottom of incision that needed revision with drain put in place.She was transferred to [facility] and the procedure took place on 4/3. Drainage began to decrease and eventually was removed. An LP was peformed with resolution.She was transferred back to [facility] under Dr. Antonieta Pert service,  and whole brain XRT was initiated on 12/03/13. Rehab medicine consulted on the patient, recommending admission to the Rehab Unit for continuation of care prior to being discharged home, so that issues such as "balance, endurance, locomotion, strength, transferring, bowel/bladder control, bathing, dressing, feeding, grooming, toileting, cognition, speech, swallowing and psychosocial support" can be provided.She will continue radiation while at Rehab.  Latest CT scan does not show progression of disease in her body. A Port A Cath was placed on 10/02/92 without complications.Her appetite remains good. She denies any  nausea or vomiting.No headache. Some confusion still noted. Urinating without difficulty. She did have some physical therapy yesterday. She is getting occupational therapy. Speech therapy  is also helping with swallowing.Labs are unremarkable. She is to be discharged in stable condition.   Physical Exam at Discharge: BP 108/65  Pulse 68  Temp(Src) 98 F (36.7 C) (Oral)  Resp 12  Ht 5\' 6"  (1.676 m)  Wt 119 lb 14.9 oz (54.4 kg)  BMI 19.37 kg/m2  SpO63 31% 63 year old in no acute distress, conversant, alert and oriented to time, place and date.  General well-developed, very thin. HEENT:Right area of swelling in the right parietal region, without erythema .Sclera anicteric.Oral cavity without thrush or lesions. Neck: supple.No thyromegaly,no cervical or supraclavicular adenopathy  Lungs:clear to auscultation. No wheezing,rhonchi or rales. Cardiac:regular rate and rhythm,no murmur,rubs or gallops Abdomen: soft nontender,bowel sounds x4. Nohepatosplenomegaly Extremities:no clubbing cyanosis. Distal leg swelling in the right parietal region. This is nontender,not worse from prior exams No petechial rash Neuro:No focal or motor deficits.She does have a slight disorientation but this is pleasant in nature. Her speech has improved.  Diet:  regular  Activity:  As tolerated with assistance at the Rehab Unit  Condition at Discharge:   Improved  Signed: Sharene Butters, PA-C/Peter Marin Olp, MD  ADDENDUM: I agree with the above assessment by Judson Roch. She's done very well. She now be transferred to the rehabilitation unit. She will continue her radiation therapy. We will initiate chemotherapy as an outpatient.  I'm sure neurosurgery will follow her as necessary.  Will have to make sure her blood sugars and out of control.  We will follow along while she is over in the rehabilitation unit.  Pete E.   12/06/2013, 12:10 PM

## 2013-12-06 NOTE — Care Management Note (Signed)
Patient scheduled to transfer to CIR at [facility]. MD transfer form signed an palced in [place]. CIR to contact [place] with room number for report. RN to contact [facility] for transport.     Venita Lick Jonothan Heberle,MSN,RN [phone]

## 2013-12-06 NOTE — H&P (Signed)
Meredith Staggers, MD Physician [facility] Rehabilitation H&P Service date: 12/06/2013 1:34 PM  Related encounter: Admission (Discharged) from 11/11/2013 in [facility]    Physical Medicine and Rehabilitation Admission H&P  CC: Impaired balance with ataxia, dysphagia as well as cognitive deficits.  HPI: Kijuana Ruppel is a 63 y.o. female with history of anxiety disorder otherwise in good health. She was admitted via [facility] on 11/11/13 past syncopal event and sustained a fall with injury to left frontal region and obtunded at admission. CT brain done demonstrates at least 2 large right-sided lesions one frontally and one parietal occipital area with 2 mm of shift with subfalcine herniation. Husband reported 6 week history of confusion with disorientation and falls. MRI brain with 4.4 x 6 cm right temporal parietal occipital mass with extensive vasogenic edema and high right frontal lobe mass 2.3 X 2.3 cm likely metastatic disease or possibly primary brain tumors as well as 10 mm Right to left shift with hydrocephalus. Patient taken to OR for Right parietal occipital crani for resection of tumor on 11/12/13 by Dr. Ellene Route. 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 CT lungs with LLL mass with subcarinal adenopathy. She was extubated without difficulty on 11/15/13.  She was evaluated by Dr. Marin Olp and chemo recommended for likely micrometastatic disease and XRT to brain recommended by Dr. Isidore Moos. She developed CSF leak requiring revision of incision with subgleal drain on 11/22/13. Mentation has fluctuated and she was placed on Vancomycin for HCAP. Drain removed but patient with continued subgaleal fluid collections therefore LP done with resolution. MBS done on 11/25/13 showing silent aspiration of thin liquids and she was placed on D3, nectar liquids. She was transferred back to [facility] for whole brain XRT on 12/03/13 and has  completed 4/10 treatments. Single lumen Power Port placed on 12/05/13 by IVR and OK to shower within 24 hours. Therapies initiated and patient with impaired balance with ataxia, impulsivity,poor attention as well as dysphagia.  ROS    Past Medical History    Diagnosis  Date    .  Anxiety       takes lexapro       Past Surgical History    Procedure  Laterality  Date    .  Appendectomy      .  Colon surgery        polyps removed    .  Craniotomy  Right  11/12/2013      Procedure: RIGHT PARIETAL CRANIOTOMY; Surgeon: Kristeen Miss, MD; Location: [facility]; Service: Neurosurgery; Laterality: Right;    .  Craniotomy  Right  11/22/2013      Procedure: Revision of Right Parietal occipital wound due to CSF Leak; Surgeon: Kristeen Miss, MD; Location: [place] [facility]; Service: Neurosurgery; Laterality: Right; Revision of Right Parietal occipital wound due to CSF Leak     History reviewed. No pertinent family history.  Social History: Married. Per reports that she has been smoking--1 1/2 PPD?Marland Kitchen She does not have any smokeless tobacco history on file. Her alcohol and drug histories are not on file.   Allergies: No Known Allergies    Medications Prior to Admission    Medication  Sig  Dispense  Refill    .  aspirin EC 81 MG tablet  Take 81 mg by mouth daily.      Marland Kitchen  atorvastatin (LIPITOR) 10 MG tablet  Take 10 mg by mouth every evening.      Marland Kitchen  cyanocobalamin 500 MCG tablet  Take 500 mcg by mouth daily.      Marland Kitchen  escitalopram (LEXAPRO) 10 MG tablet  Take 10 mg by mouth every morning.       Home:  Home Living  Family/patient expects to be discharged to:: Private residence  Living Arrangements: Spouse/significant other  Available Help at Discharge: Family;Available 24 hours/day  Type of Home: House  Home Access: Stairs to enter  [employer] of Steps: 3  Entrance Stairs-Rails: None  Home Layout: Two level;Able to live on main level with bedroom/bathroom  Alternate Level Stairs-Number of Steps:  one flight  Alternate Level Stairs-Rails: Right  Home Equipment: Walker - 2 wheels;Wheelchair - manual  Additional Comments: pt's husband provided home living info and states he will be available 24/7 at home.  Lives With: Spouse  Functional History:  Prior Function  Level of Independence: Independent  Functional Status:  Mobility:  Bed Mobility  Overal bed mobility: Needs Assistance  Bed Mobility: Supine to Sit  Supine to sit: Min assist  General bed mobility comments: impulsive. Poor gross motor control. Increased assist to complete scooting to EOB. Close supervision when sitting EOB. HIGH FALL RISK  Transfers  Overall transfer level: Needs assistance  Equipment used: 2 person hand held assist  Transfers: Sit to/from [facility]  Sit to Stand: +2 physical assistance;+2 safety/equipment;Max assist;Mod assist  Stand pivot transfers: +2 physical assistance;Mod assist;Max assist  General transfer comment: difficulty with self sit to stand and stand to sit as B knees buckle and B UE's require hand over hand cueing for proper placement. Assisted OOB to [facility].  Ambulation/Gait  Ambulation/Gait assistance: +2 safety/equipment;+2 physical assistance;Max assist  Ambulation Distance (Feet): 65 Feet  Assistive device: Right platform walker;Left platform walker (EVA walker)  Gait Pattern/deviations: Step-to pattern;Step-through pattern;Ataxic;Narrow base of support  Gait velocity: decreased  General Gait Details: Used EVA walker and MAX posterior trunk support to prevent pt from sitting too soon and assist with weight shift to increase steppage. Spouse assisted by walking in front of pt to encourage increased amb distance. Pt more able to self rise trunk extension.   ADL:   Cognition:  Cognition  Overall Cognitive Status: Impaired/Different from baseline  Arousal/Alertness: Awake/alert  Orientation Level: Oriented to person;Disoriented to place;Disoriented to time;Disoriented  to situation  Attention: Focused  Focused Attention: Impaired  Focused Attention Impairment: Verbal basic;Functional basic  Memory: Impaired  Memory Impairment: Storage deficit;Decreased recall of new information;Decreased short term memory  Decreased Short Term Memory: Verbal basic;Functional basic  Awareness: Impaired  Awareness Impairment: Intellectual impairment;Emergent impairment;Anticipatory impairment  Problem Solving: Impaired  Problem Solving Impairment: Verbal basic;Functional basic  Behaviors: Impulsive;Perseveration  Safety/Judgment: Impaired  Cognition  Arousal/Alertness: Awake/alert  Behavior During Therapy: WFL for tasks assessed/performed  Overall Cognitive Status: Impaired/Different from baseline  Area of Impairment: Orientation;Attention;Memory;Following commands;Safety/judgement;Awareness;Problem solving  Orientation Level: Disoriented to;Place;Time;Situation  Current Attention Level: Focused (internally distracted)  Memory: Decreased short-term memory;Decreased recall of precautions  Following Commands: Follows one step commands inconsistently  Safety/Judgement: Decreased awareness of safety;Decreased awareness of deficits  Awareness: Intellectual  Problem Solving: Slow processing;Difficulty sequencing;Requires verbal cues;Requires tactile cues  General Comments: pt able to read calendar on wall for orientation to time. Pt remains impulsive  Physical Exam:  Blood pressure 108/65, pulse 68, temperature 98 F (36.7 C), temperature source Oral, resp. rate 12, height 5' 6"  (1.676 m), weight 54.4 kg (119 lb 14.9 oz), SpO2 100.00%.  Physical Exam  Constitutional: No distress.  Frail appearing, alert  HENT:  Head: Normocephalic and atraumatic.  Right Ear: External ear normal.  Left Ear: External ear normal.  Eyes: Conjunctivae and EOM are normal. Pupils are equal, round, and reactive to light.  Neck: No JVD present. No tracheal deviation present. No thyromegaly  present.  Cardiovascular: Normal rate and regular rhythm. Exam reveals friction rub. Exam reveals no gallop.  No murmur heard.  Respiratory: No respiratory distress. She has no wheezes. She has no rales. She exhibits no tenderness.  GI: She exhibits no distension. There is no tenderness. There is no rebound.  Lymphadenopathy:  She has no cervical adenopathy.  Neurological:  Pt alert. Remains very restless and distracted. With repetition and cueing can occasionally respond appropriately to simple direction. After 5 attempts and cueing by 3 people she read the words on a sign in the room. Tends to perseverate on one activity. Makes little eye contact. Language of confusion. Likely receptive and expressive language deficits as well. When able to focus enough moves all 4 limbs. Senses pain. CN grossly intact although there is some questions regarding her visual acuity.  Skin: Skin is warm.  Psychiatric:  Restless, distracted   Constitutional: No distress.  Thing/frail appearing  Neck: No JVD present. No tracheal deviation present. No thyromegaly present.  Cardiovascular: Normal rate.  Respiratory: Effort normal.  GI: She exhibits no distension. There is no tenderness.  Neurological:  Pt very distracted confused. Speech dysarthric at times. Left HH. Moves all 4's fairly easily. Very impulsive and needs constant redirection  Skin:  Scalp incision/fullness noted on right    Results for orders placed during the hospital encounter of 11/11/13 (from the past 48 hour(s))    CBC Status: Abnormal     Collection Time     12/05/13 3:30 AM    Result  Value  Ref Range     WBC  13.5 (*)  4.0 - 10.5 K/uL     RBC  3.27 (*)  3.87 - 5.11 MIL/uL     Hemoglobin  10.6 (*)  12.0 - 15.0 g/dL     HCT  31.2 (*)  36.0 - 46.0 %     MCV  95.4  78.0 - 100.0 fL     MCH  32.4  26.0 - 34.0 pg     MCHC  34.0  30.0 - 36.0 g/dL     RDW  14.3  11.5 - 15.5 %     Platelets  186  150 - 400 K/uL    PROTIME-INR Status: None      Collection Time     12/05/13 3:30 AM    Result  Value  Ref Range     Prothrombin Time  12.9  11.6 - 15.2 seconds     INR  0.99  0.00 - 1.49    APTT Status: None     Collection Time     12/05/13 3:30 AM    Result  Value  Ref Range     aPTT  24  24 - 37 seconds    GLUCOSE, CAPILLARY Status: Abnormal     Collection Time     12/05/13 3:48 AM    Result  Value  Ref Range     Glucose-Capillary  121 (*)  70 - 99 mg/dL     Comment 1  Notify RN     SURGICAL PCR SCREEN Status: None     Collection Time     12/05/13 6:09 AM    Result  Value  Ref Range  MRSA, PCR  NEGATIVE  NEGATIVE     Staphylococcus aureus  NEGATIVE  NEGATIVE     Comment:       The Xpert SA Assay (FDA      approved for NASAL specimens      in patients over 80 years of age),      is one component of      a comprehensive surveillance      program. Test performance has      been validated by [employer] for patients greater      than or equal to 59 year old.      It is not intended      to diagnose infection nor to      guide or monitor treatment.    GLUCOSE, CAPILLARY Status: Abnormal     Collection Time     12/05/13 7:33 AM    Result  Value  Ref Range     Glucose-Capillary  103 (*)  70 - 99 mg/dL     Comment 1  Documented in Chart      Comment 2  Notify RN     GLUCOSE, CAPILLARY Status: Abnormal     Collection Time     12/05/13 11:53 AM    Result  Value  Ref Range     Glucose-Capillary  106 (*)  70 - 99 mg/dL     Comment 1  Documented in Chart      Comment 2  Notify RN     GLUCOSE, CAPILLARY Status: Abnormal     Collection Time     12/05/13 5:20 PM    Result  Value  Ref Range     Glucose-Capillary  114 (*)  70 - 99 mg/dL     Comment 1  Documented in Chart      Comment 2  Notify RN     GLUCOSE, CAPILLARY Status: Abnormal     Collection Time     12/05/13 8:34 PM    Result  Value  Ref Range     Glucose-Capillary  164 (*)  70 - 99 mg/dL     Comment 1  Notify RN     GLUCOSE, CAPILLARY Status:  Abnormal     Collection Time     12/06/13 12:11 AM    Result  Value  Ref Range     Glucose-Capillary  143 (*)  70 - 99 mg/dL     Comment 1  Notify RN     BASIC METABOLIC PANEL Status: Abnormal     Collection Time     12/06/13 3:45 AM    Result  Value  Ref Range     Sodium  135 (*)  137 - 147 mEq/L     Potassium  4.6  3.7 - 5.3 mEq/L     Chloride  98  96 - 112 mEq/L     CO2  29  19 - 32 mEq/L     Glucose, Bld  101 (*)  70 - 99 mg/dL     BUN  31 (*)  6 - 23 mg/dL     Creatinine, Ser  0.55  0.50 - 1.10 mg/dL     Calcium  7.9 (*)  8.4 - 10.5 mg/dL     GFR calc non Af Amer  >90  >90 mL/min     GFR calc Af Amer  >90  >90 mL/min     Comment:  (NOTE)  The eGFR has been calculated using the CKD EPI equation.      This calculation has not been validated in all clinical situations.      eGFR's persistently <90 mL/min signify possible Chronic Kidney      Disease.    CBC Status: Abnormal     Collection Time     12/06/13 3:45 AM    Result  Value  Ref Range     WBC  10.1  4.0 - 10.5 K/uL     RBC  3.30 (*)  3.87 - 5.11 MIL/uL     Hemoglobin  10.5 (*)  12.0 - 15.0 g/dL     HCT  31.5 (*)  36.0 - 46.0 %     MCV  95.5  78.0 - 100.0 fL     MCH  31.8  26.0 - 34.0 pg     MCHC  33.3  30.0 - 36.0 g/dL     RDW  14.1  11.5 - 15.5 %     Platelets  188  150 - 400 K/uL    GLUCOSE, CAPILLARY Status: None     Collection Time     12/06/13 4:30 AM    Result  Value  Ref Range     Glucose-Capillary  98  70 - 99 mg/dL    GLUCOSE, CAPILLARY Status: Abnormal     Collection Time     12/06/13 7:16 AM    Result  Value  Ref Range     Glucose-Capillary  113 (*)  70 - 99 mg/dL     Comment 1  Documented in Chart      Comment 2  Notify RN     GLUCOSE, CAPILLARY Status: Abnormal     Collection Time     12/06/13 11:45 AM    Result  Value  Ref Range     Glucose-Capillary  168 (*)  70 - 99 mg/dL     Comment 1  Documented in Chart      Comment 2  Notify RN     Post Admission Physician Evaluation:   1. 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 2. Patient is admitted to receive collaborative, interdisciplinary care between the physiatrist, rehab nursing staff, and therapy team. 3. Patient's level of medical complexity and substantial therapy needs in context of that medical necessity cannot be provided at a lesser intensity of care such as a SNF. 4. Patient has experienced substantial functional loss from his/her baseline which was documented above under the "Functional History" and "Functional Status" headings. Judging by the patient's diagnosis, physical exam, and functional history, the patient has potential for functional progress which will result in measurable gains while on inpatient rehab. These gains will be of substantial and practical use upon discharge in facilitating mobility and self-care at the household level. 5. Physiatrist will provide 24 hour management of medical needs as well as oversight of the therapy plan/treatment and provide guidance as appropriate regarding the interaction of the two. 6. 24 hour rehab nursing will assist with bladder management, bowel management, safety, skin/wound care, disease management, medication administration, pain management and patient education and help integrate therapy concepts, techniques,education, etc. 7. PT will assess and treat for/with: Lower extremity strength, range of motion, stamina, balance, functional mobility, safety, adaptive techniques and equipment, NMR, cognitive behavioral and cognitive perceptual awareness, language, family ed, environmental modifcation. Goals are: supervision to min assist. 8. OT will assess and treat for/with: ADL's, functional mobility, safety, upper extremity strength,  adaptive techniques and equipment, NMR, family education, cognitive perceptual and behavioral awareness, environmental mod. Goals are: supervision to min assist. 9. SLP will assess and treat for/with: speech, cognition,  communication, attention/awareness/education. Goals are: min assist. 10. Case Management and Social Worker will assess and treat for psychological issues and discharge planning. 11. Team conference will be held weekly to assess progress toward goals and to determine barriers to discharge. 12. Patient will receive at least 3 hours of therapy per day at least 5 days per week. 13. ELOS: 9-12 days  14. Prognosis: good   Medical Problem List and Plan:  Right temporal occipital metastatic tumor (SCLC)--  -XRT ongoing--?duration of therapy--4th out of 14 treatments today  -steroid taper--decadron 1. DVT Prophylaxis/Anticoagulation: Pharmaceutical: Lovenox  2. Pain Management: tylenol, hydrocodone 3. Mood: Resume Lexapro. Will monitor for changes as mentation improves. LCSW to follow for evaluation and support. Family will need a lot of encouragement and support as well given the diagnosis and her clinical picture.  4. Neuropsych: This patient is not capable of making decisions on her own behalf.  5. FEN/malnutrition---D3 Nectars---will need cueing for adequate intake due to cognition 6. Sz prophylaxis---keppra   Meredith Staggers, MD, [place]

## 2013-12-06 NOTE — H&P (Signed)
Physical Medicine and Rehabilitation Admission H&P   CC: Impaired balance with ataxia, dysphagia as well as cognitive deficits.   HPI:   Katie [name] is a 63 y.o. female with history of anxiety disorder otherwise in good health. She was admitted via [place] on 11/11/13 past syncopal event and sustained a fall with injury to left frontal region and obtunded at admission. CT brain done demonstrates at least 2 large right-sided lesions one frontally and one parietal occipital area with 2 mm of shift with subfalcine herniation. Husband reported 6 week history of confusion with disorientation and falls. MRI brain with 4.4 x 6 cm right temporal parietal occipital mass with extensive vasogenic edema and high right frontal lobe mass 2.3 X 2.3 cm likely metastatic disease or possibly primary brain tumors as well as 10 mm Right to left shift with hydrocephalus. Patient taken to OR for Right parietal occipital crani for resection of tumor on 11/12/13 by Dr. Ellene Route. 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 CT lungs with LLL mass with subcarinal adenopathy. She was extubated without difficulty on 11/15/13.  She was evaluated by Dr. Marin Olp and chemo recommended for likely micrometastatic disease and XRT to brain recommended by Dr. Isidore Moos.  She developed CSF leak requiring revision of incision with subgleal drain on 11/22/13. Mentation has fluctuated and she was placed on Vancomycin for HCAP. Drain removed but patient with continued subgaleal fluid collections therefore LP done with resolution. MBS done on 11/25/13 showing silent aspiration of thin liquids and she was placed on D3, nectar liquids. She was transferred back to [facility] for whole brain XRT on 12/03/13 and has completed 4/10 treatments. Single lumen Power Port placed on 12/05/13 by IVR and OK to shower within 24 hours. Therapies initiated and patient with impaired balance with ataxia, impulsivity,poor attention as well as  dysphagia.       ROS  Past Medical History  Diagnosis Date  . Anxiety     takes lexapro    Past Surgical History  Procedure Laterality Date  . Appendectomy    . Colon surgery      polyps  removed  . Craniotomy Right 11/12/2013    Procedure: RIGHT PARIETAL CRANIOTOMY;  Surgeon: Kristeen Miss, MD;  Location: [place] [facility];  Service: Neurosurgery;  Laterality: Right;  . Craniotomy Right 11/22/2013    Procedure: Revision of Right Parietal occipital wound due to CSF Leak;  Surgeon: Kristeen Miss, MD;  Location: [place] [facility];  Service: Neurosurgery;  Laterality: Right;  Revision of Right Parietal occipital wound due to CSF Leak    History reviewed. No pertinent family history.   Social History: Married. Per reports that she has been smoking--1 1/2 PPD?Marland Kitchen She does not have any smokeless tobacco history on file. Her alcohol and drug histories are not on file.    Allergies: No Known Allergies   Medications Prior to Admission  Medication Sig Dispense Refill  . aspirin EC 81 MG tablet Take 81 mg by mouth daily.      Marland Kitchen atorvastatin (LIPITOR) 10 MG tablet Take 10 mg by mouth every evening.      . cyanocobalamin 500 MCG tablet Take 500 mcg by mouth daily.      Marland Kitchen escitalopram (LEXAPRO) 10 MG tablet Take 10 mg by mouth every morning.        Home: Home Living Family/patient expects to be discharged to:: Private residence Living Arrangements: Spouse/significant other Available Help at Discharge: Family;Available 24 hours/day Type of Home:  House Home Access: Stairs to enter [employer] of Steps: 3 Entrance Stairs-Rails: None Home Layout: Two level;Able to live on main level with bedroom/bathroom Alternate Level Stairs-Number of Steps: one flight Alternate Level Stairs-Rails: Right Home Equipment: Walker - 2 wheels;Wheelchair - manual Additional Comments: pt's husband provided home living info and states he will be available 24/7 at home.   Lives With: Spouse   Functional  History: Prior Function Level of Independence: Independent  Functional Status:  Mobility: Bed Mobility Overal bed mobility: Needs Assistance Bed Mobility: Supine to Sit Supine to sit: Min assist General bed mobility comments: impulsive.  Poor gross motor control.  Increased assist to complete scooting to EOB.  Close supervision when sitting EOB.  HIGH FALL RISK Transfers Overall transfer level: Needs assistance Equipment used: 2 person hand held assist Transfers: Sit to/from [facility] Sit to Stand: +2 physical assistance;+2 safety/equipment;Max assist;Mod assist Stand pivot transfers: +2 physical assistance;Mod assist;Max assist General transfer comment: difficulty with self sit to stand and stand to sit as B knees buckle and B UE's require hand over hand cueing for proper placement.  Assisted OOB to [facility].   Ambulation/Gait Ambulation/Gait assistance: +2 safety/equipment;+2 physical assistance;Max assist Ambulation Distance (Feet): 65 Feet Assistive device: Right platform walker;Left platform walker (EVA walker) Gait Pattern/deviations: Step-to pattern;Step-through pattern;Ataxic;Narrow base of support Gait velocity: decreased General Gait Details: Used EVA walker and MAX posterior trunk support to prevent pt from sitting too soon and assist with weight shift to increase steppage.  Spouse assisted by walking in front of pt to encourage increased amb distance.  Pt more able to self rise trunk extension.      ADL:    Cognition: Cognition Overall Cognitive Status: Impaired/Different from baseline Arousal/Alertness: Awake/alert Orientation Level: Oriented to person;Disoriented to place;Disoriented to time;Disoriented to situation Attention: Focused Focused Attention: Impaired Focused Attention Impairment: Verbal basic;Functional basic Memory: Impaired Memory Impairment: Storage deficit;Decreased recall of new information;Decreased short term memory Decreased Short  Term Memory: Verbal basic;Functional basic Awareness: Impaired Awareness Impairment: Intellectual impairment;Emergent impairment;Anticipatory impairment Problem Solving: Impaired Problem Solving Impairment: Verbal basic;Functional basic Behaviors: Impulsive;Perseveration Safety/Judgment: Impaired Cognition Arousal/Alertness: Awake/alert Behavior During Therapy: WFL for tasks assessed/performed Overall Cognitive Status: Impaired/Different from baseline Area of Impairment: Orientation;Attention;Memory;Following commands;Safety/judgement;Awareness;Problem solving Orientation Level: Disoriented to;Place;Time;Situation Current Attention Level: Focused (internally distracted) Memory: Decreased short-term memory;Decreased recall of precautions Following Commands: Follows one step commands inconsistently Safety/Judgement: Decreased awareness of safety;Decreased awareness of deficits Awareness: Intellectual Problem Solving: Slow processing;Difficulty sequencing;Requires verbal cues;Requires tactile cues General Comments: pt able to read calendar on wall for orientation to time.  Pt remains impulsive  Physical Exam: Blood pressure 108/65, pulse 68, temperature 98 F (36.7 C), temperature source Oral, resp. rate 12, height 5' 6"  (1.676 m), weight 54.4 kg (119 lb 14.9 oz), SpO2 100.00%. Physical Exam  Constitutional: No distress.  Frail appearing, alert  HENT:  Head: Normocephalic and atraumatic.  Right Ear: External ear normal.  Left Ear: External ear normal.  Eyes: Conjunctivae and EOM are normal. Pupils are equal, round, and reactive to light.  Neck: No JVD present. No tracheal deviation present. No thyromegaly present.  Cardiovascular: Normal rate and regular rhythm.  Exam reveals friction rub. Exam reveals no gallop.   No murmur heard. Respiratory: No respiratory distress. She has no wheezes. She has no rales. She exhibits no tenderness.  GI: She exhibits no distension. There is no  tenderness. There is no rebound.  Lymphadenopathy:    She has no cervical adenopathy.  Neurological:  Pt  alert. Remains very restless and distracted. With repetition and cueing can occasionally respond appropriately to simple direction. After 5 attempts and cueing by 3 people she read the words on a sign in the room. Tends to perseverate on one activity. Makes little eye contact. Language of confusion. Likely receptive and expressive language deficits as well. When able to focus enough moves all 4 limbs. Senses pain. CN grossly intact although there is some questions regarding her visual acuity.  Skin: Skin is warm.  Psychiatric:  Restless, distracted    Constitutional: No distress.  Thing/frail appearing  Neck: No JVD present. No tracheal deviation present. No thyromegaly present.  Cardiovascular: Normal rate.  Respiratory: Effort normal.  GI: She exhibits no distension. There is no tenderness.  Neurological:  Pt very distracted confused. Speech dysarthric at times. Left HH. Moves all 4's fairly easily. Very impulsive and needs constant redirection  Skin:  Scalp incision/fullness noted on right   Results for orders placed during the hospital encounter of 11/11/13 (from the past 48 hour(s))  CBC     Status: Abnormal   Collection Time    12/05/13  3:30 AM      Result Value Ref Range   WBC 13.5 (*) 4.0 - 10.5 K/uL   RBC 3.27 (*) 3.87 - 5.11 MIL/uL   Hemoglobin 10.6 (*) 12.0 - 15.0 g/dL   HCT 31.2 (*) 36.0 - 46.0 %   MCV 95.4  78.0 - 100.0 fL   MCH 32.4  26.0 - 34.0 pg   MCHC 34.0  30.0 - 36.0 g/dL   RDW 14.3  11.5 - 15.5 %   Platelets 186  150 - 400 K/uL  PROTIME-INR     Status: None   Collection Time    12/05/13  3:30 AM      Result Value Ref Range   Prothrombin Time 12.9  11.6 - 15.2 seconds   INR 0.99  0.00 - 1.49  APTT     Status: None   Collection Time    12/05/13  3:30 AM      Result Value Ref Range   aPTT 24  24 - 37 seconds  GLUCOSE, CAPILLARY     Status: Abnormal     Collection Time    12/05/13  3:48 AM      Result Value Ref Range   Glucose-Capillary 121 (*) 70 - 99 mg/dL   Comment 1 Notify RN    SURGICAL PCR SCREEN     Status: None   Collection Time    12/05/13  6:09 AM      Result Value Ref Range   MRSA, PCR NEGATIVE  NEGATIVE   Staphylococcus aureus NEGATIVE  NEGATIVE   Comment:            The Xpert SA Assay (FDA     approved for NASAL specimens     in patients over 53 years of age),     is one component of     a comprehensive surveillance     program.  Test performance has     been validated by [employer] for patients greater     than or equal to 49 year old.     It is not intended     to diagnose infection nor to     guide or monitor treatment.  GLUCOSE, CAPILLARY     Status: Abnormal   Collection Time    12/05/13  7:33 AM  Result Value Ref Range   Glucose-Capillary 103 (*) 70 - 99 mg/dL   Comment 1 Documented in Chart     Comment 2 Notify RN    GLUCOSE, CAPILLARY     Status: Abnormal   Collection Time    12/05/13 11:53 AM      Result Value Ref Range   Glucose-Capillary 106 (*) 70 - 99 mg/dL   Comment 1 Documented in Chart     Comment 2 Notify RN    GLUCOSE, CAPILLARY     Status: Abnormal   Collection Time    12/05/13  5:20 PM      Result Value Ref Range   Glucose-Capillary 114 (*) 70 - 99 mg/dL   Comment 1 Documented in Chart     Comment 2 Notify RN    GLUCOSE, CAPILLARY     Status: Abnormal   Collection Time    12/05/13  8:34 PM      Result Value Ref Range   Glucose-Capillary 164 (*) 70 - 99 mg/dL   Comment 1 Notify RN    GLUCOSE, CAPILLARY     Status: Abnormal   Collection Time    12/06/13 12:11 AM      Result Value Ref Range   Glucose-Capillary 143 (*) 70 - 99 mg/dL   Comment 1 Notify RN    BASIC METABOLIC PANEL     Status: Abnormal   Collection Time    12/06/13  3:45 AM      Result Value Ref Range   Sodium 135 (*) 137 - 147 mEq/L   Potassium 4.6  3.7 - 5.3 mEq/L   Chloride 98  96 - 112 mEq/L    CO2 29  19 - 32 mEq/L   Glucose, Bld 101 (*) 70 - 99 mg/dL   BUN 31 (*) 6 - 23 mg/dL   Creatinine, Ser 0.55  0.50 - 1.10 mg/dL   Calcium 7.9 (*) 8.4 - 10.5 mg/dL   GFR calc non Af Amer >90  >90 mL/min   GFR calc Af Amer >90  >90 mL/min   Comment: (NOTE)     The eGFR has been calculated using the CKD EPI equation.     This calculation has not been validated in all clinical situations.     eGFR's persistently <90 mL/min signify possible Chronic Kidney     Disease.  CBC     Status: Abnormal   Collection Time    12/06/13  3:45 AM      Result Value Ref Range   WBC 10.1  4.0 - 10.5 K/uL   RBC 3.30 (*) 3.87 - 5.11 MIL/uL   Hemoglobin 10.5 (*) 12.0 - 15.0 g/dL   HCT 31.5 (*) 36.0 - 46.0 %   MCV 95.5  78.0 - 100.0 fL   MCH 31.8  26.0 - 34.0 pg   MCHC 33.3  30.0 - 36.0 g/dL   RDW 14.1  11.5 - 15.5 %   Platelets 188  150 - 400 K/uL  GLUCOSE, CAPILLARY     Status: None   Collection Time    12/06/13  4:30 AM      Result Value Ref Range   Glucose-Capillary 98  70 - 99 mg/dL  GLUCOSE, CAPILLARY     Status: Abnormal   Collection Time    12/06/13  7:16 AM      Result Value Ref Range   Glucose-Capillary 113 (*) 70 - 99 mg/dL   Comment 1 Documented in Chart  Comment 2 Notify RN    GLUCOSE, CAPILLARY     Status: Abnormal   Collection Time    12/06/13 11:45 AM      Result Value Ref Range   Glucose-Capillary 168 (*) 70 - 99 mg/dL   Comment 1 Documented in Chart     Comment 2 Notify RN      Post Admission Physician Evaluation: 1. 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 2. Patient is admitted to receive collaborative, interdisciplinary care between the physiatrist, rehab nursing staff, and therapy team. 3. Patient's level of medical complexity and substantial therapy needs in context of that medical necessity cannot be provided at a lesser intensity of care such as a SNF. 4. Patient has experienced substantial functional loss from his/her baseline which was  documented above under the "Functional History" and "Functional Status" headings.  Judging by the patient's diagnosis, physical exam, and functional history, the patient has potential for functional progress which will result in measurable gains while on inpatient rehab.  These gains will be of substantial and practical use upon discharge  in facilitating mobility and self-care at the household level. 5. Physiatrist will provide 24 hour management of medical needs as well as oversight of the therapy plan/treatment and provide guidance as appropriate regarding the interaction of the two. 6. 24 hour rehab nursing will assist with bladder management, bowel management, safety, skin/wound care, disease management, medication administration, pain management and patient education  and help integrate therapy concepts, techniques,education, etc. 7. PT will assess and treat for/with: Lower extremity strength, range of motion, stamina, balance, functional mobility, safety, adaptive techniques and equipment, NMR, cognitive behavioral and cognitive perceptual awareness, language, family ed, environmental modifcation.   Goals are: supervision to min assist. 8. OT will assess and treat for/with: ADL's, functional mobility, safety, upper extremity strength, adaptive techniques and equipment, NMR, family education, cognitive perceptual and behavioral awareness, environmental mod.   Goals are: supervision to min assist. 9. SLP will assess and treat for/with: speech, cognition, communication, attention/awareness/education.  Goals are: min assist. 10. Case Management and Social Worker will assess and treat for psychological issues and discharge planning. 11. Team conference will be held weekly to assess progress toward goals and to determine barriers to discharge. 12. Patient will receive at least 3 hours of therapy per day at least 5 days per week. 13. ELOS: 9-12 days       14. Prognosis:  good   Medical Problem List and  Plan: Right temporal occipital metastatic tumor (SCLC)--  -XRT ongoing--?duration of therapy--4th out of 14 treatments today  1. DVT Prophylaxis/Anticoagulation: Pharmaceutical: Lovenox 2. Pain Management:  3. Mood: Resume Lexapro. Will monitor for changes as mentation improves. LCSW to follow for evaluation and support. Family will need a lot of encouragement and support as well given the diagnosis and her clinical picture.  4. Neuropsych: This patient is not capable of making decisions on her own behalf. [place], MD, [facility]  12/06/2013

## 2013-12-07 ENCOUNTER — Inpatient Hospital Stay (HOSPITAL_COMMUNITY): Payer: BC Managed Care – PPO | Admitting: Physical Therapy

## 2013-12-07 ENCOUNTER — Inpatient Hospital Stay (HOSPITAL_COMMUNITY): Payer: BC Managed Care – PPO | Admitting: Speech Pathology

## 2013-12-07 ENCOUNTER — Inpatient Hospital Stay (HOSPITAL_COMMUNITY): Payer: BC Managed Care – PPO | Admitting: Occupational Therapy

## 2013-12-07 LAB — GLUCOSE, CAPILLARY
GLUCOSE-CAPILLARY: 149 mg/dL — AB (ref 70–99)
GLUCOSE-CAPILLARY: 118 mg/dL — AB (ref 70–99)
GLUCOSE-CAPILLARY: 138 mg/dL — AB (ref 70–99)
Glucose-Capillary: 143 mg/dL — ABNORMAL HIGH (ref 70–99)
Glucose-Capillary: 134 mg/dL — ABNORMAL HIGH (ref 70–99)

## 2013-12-07 NOTE — Evaluation (Signed)
Speech Language Pathology Assessment and Plan  Patient Details  Name: Katie [name] MRN: [id number] Date of Birth: 01-10-1951  SLP Diagnosis: Cognitive Impairments;Speech and Language deficits;Dysphagia  Rehab Potential: Good ELOS: 21-28 days    Today's Date: 12/07/2013 Time: 0805-0900 Time Calculation (min): 55 min  Problem List:  Patient Active Problem List   Diagnosis Date Noted  . Brain tumor 12/06/2013  . 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 Malnutrition of moderate degree 11/19/2013  . Small cell lung cancer 11/18/2013  . HTN (hypertension) 11/18/2013  . Steroid-induced hyperglycemia 11/18/2013  . Fall with head trauma 11/18/2013  . Underweight 11/18/2013  . Protein-calorie malnutrition, severe 11/18/2013  . 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 brain metastases 11/15/2013  . Acute respiratory failure 11/12/2013  . Altered mental status 11/12/2013  . Syncope secondary to brain metastasis 11/11/2013   Past Medical History:  Past Medical History  Diagnosis Date  . Anxiety     takes lexapro   Past Surgical History:  Past Surgical History  Procedure Laterality Date  . Appendectomy    . Colon surgery      polyps  removed  . Craniotomy Right 11/12/2013    Procedure: RIGHT PARIETAL CRANIOTOMY;  Surgeon: Kristeen Miss, MD;  Location: [place] [facility];  Service: Neurosurgery;  Laterality: Right;  . Craniotomy Right 11/22/2013    Procedure: Revision of Right Parietal occipital wound due to CSF Leak;  Surgeon: Kristeen Miss, MD;  Location: [place] [facility];  Service: Neurosurgery;  Laterality: Right;  Revision of Right Parietal occipital wound due to CSF Leak    Assessment / Plan / Recommendation Clinical Impression   Katie [name] is a 63 y.o. female with history of anxiety disorder otherwise in good health. She was admitted via [place] on 11/11/13 past syncopal event and sustained a fall with injury to left frontal region and obtunded at admission. CT brain done demonstrates at least 2 large  right-sided lesions one frontally and one parietal occipital area with 2 mm of shift with subfalcine herniation. Husband reported 6 week history of confusion with disorientation and falls. MRI brain with 4.4 x 6 cm right temporal parietal occipital mass with extensive vasogenic edema and high right frontal lobe mass 2.3 X 2.3 cm likely metastatic disease or possibly primary brain tumors as well as 10 mm Right to left shift with hydrocephalus. Patient taken to OR for Right parietal occipital crani for resection of tumor on 11/12/13 by Dr. Ellene Route. 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 CT lungs with LLL mass with subcarinal adenopathy. She was extubated without difficulty on 11/15/13. She was evaluated by Dr. Marin Olp and chemo recommended for likely micrometastatic disease and XRT to brain recommended by Dr. Isidore Moos. She developed CSF leak requiring revision of incision with subgleal drain on 11/22/13. Mentation has fluctuated and she was placed on Vancomycin for HCAP. Drain removed but patient with continued subgaleal fluid collections therefore LP done with resolution. MBS done on 11/25/13 showing silent aspiration of thin liquids and she was placed on Dys.3, nectar-thick liquids. She was transferred back to [facility] for whole brain XRT on 12/03/13 and has completed 4/10 treatments. Single lumen Power Port placed on 12/05/13 by IVR and OK to shower within 24 hours. Therapies initiated and patient with impaired balance with ataxia, impulsivity, poor attention as well as dysphagia.  Patient admitted to rehab 12/06/13.  Bedside swallow and cognitive-linguistic evaluations completed.  Patient demonstrates severe cognitive impairments which impact her communication abilities as well as  her dysphagia.  Cognition impairments are characterized by poor sustained attention which impacts all higher level cognition abilities.  Patient's verbal expression is characterized by language of  confusion.  Dysphagia per MBS is a sensori-motor based pharyngeal dysphagia, characterized by delayed swallow initiation and reduced laryngeal elevation, as well as inadequate laryngeal closure, resulting in silent aspiration of thin liquids.  Patient's impulsivity and perseveration further impacts her ability to safely complete basic self-care tasks.  As a result, patient requires skilled SLP services to address deficits and maximize safe, functional independence prior to discharge home with 24/7 family supervision.  Skilled Therapeutic Interventions          See BSE and SLE for full details.  Family educated on effective cuing strategies for full supervision during PO intake.   SLP Assessment  Patient will need skilled Speech Lanaguage Pathology Services during CIR admission    Recommendations  Diet Recommendations: Dysphagia 3 (Mechanical Soft);Nectar-thick liquid Liquid Administration via: Cup;No straw Medication Administration: Crushed with puree Supervision: Trained caregiver to feed patient;Full supervision/cueing for compensatory strategies Compensations: Slow rate;Small sips/bites Postural Changes and/or Swallow Maneuvers: Seated upright 90 degrees Oral Care Recommendations: Oral care BID Patient destination: Home Follow up Recommendations: Home Health SLP;24 hour supervision/assistance Equipment Recommended: None recommended by SLP    SLP Frequency 5 out of 7 days   SLP Treatment/Interventions Cognitive remediation/compensation;Cueing hierarchy;Dysphagia/aspiration precaution training;Environmental controls;Functional tasks;Internal/external aids;Patient/family education;Speech/Language facilitation;Therapeutic Activities    Pain Pain Assessment Pain Assessment: No/denies pain Prior Functioning Cognitive/Linguistic Baseline: Within functional limits Type of Home: House  Lives With: Spouse Available Help at Discharge: Family;Friend(s);Available 24 hours/day Vocation:  Retired  Industrial/product designer Term Goals: Week 1: SLP Short Term Goal 1 (Week 1): Patient will consume PO with Mod level verbal cues for pacing and portion control. SLP Short Term Goal 2 (Week 1): Patient will sustain attention to task for 1 minute with Mod verbal cues for redirection. SLP Short Term Goal 3 (Week 1): Patient will utilize external aids for orientation with Max assist.  SLP Short Term Goal 4 (Week 1): Patient will label 2 deficits with Max clinician cues.  SLP Short Term Goal 5 (Week 1): Patient will solve basic problems with Mod assist. SLP Short Term Goal 6 (Week 1): Patient maintain topic of conversation for 2 turns with Max clinician cues.  See FIM for current functional status Refer to Care Plan for Long Term Goals  Recommendations for other services: None  Discharge Criteria: Patient will be discharged from SLP if patient refuses treatment 3 consecutive times without medical reason, if treatment goals not met, if there is a change in medical status, if patient makes no progress towards goals or if patient is discharged from hospital.  The above assessment, treatment plan, treatment alternatives and goals were discussed and mutually agreed upon: by patient and by family  Carmelia Roller., [place] 12/07/2013, 4:50 PM

## 2013-12-07 NOTE — Progress Notes (Signed)
Physical Therapy Session Note  Patient Details  Name: Katie [name] MRN: [id number] Date of Birth: 02-Jan-1951  Today's Date: 12/07/2013 Time: 0352-4818 Time Calculation (min): 23 min  Short Term Goals: Week 1:  PT Short Term Goal 1 (Week 1): Pt will increase bed mobility to mod A.  PT Short Term Goal 2 (Week 1): Pt will increase transfer bed to chair, chair to bed to mod A.  PT Short Term Goal 3 (Week 1): Pt will increase ambulation with LRAD to mod A about 25 feet.  PT Short Term Goal 4 (Week 1): Pt will ascend/descend 2 stairs with B rails and max A.  PT Short Term Goal 5 (Week 1): Pt will propel w/c about 100 feet with B LEs and min A.   Skilled Therapeutic Interventions/Progress Updates:  Pt was seen bedside in the pm. Pt lethargic but arouses to stimuli. Pt transferred supine to edge of bed with head of bed elevated and mod to max A. Pt transferred edge of bed to w/c with max A. Attempted stairs x 2, pt able to stand at stairs with max to total A, but unwilling to attempt despite max encouragement. Pt returned to room. Pt transferred w/c to edge of bed with max to total A. Pt transferred edge of bed to supine with max A.   Therapy Documentation Precautions:  Precautions Precautions: Fall Precaution Comments: impulsive, decreased safety awareness Restrictions Weight Bearing Restrictions: No General: Pain: No c/o pain.   See FIM for current functional status  Therapy/Group: Individual Therapy  Dub Amis 12/07/2013, 4:29 PM

## 2013-12-07 NOTE — Progress Notes (Signed)
[place] PHYSICAL MEDICINE & REHABILITATION     PROGRESS NOTE    Subjective/Complaints: No specific issues although ROS is limited by patient's cog/lang issues. No problems reported by RN last night  Objective: Vital Signs: Blood pressure 119/68, pulse 68, temperature 97.5 F (36.4 C), temperature source Oral, resp. rate 18, height 5\' 7"  (1.702 m), weight 56.836 kg (125 lb 4.8 oz), SpO2 98.00%. 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  EXAM: IR ULTRASOUND GUIDANCE VASC ACCESS RIGHT; IR RIGHT FLOURO GUIDE CV LINE  Date: 12/05/2013  ANESTHESIA/SEDATION: Moderate (conscious) sedation was administered during this procedure. A total of 1 mg Versed and 25 mg Fentanyl were administered intravenously. The patient's vital signs were monitored continuously by radiology nursing throughout the course of the procedure.  Total sedation time: 20 minutes  FLUOROSCOPY TIME:  48 seconds  TECHNIQUE: The right neck and chest was prepped with chlorhexidine, and draped in the usual sterile fashion using maximum barrier technique (cap and mask, sterile gown, sterile gloves, large sterile sheet, hand hygiene and cutaneous antiseptic). Antibiotic prophylaxis was provided with 2g Ancef administered IV one hour prior to skin incision. Local anesthesia was attained by infiltration with 1% lidocaine with epinephrine.  Ultrasound demonstrated patency of the right internal jugular vein, and this was documented with an image. Under real-time ultrasound guidance, this vein was accessed with a 21 gauge micropuncture needle and image documentation was performed. A small dermatotomy was made at the access site with an 11 scalpel. A 0.018" wire was advanced into the SVC and the access needle exchanged for a 65F micropuncture vascular sheath. The 0.018" wire was then removed and a 0.035" wire advanced into the  IVC.  An appropriate location for the subcutaneous reservoir was selected below the clavicle and an incision was made through the skin and underlying soft tissues. The subcutaneous tissues were then dissected using a combination of blunt and sharp surgical technique and a pocket was formed. A single lumen power injectable portacatheter was then tunneled through the subcutaneous tissues from the pocket to the dermatotomy and the port reservoir placed within the subcutaneous pocket.  The venous access site was then serially dilated and a peel away vascular sheath placed over the wire. The wire was removed and the port catheter advanced into position under fluoroscopic guidance. The catheter tip is positioned in the upper right atrium. This was documented with a spot image. The portacatheter was then tested and found to flush and aspirate well. The port was flushed with saline followed by 100 units/mL heparinized saline.  The pocket was then closed in two layers using first subdermal inverted interrupted absorbable sutures followed by a running subcuticular suture. The epidermis was then sealed with Dermabond. The dermatotomy at the venous access site was also closed with a single inverted subdermal suture and the epidermis sealed with Dermabond.  COMPLICATIONS: None.  The patient tolerated the procedure well.  IMPRESSION: Successful placement of a right IJ approach Power Port with ultrasound and fluoroscopic guidance. The catheter is ready for use.  Signed,  Criselda Peaches, MD  [facility]  [facility] Radiology   Electronically Signed   By: Jacqulynn Cadet M.D.   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  EXAM:  IR ULTRASOUND GUIDANCE VASC ACCESS RIGHT; IR RIGHT FLOURO GUIDE CV LINE  Date: 12/05/2013  ANESTHESIA/SEDATION:  Moderate (conscious) sedation was administered during this procedure. A total of 1 mg Versed and 25 mg Fentanyl were administered intravenously. The patient's vital signs were monitored continuously by radiology nursing throughout the course of the procedure.  Total sedation time: 20 minutes  FLUOROSCOPY TIME:  48 seconds  TECHNIQUE: The right neck and chest was prepped with chlorhexidine, and draped in the usual sterile fashion using maximum barrier technique (cap and mask, sterile gown, sterile gloves, large sterile sheet, hand hygiene and cutaneous antiseptic). Antibiotic prophylaxis was provided with 2g Ancef administered IV one hour prior to skin incision. Local anesthesia was attained by infiltration with 1% lidocaine with epinephrine.  Ultrasound demonstrated patency of the right internal jugular vein, and this was documented with an image. Under real-time ultrasound guidance, this vein was accessed with a 21 gauge micropuncture needle and image documentation was performed. A small dermatotomy was made at the access site with an 11 scalpel. A 0.018" wire was advanced into the SVC and the access needle exchanged for a 46F micropuncture vascular sheath. The 0.018" wire was then removed and a 0.035" wire advanced into the IVC.  An appropriate location for the subcutaneous reservoir was selected below the clavicle and an incision was made through the skin and underlying soft tissues. The subcutaneous tissues were then dissected using a combination of blunt and sharp surgical technique and a pocket was formed. A single lumen power injectable portacatheter was then tunneled through the subcutaneous tissues from the pocket to the dermatotomy and the port reservoir placed within the subcutaneous pocket.  The venous access site was then serially dilated and a peel away vascular sheath placed over the wire. The wire was removed and the port catheter advanced into position under fluoroscopic guidance. The catheter tip is  positioned in the upper right atrium. This was documented with a spot image. The portacatheter was then tested and found to flush and aspirate well. The port was flushed with saline followed by 100 units/mL heparinized saline.  The pocket was then closed in two layers using first subdermal inverted interrupted absorbable sutures followed by a running subcuticular suture. The epidermis was then sealed with Dermabond. The dermatotomy at the venous access site was also closed with a single inverted subdermal suture and the epidermis sealed with Dermabond.  COMPLICATIONS: None.  The patient tolerated the procedure well.  IMPRESSION: Successful placement of a right IJ approach Power Port with ultrasound and fluoroscopic guidance. The catheter is ready for use.  Signed,  Criselda Peaches, MD  [facility]  [facility] Radiology   Electronically Signed   By: Jacqulynn Cadet M.D.   On: 12/05/2013 17:49    Recent Labs  12/05/13 0330 12/06/13 0345  WBC 13.5* 10.1  HGB 10.6* 10.5*  HCT 31.2* 31.5*  PLT 186 188    Recent Labs  12/05/13 0330 12/06/13 0345  NA 135* 135*  K 4.6 4.6  CL 96 98  GLUCOSE 120* 101*  BUN 28* 31*  CREATININE 0.46* 0.55  CALCIUM 8.2* 7.9*   CBG (last 3)   Recent Labs  12/06/13 2352 12/07/13 0353 12/07/13 0733  GLUCAP 174* 149* 118*    Wt Readings from Last 3 Encounters:  12/06/13 56.836 kg (125 lb 4.8 oz)  12/03/13 54.4 kg (119 lb 14.9 oz)  12/03/13 54.4 kg (119 lb 14.9 oz)    Physical Exam:  Constitutional:  No distress.  Frail appearing, alert  HENT:  Head: Normocephalic and atraumatic.  Right Ear: External ear normal.  Left Ear: External ear normal.  Eyes: Conjunctivae and EOM are normal. Pupils are equal, round, and reactive to light.  Neck: No JVD present. No tracheal deviation present. No thyromegaly present.  Cardiovascular: Normal rate and regular rhythm. Exam reveals friction rub. Exam reveals no gallop.  No  murmur heard.  Respiratory: No respiratory distress. She has no wheezes. She has no rales. She exhibits no tenderness.  GI: She exhibits no distension. There is no tenderness. There is no rebound.  Lymphadenopathy:  She has no cervical adenopathy.  Neurological:  Pt just waking up today.  Makes little eye contact. Language of confusion. Likely receptive and expressive language deficits as well.   moves all 4 limbs. Senses pain. CN grossly intact  Skin: Skin is warm.  Psychiatric:  Restless/confused   Assessment/Plan: 1. 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 Physiatrist is providing close team supervision and 24 hour management of active medical problems listed below. Physiatrist and rehab team continue to assess barriers to discharge/monitor patient progress toward functional and medical goals. FIM:                   Comprehension Comprehension Mode: Auditory Comprehension: 4-Understands basic 75 - 89% of the time/requires cueing 10 - 24% of the time  Expression Expression Mode: Verbal Expression: 3-Expresses basic 50 - 74% of the time/requires cueing 25 - 50% of the time. Needs to repeat parts of sentences.  Social Interaction Social Interaction: 5-Interacts appropriately 90% of the time - Needs monitoring or encouragement for participation or interaction.  Problem Solving Problem Solving: 3-Solves basic 50 - 74% of the time/requires cueing 25 - 49% of the time  Memory Memory: 1-Recognizes or recalls less than 25% of the time/requires cueing greater than 75% of the time   Medical Problem List and Plan:  Right temporal occipital metastatic tumor (SCLC)--  -XRT ongoing--?duration of therapy--has completed 4/14 treatments so far -steroid taper--decadron  1. DVT Prophylaxis/Anticoagulation: Pharmaceutical: Lovenox  2. Pain Management:  tylenol, hydrocodone  3. Mood: Resumed Lexapro. Team to provide emotional support to family and patient as appropriate 4. Neuropsych: This patient is not capable of making decisions on her own behalf.  5. FEN/malnutrition---D3 Nectars---will need cueing for adequate intake due to cognition   -eating 100% at present  -need to push fluids as BUN appears to be drifting up 6. Sz prophylaxis---keppra  LOS (Days) 1 A FACE TO FACE EVALUATION WAS PERFORMED  Meredith Staggers 12/07/2013 7:47 AM

## 2013-12-07 NOTE — Evaluation (Signed)
Physical Therapy Assessment and Plan  Patient Details  Name: Katie [name] MRN: [id number] Date of Birth: September 10, 1950  PT Diagnosis: Abnormality of gait, Coordination disorder, Difficulty walking, Impaired cognition and Muscle weakness Rehab Potential: Good ELOS: 21 to 28 days   Today's Date: 12/07/2013 Time: 1100-1145 Time Calculation (min): 45 min  Problem List:  Patient Active Problem List   Diagnosis Date Noted  . Brain tumor 12/06/2013  . 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 Malnutrition of moderate degree 11/19/2013  . Small cell lung cancer 11/18/2013  . HTN (hypertension) 11/18/2013  . Steroid-induced hyperglycemia 11/18/2013  . Fall with head trauma 11/18/2013  . Underweight 11/18/2013  . Protein-calorie malnutrition, severe 11/18/2013  . 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 brain metastases 11/15/2013  . Acute respiratory failure 11/12/2013  . Altered mental status 11/12/2013  . Syncope secondary to brain metastasis 11/11/2013    Past Medical History:  Past Medical History  Diagnosis Date  . Anxiety     takes lexapro   Past Surgical History:  Past Surgical History  Procedure Laterality Date  . Appendectomy    . Colon surgery      polyps  removed  . Craniotomy Right 11/12/2013    Procedure: RIGHT PARIETAL CRANIOTOMY;  Surgeon: Kristeen Miss, MD;  Location: [place] [facility];  Service: Neurosurgery;  Laterality: Right;  . Craniotomy Right 11/22/2013    Procedure: Revision of Right Parietal occipital wound due to CSF Leak;  Surgeon: Kristeen Miss, MD;  Location: [place] [facility] OR[facility];  Service: Neurosurgery;  Laterality: Right;  Revision of Right Parietal occipital wound due to CSF Leak    Assessment & Plan Clinical Impression: Patient is a 63 y.o. female with history of anxiety disorder otherwise in good health. She was admitted via [place] on 11/11/13 past syncopal event and sustained a fall with injury to left frontal region and obtunded at admission. CT brain done demonstrates at  least 2 large right-sided lesions one frontally and one parietal occipital area with 2 mm of shift with subfalcine herniation. Husband reported 6 week history of confusion with disorientation and falls. MRI brain with 4.4 x 6 cm right temporal parietal occipital mass with extensive vasogenic edema and high right frontal lobe mass 2.3 X 2.3 cm likely metastatic disease or possibly primary brain tumors as well as 10 mm Right to left shift with hydrocephalus. Patient taken to OR for Right parietal occipital crani for resection of tumor on 11/12/13 by Dr. Ellene Route. 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 CT lungs with LLL mass with subcarinal adenopathy. She was extubated without difficulty on 11/15/13.  She was evaluated by Dr. Marin Olp and chemo recommended for likely micrometastatic disease and XRT to brain recommended by Dr. Isidore Moos. She developed CSF leak requiring revision of incision with subgleal drain on 11/22/13. Mentation has fluctuated and she was placed on Vancomycin for HCAP. Drain removed but patient with continued subgaleal fluid collections therefore LP done with resolution. MBS done on 11/25/13 showing silent aspiration of thin liquids and she was placed on D3, nectar liquids. She was transferred back to [facility] for whole brain XRT on 12/03/13 and has completed 4/10 treatments. Single lumen Power Port placed on 12/05/13 by IVR and OK to shower within 24 hours. Therapies initiated and patient with impaired balance with ataxia, impulsivity,poor attention as well as dysphagia.   Patient transferred to CIR on 12/06/2013 .   Patient currently requires max with mobility secondary to muscle weakness, decreased cardiorespiratoy endurance and  decreased oxygen support and decreased initiation, decreased attention, decreased problem solving, decreased safety awareness and decreased memory.  Prior to hospitalization, patient was independent  with mobility and lived with  Spouse in a House home.  Home access is 3Stairs to enter.  Patient will benefit from skilled PT intervention to maximize safe functional mobility, minimize fall risk and decrease caregiver burden for planned discharge home with 24 hour assist.  Anticipate patient will benefit from follow up [facility] at discharge.  PT - End of Session Activity Tolerance: Tolerates 30+ min activity with multiple rests Endurance Deficit: Yes PT Assessment Rehab Potential: Good Barriers to Discharge: Inaccessible home environment PT Patient demonstrates impairments in the following area(s): Balance;Endurance;Motor;Safety PT Transfers Functional Problem(s): Bed Mobility;Bed to Chair;Car PT Locomotion Functional Problem(s): Ambulation;Wheelchair Mobility;Stairs PT Plan PT Intensity: Minimum of 1-2 x/day ,45 to 90 minutes PT Frequency: 5 out of 7 days PT Duration Estimated Length of Stay: 21 to 28 days PT Treatment/Interventions: Ambulation/gait training;Balance/vestibular training;Discharge planning;DME/adaptive equipment instruction;Functional electrical stimulation;Functional mobility training;Neuromuscular re-education;UE/LE Coordination activities;UE/LE Strength taining/ROM;Therapeutic Exercise;Therapeutic Activities;Patient/family education;Stair training;Wheelchair propulsion/positioning PT Transfers Anticipated Outcome(s): min gaurd to S for transfers PT Locomotion Anticipated Outcome(s): min gaurd for ambulation, min A for ambulation with LRAD, S for w/c mobility PT Recommendation Follow Up Recommendations: Home health PT Patient destination: Home Equipment Recommended: To be determined  Skilled Therapeutic Intervention PT evaluation completed and treatment plan initiated. Pt requiring multiple verbal cues for safety and technique throughout evaluation. Pt easily distracted with decreased recall for new information. Pt transfers fluctuated from max to total A. Pt was able to ambulate in parallel with max A and  max verbal cues for encouragement. Unable to attempt steps secondary to level of fatigue.   PT Evaluation Precautions/Restrictions Precautions Precautions: Fall Precaution Comments: impulsive, decreased safety awareness Restrictions Weight Bearing Restrictions: No General Chart Reviewed: Yes Amount of Missed PT Time (min): 15 Minutes (secondary to fatigue following OT evaluation) Missed Time Reason: Patient fatigue Family/Caregiver Present: Yes  Pain No c/o pain.  Home Living/Prior Functioning Home Living Available Help at Discharge: Family;Friend(s);Available 24 hours/day Type of Home: House Home Access: Stairs to enter [employer] of Steps: 3 Entrance Stairs-Rails: None (have to handles beside door to hold onto) Home Layout: Two level;Able to live on main level with bedroom/bathroom Alternate Level Stairs-Number of Steps: one flight Alternate Level Stairs-Rails: Right Additional Comments: pt's husband provided home living info and states he will be available 24/7 at home.   Lives With: Spouse Prior Function Level of Independence: Independent with gait;Independent with transfers  Able to Take Stairs?: Yes Vision/Perception  Pt wears glasses all the time as per husband.  Praxis Impaired: Initiation, perseveration Cognition Overall Cognitive Status: Impaired/Different from baseline Arousal/Alertness: Lethargic Orientation Level: Oriented to person;Disoriented to time;Disoriented to situation;Disoriented to place Attention: Sustained;Selective Sustained Attention: Impaired Selective Attention: Impaired Memory: Impaired Memory Impairment: Storage deficit;Decreased recall of new information;Decreased short term memory Decreased Short Term Memory: Verbal basic;Functional basic Awareness: Impaired Problem Solving: Impaired Executive Function: Reasoning;Decision Making Reasoning: Impaired Decision Making: Impaired Behaviors:  Restless;Impulsive;Perseveration Safety/Judgment: Impaired Sensation Sensation Light Touch: Not tested Stereognosis: Not tested Hot/Cold: Not tested Proprioception: Not tested Additional Comments: difficult to formally assess, although light touch B LEs apears grossly intact Coordination Gross Motor Movements are Fluid and Coordinated: Yes Fine Motor Movements are Fluid and Coordinated: Not tested Motor  Motor Motor: Ataxia  Mobility Bed Mobility Bed Mobility: Rolling Right;Supine to Sit;Sit to Supine Rolling Right: 2: Max assist Supine to Sit: 2: Max assist  Sit to Supine: 2: Max assist Transfers Transfers: Yes Sit to Stand: 1: +1 Total assist;2: Max assist Stand to Sit: 1: +1 Total assist;2: Max assist Stand Pivot Transfers: 2: Max assist;1: +1 Total assist Locomotion  Ambulation Ambulation: Yes Ambulation/Gait Assistance: 2: Max assist Ambulation Distance (Feet): 5 Feet Assistive device: Parallel bars Stairs / Additional Locomotion Stairs: Yes Stairs Assistance: Not tested (comment) Product manager Mobility: Yes Wheelchair Assistance: 3: Mod Lexicographer: Both lower extermities  Trunk/Postural Assessment  Cervical Assessment Cervical Assessment: Within Water engineer Thoracic Assessment: Within Functional Limits Lumbar Assessment Lumbar Assessment: Within Functional Limits Postural Control Postural Control: Deficits on evaluation  Balance Balance Balance Assessed: Yes Static Sitting Balance Static Sitting - Balance Support: Feet supported;Bilateral upper extremity supported Static Sitting - Level of Assistance: 5: Stand by assistance;3: Mod assist Static Standing Balance Static Standing - Balance Support: During functional activity Static Standing - Level of Assistance: 1: +1 Total assist;2: Max assist Dynamic Standing Balance Dynamic Standing - Balance Support: During functional activity Dynamic  Standing - Level of Assistance: 2: Max assist;1: +1 Total assist Extremity Assessment  RUE Assessment RUE Assessment: Within Functional Limits LUE Assessment LUE Assessment: Within Functional Limits RLE Assessment RLE Assessment: Exceptions to [facility] RLE AROM (degrees) Overall AROM Right Lower Extremity: Deficits RLE Overall AROM Comments: DF decreased to neutral RLE Strength RLE Overall Strength: Deficits RLE Overall Strength Comments: grossly 3-/5 to 3/5 LLE Assessment LLE Assessment: Exceptions to [facility] LLE AROM (degrees) Overall AROM Left Lower Extremity: Deficits LLE Overall AROM Comments: WFLs except DF to neutral LLE Strength LLE Overall Strength: Deficits LLE Overall Strength Comments: grossly 3-/5 to 3/5 throughout  FIM:  FIM - Bed/Chair Transfer Bed/Chair Transfer: 2: Supine > Sit: Max A (lifting assist/Pt. 25-49%);2: Sit > Supine: Max A (lifting assist/Pt. 25-49%);2: Bed > Chair or W/C: Max A (lift and lower assist);1: Bed > Chair or W/C: Total A (helper does all/Pt. < 25%);2: Chair or W/C > Bed: Max A (lift and lower assist);1: Chair or W/C > Bed: Total A (helper does all/Pt. < 25%) FIM - Locomotion: Wheelchair Locomotion: Wheelchair: 3: Travels 150 ft or more: maneuvers on rugs and over door sills with moderate assistance  (Pt: 50 - 74%) FIM - Locomotion: Ambulation Locomotion: Ambulation Assistive Devices: Parallel bars Ambulation/Gait Assistance: 2: Max assist Locomotion: Ambulation: 1: Travels less than 50 ft with maximal assistance (Pt: 25 - 49%) FIM - Locomotion: Stairs Locomotion: Stairs: 0: Activity did not occur   Refer to Care Plan for Long Term Goals  Recommendations for other services: None  Discharge Criteria: Patient will be discharged from PT if patient refuses treatment 3 consecutive times without medical reason, if treatment goals not met, if there is a change in medical status, if patient makes no progress towards goals or if patient is discharged from  hospital.  The above assessment, treatment plan, treatment alternatives and goals were discussed and mutually agreed upon: by patient and by family  Dub Amis 12/07/2013, 4:19 PM

## 2013-12-07 NOTE — Evaluation (Signed)
Occupational Therapy Assessment and Plan  Patient Details  Name: Katie [name] MRN: [id number] Date of Birth: 1950/09/28  OT Diagnosis: altered mental status, apraxia, cognitive deficits and muscle weakness (generalized) Rehab Potential: Rehab Potential: Good ELOS: 3-4 weeks   Today's Date: 12/07/2013 Time: 1000-1100 Time Calculation (min): 60 min  Problem List:  Patient Active Problem List   Diagnosis Date Noted  . Brain tumor 12/06/2013  . 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 Malnutrition of moderate degree 11/19/2013  . Small cell lung cancer 11/18/2013  . HTN (hypertension) 11/18/2013  . Steroid-induced hyperglycemia 11/18/2013  . Fall with head trauma 11/18/2013  . Underweight 11/18/2013  . Protein-calorie malnutrition, severe 11/18/2013  . 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 brain metastases 11/15/2013  . Acute respiratory failure 11/12/2013  . Altered mental status 11/12/2013  . Syncope secondary to brain metastasis 11/11/2013    Past Medical History:  Past Medical History  Diagnosis Date  . Anxiety     takes lexapro   Past Surgical History:  Past Surgical History  Procedure Laterality Date  . Appendectomy    . Colon surgery      polyps  removed  . Craniotomy Right 11/12/2013    Procedure: RIGHT PARIETAL CRANIOTOMY;  Surgeon: Kristeen Miss, MD;  Location: [place] [facility];  Service: Neurosurgery;  Laterality: Right;  . Craniotomy Right 11/22/2013    Procedure: Revision of Right Parietal occipital wound due to CSF Leak;  Surgeon: Kristeen Miss, MD;  Location: [place] [facility];  Service: Neurosurgery;  Laterality: Right;  Revision of Right Parietal occipital wound due to CSF Leak    Assessment & Plan Clinical Impression: Katie [name] is a 63 y.o. female with history of anxiety disorder otherwise in good health. She was admitted via [place] on 11/11/13 past syncopal event and sustained a fall with injury to left frontal region and obtunded at admission. CT brain done demonstrates  at least 2 large right-sided lesions one frontally and one parietal occipital area with 2 mm of shift with subfalcine herniation. Husband reported 6 week history of confusion with disorientation and falls. MRI brain with 4.4 x 6 cm right temporal parietal occipital mass with extensive vasogenic edema and high right frontal lobe mass 2.3 X 2.3 cm likely metastatic disease or possibly primary brain tumors as well as 10 mm Right to left shift with hydrocephalus. Patient taken to OR for Right parietal occipital crani for resection of tumor on 11/12/13 by Dr. Ellene Route. 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 CT lungs with LLL mass with subcarinal adenopathy. She was extubated without difficulty on 11/15/13.   She was evaluated by Dr. Marin Olp and chemo recommended for likely micrometastatic disease and XRT to brain recommended by Dr. Isidore Moos. She developed CSF leak requiring revision of incision with subgleal drain on 11/22/13. Mentation has fluctuated and she was placed on Vancomycin for HCAP. Drain removed but patient with continued subgaleal fluid collections therefore LP done with resolution. MBS done on 11/25/13 showing silent aspiration of thin liquids and she was placed on D3, nectar liquids. She was transferred back to [facility] for whole brain XRT on 12/03/13 and has completed 4/10 treatments. Single lumen Power Port placed on 12/05/13 by IVR and OK to shower within 24 hours. Therapies initiated and patient with impaired balance with ataxia, impulsivity,poor attention as well as dysphagia. Patient transferred to CIR on 12/06/2013 .    Patient currently requires max to total assistance with basic self-care skills secondary to muscle weakness  and decreased initiation, decreased attention, decreased problem solving, decreased safety awareness, decreased memory and delayed processing.  Prior to hospitalization with brain tumor diagnosis, patient could complete ADLs and IADLs with  independent .  Patient will benefit from skilled intervention to decrease level of assist with basic self-care skills, increase independence with basic self-care skills and increase level of independence with iADL prior to discharge home with care partner.  Anticipate patient will require 24 hour supervision and follow up home health.  OT - End of Session Activity Tolerance: Tolerates 30+ min activity with multiple rests Endurance Deficit: Yes OT Assessment Rehab Potential: Good OT Patient demonstrates impairments in the following area(s): Balance;Behavior;Cognition;Endurance;Motor;Safety OT Basic ADL's Functional Problem(s): Eating;Grooming;Bathing;Dressing;Toileting OT Transfers Functional Problem(s): Toilet;Tub/Shower OT Plan OT Intensity: Minimum of 1-2 x/day, 45 to 90 minutes OT Frequency: Total of 15 hours over 7 days OT Duration/Estimated Length of Stay: 3-4 weeks OT Treatment/Interventions: Balance/vestibular training;Cognitive remediation/compensation;Community reintegration;Discharge planning;DME/adaptive equipment instruction;Functional mobility training;Neuromuscular re-education;Patient/family education;Psychosocial support;UE/LE Strength taining/ROM;Therapeutic Exercise;Therapeutic Activities;Self Care/advanced ADL retraining;UE/LE Coordination activities;Wheelchair propulsion/positioning;Visual/perceptual remediation/compensation OT Recommendation Patient destination: Home Follow Up Recommendations: Home health OT Equipment Recommended: To be determined   Skilled Therapeutic Intervention Patient seen for ADL FIM and evaluation this morning with emphasis on ADL retraining and instruction.  Patient's husband and sister in room with patient upon therapist arrival; husband left room and sister stayed throughout session.  Patient with improved attention with husband out of room.  Patient completes ADL bedlevel secondary to decreased functional mobility, activity tolerance, and  cognition.  Patient with poor attention, organization, termination, sequencing, and initiation this morning.  Patient kept eyes closed the majority of session and requires verbal cues for opening eyes to participate.  Patient with inappropriate verbal responses during ADL but responded well to redirection.  Patient requires max to total assistance for ADLs.  [name] sister reports prior to brain tumor, patient with cognition WFL.    OT Evaluation Precautions/Restrictions  Precautions Precautions: Fall Precaution Comments: impulsive, poor safety cognition Restrictions Weight Bearing Restrictions: No General Chart Reviewed: Yes Missed Time Reason: Patient fatigue Pain Pain Assessment Pain Assessment: No/denies pain Home Living/Prior Functioning Home Living Available Help at Discharge: Family;Friend(s) Type of Home: House Home Access: Stairs to enter [employer] of Steps: 3 Entrance Stairs-Rails: None Home Layout: Two level;Able to live on main level with bedroom/bathroom Alternate Level Stairs-Number of Steps: one flight Alternate Level Stairs-Rails: Right Additional Comments: pt's husband provided home living info and states he will be available 24/7 at home.   Lives With: Spouse ADL  See FIM Vision/Perception  Vision- Assessment Vision Assessment?: Vision impaired- to be further tested in functional context  Cognition Overall Cognitive Status: Impaired/Different from baseline Arousal/Alertness: Awake/alert Orientation Level: Oriented to person;Oriented to situation;Disoriented to place;Disoriented to time Memory: Impaired Memory Impairment: Storage deficit;Decreased recall of new information;Decreased short term memory Decreased Short Term Memory: Verbal basic;Functional basic Behaviors: Restless;Impulsive;Perseveration Safety/Judgment: Impaired Sensation Sensation Light Touch: Not tested Stereognosis: Not tested Hot/Cold: Not tested Proprioception: Not  tested Coordination Gross Motor Movements are Fluid and Coordinated: Yes Fine Motor Movements are Fluid and Coordinated: Not tested Motor  Motor Motor: Within Functional Limits Mobility   See PT evaluation  Trunk/Postural Assessment   See PT evaluation  Balance  See PT evaluation Extremity/Trunk Assessment RUE Assessment RUE Assessment: Within Functional Limits LUE Assessment LUE Assessment: Within Functional Limits  FIM:  FIM - Eating Eating Activity: 0: Activity did not occur FIM - Grooming Grooming Steps: Wash, rinse, dry face;Wash, rinse, dry hands;Brush, comb  hair Grooming: 2: Patient completes 1 of 4 or 2 of 5 steps FIM - Bathing Bathing Steps Patient Completed: Chest;Abdomen;Right Arm;Left Arm Bathing: 2: Max-Patient completes 3-4 27f10 parts or 25-49% FIM - Upper Body Dressing/Undressing Upper body dressing/undressing steps patient completed: Put head through opening of pull over shirt/dress Upper body dressing/undressing: 2: Max-Patient completed 25-49% of tasks FIM - Lower Body Dressing/Undressing Lower body dressing/undressing: 1: Total-Patient completed less than 25% of tasks FIM - Toileting Toileting: 0: Activity did not occur FIM - Bed/Chair Transfer Bed/Chair Transfer: 2: Supine > Sit: Max A (lifting assist/Pt. 25-49%);2: Sit > Supine: Max A (lifting assist/Pt. 25-49%);2: Bed > Chair or W/C: Max A (lift and lower assist);1: Bed > Chair or W/C: Total A (helper does all/Pt. < 25%);2: Chair or W/C > Bed: Max A (lift and lower assist);1: Chair or W/C > Bed: Total A (helper does all/Pt. < 25%)   Refer to Care Plan for Long Term Goals  Recommendations for other services: None  Discharge Criteria: Patient will be discharged from OT if patient refuses treatment 3 consecutive times without medical reason, if treatment goals not met, if there is a change in medical status, if patient makes no progress towards goals or if patient is discharged from hospital.  The above  assessment, treatment plan, treatment alternatives and goals were discussed and mutually agreed upon: by family  JOsa Craver4/18/2015, 1:22 PM

## 2013-12-08 LAB — GLUCOSE, CAPILLARY
Glucose-Capillary: 114 mg/dL — ABNORMAL HIGH (ref 70–99)
Glucose-Capillary: 116 mg/dL — ABNORMAL HIGH (ref 70–99)
Glucose-Capillary: 120 mg/dL — ABNORMAL HIGH (ref 70–99)
Glucose-Capillary: 123 mg/dL — ABNORMAL HIGH (ref 70–99)
Glucose-Capillary: 135 mg/dL — ABNORMAL HIGH (ref 70–99)
Glucose-Capillary: 202 mg/dL — ABNORMAL HIGH (ref 70–99)

## 2013-12-08 NOTE — Progress Notes (Signed)
[place] PHYSICAL MEDICINE & REHABILITATION     PROGRESS NOTE    Subjective/Complaints: Slept well last night. Had 3 soft bm's since yesterday evening. No pain. Husband present  Objective: Vital Signs: Blood pressure 117/62, pulse 66, temperature 97.9 F (36.6 C), temperature source Axillary, resp. rate 18, height 5\' 7"  (1.702 m), weight 56.836 kg (125 lb 4.8 oz), SpO2 99.00%. No results found.  Recent Labs  12/06/13 0345  WBC 10.1  HGB 10.5*  HCT 31.5*  PLT 188    Recent Labs  12/06/13 0345  NA 135*  K 4.6  CL 98  GLUCOSE 101*  BUN 31*  CREATININE 0.55  CALCIUM 7.9*   CBG (last 3)   Recent Labs  12/07/13 1956 12/08/13 0002 12/08/13 0347  GLUCAP 138* 135* 123*    Wt Readings from Last 3 Encounters:  12/06/13 56.836 kg (125 lb 4.8 oz)  12/03/13 54.4 kg (119 lb 14.9 oz)  12/03/13 54.4 kg (119 lb 14.9 oz)    Physical Exam:  Constitutional: No distress.  Frail appearing, alert  HENT:  Head: Normocephalic and atraumatic.  Right Ear: External ear normal.  Left Ear: External ear normal.  Eyes: Conjunctivae and EOM are normal. Pupils are equal, round, and reactive to light.  Neck: No JVD present. No tracheal deviation present. No thyromegaly present.  Cardiovascular: Normal rate and regular rhythm. Exam reveals friction rub. Exam reveals no gallop.  No murmur heard.  Respiratory: No respiratory distress. She has no wheezes. She has no rales. She exhibits no tenderness.  GI: She exhibits no distension. There is no tenderness. There is no rebound.  Lymphadenopathy:  She has no cervical adenopathy.  Neurological:  Pt alert. More attentive. Still confused. Stated she was at [facility]. Continued receptive and expressive language deficits as well.   moves all 4 limbs. Senses pain. CN grossly intact  Skin: Skin is warm.  Psychiatric:   confused   Assessment/Plan: 1. Functional deficits secondary to metastatic lung ca to the brain which require 3+ hours per day of  interdisciplinary therapy in a comprehensive inpatient rehab setting. Physiatrist is providing close team supervision and 24 hour management of active medical problems listed below. Physiatrist and rehab team continue to assess barriers to discharge/monitor patient progress toward functional and medical goals. FIM: FIM - Bathing Bathing Steps Patient Completed: Chest;Abdomen;Right Arm;Left Arm Bathing: 2: Max-Patient completes 3-4 19f 10 parts or 25-49%  FIM - Upper Body Dressing/Undressing Upper body dressing/undressing steps patient completed: Put head through opening of pull over shirt/dress Upper body dressing/undressing: 2: Max-Patient completed 25-49% of tasks FIM - Lower Body Dressing/Undressing Lower body dressing/undressing: 1: Total-Patient completed less than 25% of tasks  FIM - Toileting Toileting: 0: Activity did not occur  FIM - Radio producer Devices: Recruitment consultant Transfers: 1-Two helpers  FIM - IT sales professional Transfer: 2: Supine > Sit: Max A (lifting assist/Pt. 25-49%);2: Sit > Supine: Max A (lifting assist/Pt. 25-49%);2: Bed > Chair or W/C: Max A (lift and lower assist);1: Bed > Chair or W/C: Total A (helper does all/Pt. < 25%);2: Chair or W/C > Bed: Max A (lift and lower assist);1: Chair or W/C > Bed: Total A (helper does all/Pt. < 25%)  FIM - Locomotion: Wheelchair Locomotion: Wheelchair: 3: Travels 150 ft or more: maneuvers on rugs and over door sills with moderate assistance  (Pt: 50 - 74%) FIM - Locomotion: Ambulation Locomotion: Ambulation Assistive Devices: Parallel bars Ambulation/Gait Assistance: 2: Max assist Locomotion: Ambulation: 1: Travels  less than 50 ft with maximal assistance (Pt: 25 - 49%)  Comprehension Comprehension Mode: Auditory Comprehension: 2-Understands basic 25 - 49% of the time/requires cueing 51 - 75% of the time  Expression Expression Mode: Verbal Expression: 2-Expresses basic 25 -  49% of the time/requires cueing 50 - 75% of the time. Uses single words/gestures.  Social Interaction Social Interaction: 2-Interacts appropriately 25 - 49% of time - Needs frequent redirection.  Problem Solving Problem Solving: 2-Solves basic 25 - 49% of the time - needs direction more than half the time to initiate, plan or complete simple activities  Memory Memory: 1-Recognizes or recalls less than 25% of the time/requires cueing greater than 75% of the time   Medical Problem List and Plan:  Right temporal occipital metastatic tumor (SCLC)--  -XRT ongoing--?duration of therapy--has completed 4/14 treatments so far -steroid taper--decadron  1. DVT Prophylaxis/Anticoagulation: Pharmaceutical: Lovenox  2. Pain Management: tylenol, hydrocodone  3. Mood: Resumed Lexapro. Team to provide emotional support to family and patient as appropriate 4. Neuropsych: This patient is not capable of making decisions on her own behalf.  5. FEN/malnutrition---D3 Nectars---   -eating 100% at present  - push fluids as BUN appears to be drifting up 6. Sz prophylaxis---keppra  LOS (Days) 2 A FACE TO FACE EVALUATION WAS PERFORMED  Meredith Staggers 12/08/2013 7:41 AM

## 2013-12-08 NOTE — IPOC Note (Addendum)
Overall Plan of Care [facility]) Patient Details Name: Katie [name] MRN: [id number] DOB: 1950/10/20  Admitting Diagnosis: LUNG CA WITH BRAIN METS  Hospital Problems: Active Problems:   Brain tumor     Functional Problem List: Nursing Behavior;Bladder;Bowel;Endurance;Medication Management;Pain;Perception;Safety;Skin Integrity  PT Balance;Endurance;Motor;Safety  OT Balance;Behavior;Cognition;Endurance;Motor;Safety  SLP Cognition;Linguistic;Safety;Nutrition  TR  activity tolerance, balance, safety, cognition       Basic ADL's: OT Eating;Grooming;Bathing;Dressing;Toileting     Advanced  ADL's: OT       Transfers: PT Bed Mobility;Bed to Chair;Car  OT Toilet;Tub/Shower     Locomotion: PT Ambulation;Wheelchair Mobility;Stairs     Additional Impairments: OT    SLP Swallowing;Communication;Social Cognition comprehension;expression Awareness;Attention;Memory;Problem Solving;Social Interaction  TR      Anticipated Outcomes Item Anticipated Outcome  Self Feeding    Swallowing  Supervision    Basic self-care     Toileting      Bathroom Transfers    Bowel/Bladder  Manage bowel and bladder with minimal assist  Transfers  min gaurd to S for transfers  Locomotion  min gaurd for ambulation, min A for ambulation with LRAD, S for w/c mobility  Communication  Mod assist   Cognition  Mod assist  Pain  3 or less out of 10  Safety/Judgment  mod assist   Therapy Plan: PT Intensity: Minimum of 1-2 x/day ,45 to 90 minutes PT Frequency: 5 out of 7 days PT Duration Estimated Length of Stay: 21 to 28 days OT Intensity: Minimum of 1-2 x/day, 45 to 90 minutes OT Frequency: Total of 15 hours over 7 days OT Duration/Estimated Length of Stay: 3-4 weeks SLP Intensity: Minumum of 1-2 x/day, 30 to 90 minutes SLP Frequency: 5 out of 7 days SLP Duration/Estimated Length of Stay: 21-28 days        Team Interventions: Nursing Interventions Patient/Family Education;Bladder  Management;Bowel Management;Disease Management/Prevention;Pain Management;Medication Management;Skin Care/Wound Management  PT interventions Ambulation/gait training;Balance/vestibular training;Discharge planning;DME/adaptive equipment instruction;Functional electrical stimulation;Functional mobility training;Neuromuscular re-education;UE/LE Coordination activities;UE/LE Strength taining/ROM;Therapeutic Exercise;Therapeutic Activities;Patient/family education;Stair training;Wheelchair propulsion/positioning  OT Interventions Balance/vestibular training;Cognitive remediation/compensation;Community reintegration;Discharge planning;DME/adaptive equipment instruction;Functional mobility training;Neuromuscular re-education;Patient/family education;Psychosocial support;UE/LE Strength taining/ROM;Therapeutic Exercise;Therapeutic Activities;Self Care/advanced ADL retraining;UE/LE Coordination activities;Wheelchair propulsion/positioning;Visual/perceptual remediation/compensation  SLP Interventions Cognitive remediation/compensation;Cueing hierarchy;Dysphagia/aspiration precaution training;Environmental controls;Functional tasks;Internal/external aids;Patient/family education;Speech/Language facilitation;Therapeutic Activities  TR Interventions  recreation/leisure participation, therapeutic activities, functional mobility, cognitive retraining/compensatory strategies, adaptive equipment education/use, community reintegration, pt/family education, discharge planning, psychosocial support, UE/LE strength/coordination  SW/CM Interventions      Team Discharge Planning: Destination: PT-Home ,OT- Home , SLP-Home Projected Follow-up: PT-Home health PT, OT-  Home health OT, SLP-Home Health SLP;24 hour supervision/assistance Projected Equipment Needs: PT-To be determined, OT- To be determined, SLP-None recommended by SLP Equipment Details: PT- , OT-  Patient/family involved in discharge planning: PT- Patient;Family  member/caregiver,  OT-Family Midwife, SLP-Patient;Family member/caregiver  MD ELOS: 21 days Medical Rehab Prognosis:  Good Assessment: The patient has been admitted for CIR therapies. The team will be addressing functional mobility, strength, stamina, balance, safety, adaptive techniques and equipment, self-care, bowel and bladder mgt, patient and caregiver education, cognition, visual perceptual and cognitive perceptual awareness, language, nutrition, pain mgt, ego-support, NMR. Goals have been set at supervision to min assist with self-care and mobility and mod assist for cognition and language.    Meredith Staggers, MD, FAAPMR      See Team Conference Notes for weekly updates to the plan of care

## 2013-12-09 ENCOUNTER — Ambulatory Visit
Admit: 2013-12-09 | Discharge: 2013-12-09 | Disposition: A | Discharge status: Home / Self Care | Payer: BC Managed Care – PPO | Attending: Radiation Oncology | Admitting: Radiation Oncology

## 2013-12-09 ENCOUNTER — Inpatient Hospital Stay (HOSPITAL_COMMUNITY): Payer: BC Managed Care – PPO | Admitting: Speech Pathology

## 2013-12-09 ENCOUNTER — Inpatient Hospital Stay (HOSPITAL_COMMUNITY): Payer: BC Managed Care – PPO

## 2013-12-09 ENCOUNTER — Encounter (HOSPITAL_COMMUNITY): Payer: BC Managed Care – PPO

## 2013-12-09 DIAGNOSIS — C7949 Secondary malignant neoplasm of other parts of nervous system: Secondary | ICD-10-CM

## 2013-12-09 DIAGNOSIS — C7931 Secondary malignant neoplasm of brain: Secondary | ICD-10-CM

## 2013-12-09 DIAGNOSIS — F341 Dysthymic disorder: Secondary | ICD-10-CM

## 2013-12-09 DIAGNOSIS — I10 Essential (primary) hypertension: Secondary | ICD-10-CM

## 2013-12-09 DIAGNOSIS — R4182 Altered mental status, unspecified: Secondary | ICD-10-CM

## 2013-12-09 DIAGNOSIS — C3490 Malignant neoplasm of unspecified part of unspecified bronchus or lung: Secondary | ICD-10-CM

## 2013-12-09 LAB — GLUCOSE, CAPILLARY
GLUCOSE-CAPILLARY: 126 mg/dL — AB (ref 70–99)
GLUCOSE-CAPILLARY: 131 mg/dL — AB (ref 70–99)
GLUCOSE-CAPILLARY: 186 mg/dL — AB (ref 70–99)
Glucose-Capillary: 118 mg/dL — ABNORMAL HIGH (ref 70–99)
Glucose-Capillary: 150 mg/dL — ABNORMAL HIGH (ref 70–99)
Glucose-Capillary: 138 mg/dL — ABNORMAL HIGH (ref 70–99)

## 2013-12-09 MED ORDER — DEXAMETHASONE 6 MG PO TABS
6.0000 mg | ORAL_TABLET | Freq: Two times a day (BID) | ORAL | Status: DC
  Administered 2013-12-09 – 2013-12-20 (×22): 6 mg via ORAL
  Filled 2013-12-09 (×25): qty 1

## 2013-12-09 MED ORDER — SODIUM CHLORIDE 0.9 % IJ SOLN
10.0000 mL | INTRAMUSCULAR | Status: DC | PRN
  Administered 2013-12-09 – 2013-12-12 (×3): 10 mL

## 2013-12-09 NOTE — Progress Notes (Signed)
Speech Language Pathology Daily Session Note  Patient Details  Name: Katie [name] MRN: [id number] Date of Birth: Jun 28, 1951  Today's Date: 12/09/2013 Time: 0800-0830 Time Calculation (min): 30 min  Short Term Goals: Week 1: SLP Short Term Goal 1 (Week 1): Patient will consume PO with Mod level verbal cues for pacing and portion control. SLP Short Term Goal 2 (Week 1): Patient will sustain attention to task for 1 minute with Mod verbal cues for redirection. SLP Short Term Goal 3 (Week 1): Patient will utilize external aids for orientation with Max assist.  SLP Short Term Goal 4 (Week 1): Patient will label 2 deficits with Max clinician cues.  SLP Short Term Goal 5 (Week 1): Patient will solve basic problems with Mod assist. SLP Short Term Goal 6 (Week 1): Patient maintain topic of conversation for 2 turns with Max clinician cues.  Skilled Therapeutic Interventions: Skilled treatment session focused on cognitive goals. SLP facilitated session by providing Max A verbal and tactile cues for sustained attention for ~20 seconds during functional tasks. Pt required Mod verbal cues to keep her eyes open during the session and Max-total A for utilization of external memory aids to increase recall of functional information. Pt with severe language of confusion throughout the session and required total A to self-monitor. [name] husband present and educated on strategies to increase recall and verbalized understanding. Continue with current plan of care.   FIM:  Comprehension Comprehension Mode: Auditory Comprehension: 2-Understands basic 25 - 49% of the time/requires cueing 51 - 75% of the time Expression Expression: 2-Expresses basic 25 - 49% of the time/requires cueing 50 - 75% of the time. Uses single words/gestures. Social Interaction Social Interaction: 2-Interacts appropriately 25 - 49% of time - Needs frequent redirection. Problem Solving Problem Solving: 2-Solves basic 25 - 49% of the time -  needs direction more than half the time to initiate, plan or complete simple activities Memory Memory: 1-Recognizes or recalls less than 25% of the time/requires cueing greater than 75% of the time  Pain Pain Assessment Pain Assessment: No/denies pain  Therapy/Group: Individual Therapy  Buzzy Han 12/09/2013, 11:03 AM

## 2013-12-09 NOTE — Progress Notes (Signed)
Physical Therapy Session Note  Patient Details  Name: Katie [name] MRN: [id number] Date of Birth: [birth year]  Today's Date: 12/09/2013 Time: 3220-2542 Time Calculation (min): 45 min  Short Term Goals: Week 1:  PT Short Term Goal 1 (Week 1): Pt will increase bed mobility to mod A.  PT Short Term Goal 2 (Week 1): Pt will increase transfer bed to chair, chair to bed to mod A.  PT Short Term Goal 3 (Week 1): Pt will increase ambulation with LRAD to mod A about 25 feet.  PT Short Term Goal 4 (Week 1): Pt will ascend/descend 2 stairs with B rails and max A.  PT Short Term Goal 5 (Week 1): Pt will propel w/c about 100 feet with B LEs and min A.   Skilled Therapeutic Interventions/Progress Updates:  1:1. Pt received sitting in w/c, ready for therapy. Focus this session on alertness/arousal and sustained attention to therapeutic tasks. Pt req max consistent multimodal cues to open eyes as well as max redirection to tasks due to perseveration and internal distraction. Pt propelled w/c x175' w/ B LE and mod-max A, however, when distracted demonstrates inattention to B LE (L>R). Pt req max A for t/f sit<>stand w/ use of rail and multimodal cues to improve posture, but consistently attempting to sit down due to fatigue and unable to maintain standing >5-10seconds. Pt demonstrated improved sustained attention to seated (supervision-min guard A) ball toss/counting activity with husband, maintaining EO and counting up to 20 passes w/ mod cueing. Pt very adamant about want to lie down due to fatigue despite encouragement and cues for redirection. Pt req max A for all squat pivot t/fs tx mat<>w/c<>bed as well as t/f sit>sup. Pt supine in bed at end of session w/ all needs in reach, bed alarm on and husband in room.   Therapy Documentation Precautions:  Precautions Precautions: Fall Precaution Comments: impulsive, decreased safety awareness Restrictions Weight Bearing Restrictions: No General: Amount of  Missed PT Time (min): 15 Minutes Missed Time Reason: Patient fatigue  See FIM for current functional status  Therapy/Group: Individual Therapy  Gilmore Laroche 12/09/2013, 11:22 AM

## 2013-12-09 NOTE — Progress Notes (Signed)
Recreational Therapy Assessment and Plan  Patient Details  Name: Katie [name] MRN: [id number] Date of Birth: 08/29/1950 Today's Date: 12/09/2013  Rehab Potential: Good ELOS: 3 weeks   Assessment Clinical Impression:Problem List:  Patient Active Problem List    Diagnosis  Date Noted   .  Brain tumor  12/06/2013   .  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  Malnutrition of moderate degree  11/19/2013   .  Small cell lung cancer  11/18/2013   .  HTN (hypertension)  11/18/2013   .  Steroid-induced hyperglycemia  11/18/2013   .  Fall with head trauma  11/18/2013   .  Underweight  11/18/2013   .  Protein-calorie malnutrition, severe  11/18/2013   .  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  brain metastases  11/15/2013   .  Acute respiratory failure  11/12/2013   .  Altered mental status  11/12/2013   .  Syncope secondary to brain metastasis  11/11/2013    Past Medical History:  Past Medical History   Diagnosis  Date   .  Anxiety      takes lexapro    Past Surgical History:  Past Surgical History   Procedure  Laterality  Date   .  Appendectomy     .  Colon surgery       polyps removed   .  Craniotomy  Right  11/12/2013     Procedure: RIGHT PARIETAL CRANIOTOMY; Surgeon: Kristeen Miss, MD; Location: [facility]; Service: Neurosurgery; Laterality: Right;   .  Craniotomy  Right  11/22/2013     Procedure: Revision of Right Parietal occipital wound due to CSF Leak; Surgeon: Kristeen Miss, MD; Location: [facility]; Service: Neurosurgery; Laterality: Right; Revision of Right Parietal occipital wound due to CSF Leak    Assessment & Plan  Clinical Impression: Patient is a 63 y.o. female with history of anxiety disorder otherwise in good health. She was admitted via [place] on 11/11/13 past syncopal event and sustained a fall with injury to left frontal region and obtunded at admission. CT brain done demonstrates at least 2 large right-sided lesions one frontally and one parietal occipital area  with 2 mm of shift with subfalcine herniation. Husband reported 6 week history of confusion with disorientation and falls. MRI brain with 4.4 x 6 cm right temporal parietal occipital mass with extensive vasogenic edema and high right frontal lobe mass 2.3 X 2.3 cm likely metastatic disease or possibly primary brain tumors as well as 10 mm Right to left shift with hydrocephalus. Patient taken to OR for Right parietal occipital crani for resection of tumor on 11/12/13 by Dr. Ellene Route. 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 CT lungs with LLL mass with subcarinal adenopathy. She was extubated without difficulty on 11/15/13. She was evaluated by Dr. Marin Olp and chemo recommended for likely micrometastatic disease and XRT to brain recommended by Dr. Isidore Moos. She developed CSF leak requiring revision of incision with subgleal drain on 11/22/13. Mentation has fluctuated and she was placed on Vancomycin for HCAP. Drain removed but patient with continued subgaleal fluid collections therefore LP done with resolution. MBS done on 11/25/13 showing silent aspiration of thin liquids and she was placed on D3, nectar liquids. She was transferred back to [facility] for whole brain XRT on 12/03/13 and has completed 4/10 treatments. Single lumen Power Port placed on 12/05/13 by IVR and OK to shower within 24 hours. Therapies initiated and patient  with impaired balance with ataxia, impulsivity,poor attention as well as dysphagia. Patient transferred to CIR on 12/06/2013.  Pt presents with decreased activity tolerance, decreased functional mobility, decreased balance, decreased initiation, decreased attention, decreased problem solving, decreased safety awareness and decreased memory limiting pt's independence with leisure/community pursuits.   Leisure History/Participation Premorbid leisure interest/current participation: [place];Petra [place] - Flower gardening;Nature - Vegetable gardening;Nature -  [place] care;Community - Shopping mall;Community - Grocery store;Community - Travel (Comment) Expression Interests: Music (Comment);Dance Other Leisure Interests: Cooking/Baking Leisure Participation Style: With Family/Friends Awareness of Community Resources: Good-identify 3 post discharge leisure resources Psychosocial / Spiritual Does patient have pets?: No Social interaction - Mood/Behavior: Cooperative Engineer, drilling for Education?: Yes Recreational Therapy Orientation Orientation -Reviewed with patient: Available activity resources Strengths/Weaknesses Patient Strengths/Abilities: Willingness to participate;Active premorbidly Patient weaknesses: Physical limitations TR Patient demonstrates impairments in the following area(s): Behavior;Edema;Endurance;Motor;Safety  Plan Rec Therapy Plan Is patient appropriate for Therapeutic Recreation?: Yes Rehab Potential: Good Treatment times per week: Min 1 time per week >47mnutes Estimated Length of Stay: 3 weeks TR Treatment/Interventions: Adaptive equipment instruction;1:1 session;Balance/vestibular training;Functional mobility training;Community reintegration;Cognitive remediation/compensation;Patient/family education;Therapeutic activities;Recreation/leisure participation;Provide activity resources in room;Therapeutic exercise;UE/LE Coordination activities;Visual/perceptual remediation/compensation  Recommendations for other services: None  Discharge Criteria: Patient will be discharged from TR if patient refuses treatment 3 consecutive times without medical reason.  If treatment goals not met, if there is a change in medical status, if patient makes no progress towards goals or if patient is discharged from hospital.  The above assessment, treatment plan, treatment alternatives and goals were discussed and mutually agreed upon: by patient  LWaldon Reining4/20/2015, 12:11 PM

## 2013-12-09 NOTE — Progress Notes (Signed)
   Weekly Management Note:  inpatient Current Dose:  15 Gy  Projected Dose: 30 Gy   Narrative:  The patient presents for routine under treatment assessment.  CBCT/MVCT images/Port film x-rays were reviewed.  The chart was checked.States she is hungry. Denies HAs. No complaints.  Physical Findings:  vitals were not taken for this visit. On gurney. No oral thrush. No oozing from scalp incision, which appears to have healed.   Impression:  The patient is tolerating radiotherapy.  Plan:  Continue radiotherapy as planned.    ________________________________   Eppie Gibson, M.D.

## 2013-12-09 NOTE — Progress Notes (Signed)
Occupational Therapy Session Note  Patient Details  Name: Katie [name] MRN: [id number] Date of Birth: 01/01/1951  Today's Date: 12/09/2013  Session 1 Time: 0900-1000 Time Calculation (min): 60 min  Short Term Goals: Week 1:  OT Short Term Goal 1 (Week 1): Patient will complete wheelchair to toilet transfer with moderate assistance. OT Short Term Goal 2 (Week 1): Patient will groom at the sink with minimum assistance. OT Short Term Goal 3 (Week 1): Patient will complete upper body dressing with moderate assistance. OT Short Term Goal 4 (Week 1): Patient will complete lower body dressing with maximum assistance. OT Short Term Goal 5 (Week 1): Patient will complete toileting with maximum assistance.  Skilled Therapeutic Interventions/Progress Updates:    Pt engaged in BADL retraining including bathing and dressing with sit<>stand from w/c at sink.  [name] husband present and provided appropriate verbal cues throughout session.  Pt required max A for sit<>stand at sink and max A while standing.  Pt performed w/c<>BSC transfer with max A and total A for toileting tasks. Pt required max verbal cues throughout session to initiate and attend to tasks.  Pt often exhibited language of confusion when responding to questions by therapist or husband.  Pt required max verbal cues to open eyes to attend to tasks. Focus on bed mobility, activity tolerance, sitting balance, standing balance, sit<>stand, transfers, and safety awareness.   Therapy Documentation Precautions:  Precautions Precautions: Fall Precaution Comments: impulsive, decreased safety awareness Restrictions Weight Bearing Restrictions: No Pain: Pain Assessment Pain Assessment: No/denies pain  See FIM for current functional status  Therapy/Group: Individual Therapy  Session 2 Time: 1130-1200 Pt denies pain Individual Therapy (cotx with Recreational Therapist)  Pt resting in bed with husband at bedside at bedside. Pt sat EOB with  mod A and max verbal cues to initiate task and for sequencing.  Pt performed squat pivot transfer requiring mod A and max verbal cues for sequencing. Attempted to engage patient in vision assessment but patient was unable to attend to one step commands to complete assessment.  Attempted to have patient comb hair while seated.  Pt required max verbal cues and mod A to complete tasks.  Pt required max verbal cues to attend to tasks and for redirection throughout session.   Katie [name] 12/09/2013, 10:06 AM

## 2013-12-09 NOTE — Progress Notes (Signed)
Mrs. Swigert looks quite good. I really appreciate rehabilitation and help her out. It looks like she is making some nice progress. She's eating well. There is no aspiration. She's out of bed. She's not having pain. There's no seizures. She's had no cough or shortness of breath.  She is taking radiation therapy without problems.  There is no bleeding. She's had no obvious headache. There's been no nausea vomiting.  All of her vital signs are stable. Blood pressure 120/75. Temperature 98.2. Her head and neck exam shows no thrush. Shows no adenopathy in the neck. Lungs are clear. Cardiac exam regular rate and rhythm. Abdomen is soft. Good bowel sounds. Extremities shows no clubbing cyanosis or edema. She has good strength. She has good range of motion of her joints. Neurological exam shows no focal neurological deficits. At her surgical site in the right parietal skull, there is still some swelling which is nontender.  Her labs which were done 3 days ago looked fine. I do not think that we have to repeat any labs.  She will continue her radiation as an inpatient for now.  We will continue to follow along.  Frederich Cha 4:7

## 2013-12-09 NOTE — Progress Notes (Signed)
[place] PHYSICAL MEDICINE & REHABILITATION     PROGRESS NOTE    Subjective/Complaints: Had a reasonable night. Family was concerned about fluid around op site increasing. No headache or pain. Eating well.  Objective: Vital Signs: Blood pressure 125/75, pulse 67, temperature 98.2 F (36.8 C), temperature source Oral, resp. rate 18, height 5\' 7"  (1.702 m), weight 57.425 kg (126 lb 9.6 oz), SpO2 99.00%. No results found. No results found for this basename: WBC, HGB, HCT, PLT,  in the last 72 hours No results found for this basename: NA, K, CL, CO, GLUCOSE, BUN, CREATININE, CALCIUM,  in the last 72 hours CBG (last 3)   Recent Labs  12/09/13 0056 12/09/13 0336 12/09/13 0732  GLUCAP 186* 126* 118*    Wt Readings from Last 3 Encounters:  12/09/13 57.425 kg (126 lb 9.6 oz)  12/03/13 54.4 kg (119 lb 14.9 oz)  12/03/13 54.4 kg (119 lb 14.9 oz)    Physical Exam:  Constitutional: No distress.  Frail appearing, alert  HENT:  Head: Normocephalic and atraumatic.  Right Ear: External ear normal.  Left Ear: External ear normal.  Eyes: Conjunctivae and EOM are normal. Pupils are equal, round, and reactive to light.  Neck: No JVD present. No tracheal deviation present. No thyromegaly present.  Cardiovascular: Normal rate and regular rhythm. Exam reveals friction rub. Exam reveals no gallop.  No murmur heard.  Respiratory: No respiratory distress. She has no wheezes. She has no rales. She exhibits no tenderness.  GI: She exhibits no distension. There is no tenderness. There is no rebound.  Lymphadenopathy:  She has no cervical adenopathy.  Neurological:  Pt alert. More attentive. Still confused. Stated she was at [facility]. Continued receptive and expressive language deficits as well. Language of confusions. Typically keeps eyes closed unless cued. moves all 4 limbs. Senses pain. CN grossly intact  Skin: Skin is warm. Op site intact with some fluid still under skin---no real  change Psychiatric:   confused   Assessment/Plan: 1. 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 Physiatrist is providing close team supervision and 24 hour management of active medical problems listed below. Physiatrist and rehab team continue to assess barriers to discharge/monitor patient progress toward functional and medical goals. FIM: FIM - Bathing Bathing Steps Patient Completed: Chest;Abdomen;Right Arm;Left Arm Bathing: 2: Max-Patient completes 3-4 31f 10 parts or 25-49%  FIM - Upper Body Dressing/Undressing Upper body dressing/undressing steps patient completed: Put head through opening of pull over shirt/dress Upper body dressing/undressing: 2: Max-Patient completed 25-49% of tasks FIM - Lower Body Dressing/Undressing Lower body dressing/undressing: 1: Two helpers  FIM - Toileting Toileting: 0: Activity did not occur  FIM - Radio producer Devices: Recruitment consultant Transfers: 1-Two helpers  FIM - IT sales professional Transfer: 2: Supine > Sit: Max A (lifting assist/Pt. 25-49%);2: Sit > Supine: Max A (lifting assist/Pt. 25-49%);2: Bed > Chair or W/C: Max A (lift and lower assist);1: Bed > Chair or W/C: Total A (helper does all/Pt. < 25%);2: Chair or W/C > Bed: Max A (lift and lower assist);1: Chair or W/C > Bed: Total A (helper does all/Pt. < 25%)  FIM - Locomotion: Wheelchair Locomotion: Wheelchair: 3: Travels 150 ft or more: maneuvers on rugs and over door sills with moderate assistance  (Pt: 50 - 74%) FIM - Locomotion: Ambulation Locomotion: Ambulation Assistive Devices: Parallel bars Ambulation/Gait Assistance: 2: Max assist Locomotion:  Ambulation: 1: Travels less than 50 ft with maximal assistance (Pt: 25 - 49%)  Comprehension Comprehension Mode: Auditory Comprehension: 2-Understands basic 25 - 49%  of the time/requires cueing 51 - 75% of the time  Expression Expression Mode: Verbal Expression: 2-Expresses basic 25 - 49% of the time/requires cueing 50 - 75% of the time. Uses single words/gestures.  Social Interaction Social Interaction: 2-Interacts appropriately 25 - 49% of time - Needs frequent redirection.  Problem Solving Problem Solving: 2-Solves basic 25 - 49% of the time - needs direction more than half the time to initiate, plan or complete simple activities  Memory Memory: 1-Recognizes or recalls less than 25% of the time/requires cueing greater than 75% of the time   Medical Problem List and Plan:  Right temporal occipital metastatic tumor (SCLC)--  -XRT ongoing--?duration of therapy--5/14 treatments today -steroid taper--decadron  1. DVT Prophylaxis/Anticoagulation: Pharmaceutical: Lovenox  2. Pain Management: tylenol, hydrocodone  3. Mood: Resumed Lexapro. Team to provide emotional support to family and patient as appropriate 4. Neuropsych: This patient is not capable of making decisions on her own behalf.  5. FEN/malnutrition---D3 Nectars---   -eating 100% at present  -continue to push fluids as BUN appears to be drifting up 6. Sz prophylaxis---keppra  LOS (Days) 3 A FACE TO FACE EVALUATION WAS PERFORMED  Meredith Staggers 12/09/2013 7:58 AM

## 2013-12-09 NOTE — Progress Notes (Signed)
Patient information reviewed and entered into eRehab system by Breylon Sherrow, RN, CRRN, PPS Coordinator.  Information including medical coding and functional independence measure will be reviewed and updated through discharge.    

## 2013-12-10 ENCOUNTER — Inpatient Hospital Stay (HOSPITAL_COMMUNITY): Payer: BC Managed Care – PPO | Admitting: Speech Pathology

## 2013-12-10 ENCOUNTER — Encounter (HOSPITAL_COMMUNITY): Payer: BC Managed Care – PPO

## 2013-12-10 ENCOUNTER — Ambulatory Visit
Admit: 2013-12-10 | Discharge: 2013-12-10 | Disposition: A | Discharge status: Home / Self Care | Payer: BC Managed Care – PPO | Attending: Radiation Oncology | Admitting: Radiation Oncology

## 2013-12-10 ENCOUNTER — Inpatient Hospital Stay (HOSPITAL_COMMUNITY): Payer: BC Managed Care – PPO

## 2013-12-10 DIAGNOSIS — C7931 Secondary malignant neoplasm of brain: Secondary | ICD-10-CM

## 2013-12-10 DIAGNOSIS — C7949 Secondary malignant neoplasm of other parts of nervous system: Secondary | ICD-10-CM

## 2013-12-10 DIAGNOSIS — F341 Dysthymic disorder: Secondary | ICD-10-CM

## 2013-12-10 DIAGNOSIS — I10 Essential (primary) hypertension: Secondary | ICD-10-CM

## 2013-12-10 DIAGNOSIS — R4182 Altered mental status, unspecified: Secondary | ICD-10-CM

## 2013-12-10 LAB — GLUCOSE, CAPILLARY
GLUCOSE-CAPILLARY: 160 mg/dL — AB (ref 70–99)
GLUCOSE-CAPILLARY: 145 mg/dL — AB (ref 70–99)
Glucose-Capillary: 102 mg/dL — ABNORMAL HIGH (ref 70–99)
Glucose-Capillary: 118 mg/dL — ABNORMAL HIGH (ref 70–99)
Glucose-Capillary: 127 mg/dL — ABNORMAL HIGH (ref 70–99)
Glucose-Capillary: 146 mg/dL — ABNORMAL HIGH (ref 70–99)

## 2013-12-10 MED ORDER — METHYLPHENIDATE HCL 5 MG PO TABS
5.0000 mg | ORAL_TABLET | Freq: Two times a day (BID) | ORAL | Status: DC
  Administered 2013-12-10 – 2013-12-17 (×15): 5 mg via ORAL
  Filled 2013-12-10 (×15): qty 1

## 2013-12-10 MED ORDER — HYDROCERIN EX CREA
TOPICAL_CREAM | Freq: Two times a day (BID) | CUTANEOUS | Status: DC
  Administered 2013-12-10 – 2013-12-14 (×8): via TOPICAL
  Administered 2013-12-14: 1 via TOPICAL
  Administered 2013-12-15 – 2013-12-20 (×12): via TOPICAL
  Administered 2013-12-21 – 2013-12-22 (×3): 1 via TOPICAL
  Administered 2013-12-22 – 2013-12-27 (×11): via TOPICAL
  Administered 2013-12-28: 1 via TOPICAL
  Filled 2013-12-10: qty 113

## 2013-12-10 NOTE — Progress Notes (Signed)
Physical Therapy Session Note  Patient Details  Name: Katie [name] MRN: [id number] Date of Birth: [birth year]  Today's Date: 12/10/2013 Time: 6256-3893 Time Calculation (min): 65 min  Short Term Goals: Week 1:  PT Short Term Goal 1 (Week 1): Pt will increase bed mobility to mod A.  PT Short Term Goal 2 (Week 1): Pt will increase transfer bed to chair, chair to bed to mod A.  PT Short Term Goal 3 (Week 1): Pt will increase ambulation with LRAD to mod A about 25 feet.  PT Short Term Goal 4 (Week 1): Pt will ascend/descend 2 stairs with B rails and max A.  PT Short Term Goal 5 (Week 1): Pt will propel w/c about 100 feet with B LEs and min A.   Skilled Therapeutic Interventions/Progress Updates:  1:1. Pt received sitting in w/c, ready for therapy. Brief found to be soiled, max A to maintaining standing while RN providing total A for clean up and brief change. Focus this session on arousal/alertness, following 1 step commands, sustained attention, w/c propulsion and ambulation. Pt req min-mod A for w/c propulsion x100' w/ max cueing to sustain attention to task and attend to B LE (L>R). Throughout session, pt req max encouragement as well as multimodal cues to stand due to complaints of back pain. However, once standing pt able to demonstrate amb x150' and 30' w/ variable level of assist dependent upon fatigue and attention. Pt can req Ax2 persons (3 musketeer) to mod A x1 person (L arm over therapists shoulder), however, pt attempts to sit without warning so req close w/c follow for safety. Stair negotiation attempted, but pt consistently attempting to sit on stairs. Pt engaged in seated horseshoe toss in quiet environment to target sustained attention to task req max multimodal cues to find horseshoes on L side. Pt demonstrating progressive fatigue throughout session w/ difficulty maintaining EO. Pt demonstrating ability to follow 1 step commands approx 25% of time due to decreased attention therefore  req consistent max multimodal cueing throughout session for redirection to tasks. [name] niece educated on impairments observed during tx session. Pt sitting in w/c at end of session w/ all needs in reach, quick release belt in place and niece in room.   Therapy Documentation Precautions:  Precautions Precautions: Fall Precaution Comments: impulsive, decreased safety awareness Restrictions Weight Bearing Restrictions: No General: Amount of Missed PT Time (min): 10 Minutes Missed Time Reason: Patient fatigue  See FIM for current functional status  Therapy/Group: Individual Therapy  Gilmore Laroche 12/10/2013, 11:58 AM

## 2013-12-10 NOTE — Progress Notes (Signed)
[place] PHYSICAL MEDICINE & REHABILITATION     PROGRESS NOTE    Subjective/Complaints: No pain or new complaints. Slept well again Ros limited due to cognition/language.  Objective: Vital Signs: Blood pressure 112/65, pulse 66, temperature 97.8 F (36.6 C), temperature source Oral, resp. rate 16, height 5\' 7"  (1.702 m), weight 57.425 kg (126 lb 9.6 oz), SpO2 97.00%. No results found. No results found for this basename: WBC, HGB, HCT, PLT,  in the last 72 hours No results found for this basename: NA, K, CL, CO, GLUCOSE, BUN, CREATININE, CALCIUM,  in the last 72 hours CBG (last 3)   Recent Labs  12/10/13 0032 12/10/13 0409 12/10/13 0712  GLUCAP 146* 127* 102*    Wt Readings from Last 3 Encounters:  12/09/13 57.425 kg (126 lb 9.6 oz)  12/03/13 54.4 kg (119 lb 14.9 oz)  12/03/13 54.4 kg (119 lb 14.9 oz)    Physical Exam:  Constitutional: No distress.  Frail appearing, alert  HENT:  Head: Normocephalic and atraumatic.  Right Ear: External ear normal.  Left Ear: External ear normal.  Eyes: Conjunctivae and EOM are normal. Pupils are equal, round, and reactive to light.  Neck: No JVD present. No tracheal deviation present. No thyromegaly present.  Cardiovascular: Normal rate and regular rhythm. Exam reveals friction rub. Exam reveals no gallop.  No murmur heard.  Respiratory: No respiratory distress. She has no wheezes. She has no rales. She exhibits no tenderness.  GI: She exhibits no distension. There is no tenderness. There is no rebound.  Lymphadenopathy:  She has no cervical adenopathy.  Neurological:  Attention waxes and wanes. Still confused. Stated she was at [facility]. Continued receptive and expressive language deficits as well. Language of confusions. Typically keeps eyes closed unless cued. moves all 4 limbs. Senses pain. CN grossly intact  Skin: Skin is warm. Op site intact with some fluid still under skin---no real change Psychiatric:    confused   Assessment/Plan: 1. 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 Physiatrist is providing close team supervision and 24 hour management of active medical problems listed below. Physiatrist and rehab team continue to assess barriers to discharge/monitor patient progress toward functional and medical goals. FIM: FIM - Bathing Bathing Steps Patient Completed: Chest;Right Arm;Left Arm;Abdomen;Front perineal area;Right upper leg;Left upper leg Bathing: 3: Mod-Patient completes 5-7 71f 10 parts or 50-74%  FIM - Upper Body Dressing/Undressing Upper body dressing/undressing steps patient completed: Thread/unthread right sleeve of pullover shirt/dresss;Put head through opening of pull over shirt/dress;Pull shirt over trunk Upper body dressing/undressing: 4: Min-Patient completed 75 plus % of tasks FIM - Lower Body Dressing/Undressing Lower body dressing/undressing steps patient completed: Don/Doff left sock;Don/Doff right sock Lower body dressing/undressing: 1: Two helpers  FIM - Toileting Toileting: 1: Total-Patient completed zero steps, helper did all 3  FIM - Radio producer Devices: Bedside commode Toilet Transfers: 2-To toilet/BSC: Max A (lift and lower assist);2-From toilet/BSC: Max A (lift and lower assist)  FIM - Engineer, site Assistive Devices: Arm rests Bed/Chair Transfer: 2: Sit > Supine: Max A (lifting assist/Pt. 25-49%);2: Bed > Chair or W/C: Max A (lift and lower assist);2: Chair or W/C > Bed: Max A (lift and lower assist)  FIM - Locomotion: Wheelchair Locomotion: Wheelchair: 2: Travels 150 ft or more: maneuvers on rugs and over door sills with maximal assistance (Pt: 25 - 49%) FIM - Locomotion: Ambulation Locomotion:  Ambulation Assistive Devices: Parallel bars Ambulation/Gait Assistance: 2: Max  assist Locomotion: Ambulation: 0: Activity did not occur  Comprehension Comprehension Mode: Auditory Comprehension: 1-Understands basic less than 25% of the time/requires cueing 75% of the time  Expression Expression Mode: Verbal Expression: 1-Expresses basis less than 25% of the time/requires cueing greater than 75% of the time.  Social Interaction Social Interaction: 2-Interacts appropriately 25 - 49% of time - Needs frequent redirection.  Problem Solving Problem Solving: 1-Solves basic less than 25% of the time - needs direction nearly all the time or does not effectively solve problems and may need a restraint for safety  Memory Memory: 1-Recognizes or recalls less than 25% of the time/requires cueing greater than 75% of the time   Medical Problem List and Plan:  Right temporal occipital metastatic tumor (SCLC)--  -XRT ongoing--?duration of therapy--6/14 treatments today -steroid taper--decadron  1. DVT Prophylaxis/Anticoagulation: Pharmaceutical: Lovenox  2. Pain Management: tylenol, hydrocodone  3. Mood: Resumed Lexapro. Team to provide emotional support to family and patient as appropriate 4. Neuropsych: This patient is not capable of making decisions on her own behalf.   -add ritalin to see if it helps with attention/focus 5. FEN/malnutrition---D3 Nectars---   -eating 100% at present  -continue to push fluids as BUN appears to be drifting up--spoke with husband 6. Sz prophylaxis---keppra  LOS (Days) 4 A FACE TO FACE EVALUATION WAS PERFORMED  Meredith Staggers 12/10/2013 7:39 AM

## 2013-12-10 NOTE — Progress Notes (Signed)
Occupational Therapy Session Note  Patient Details  Name: Katie [name] MRN: [id number] Date of Birth: April 01, 1951  Today's Date: 12/10/2013 Time: 0900-1012 Time Calculation (min): 72 min  Short Term Goals: Week 1:  OT Short Term Goal 1 (Week 1): Patient will complete wheelchair to toilet transfer with moderate assistance. OT Short Term Goal 2 (Week 1): Patient will groom at the sink with minimum assistance. OT Short Term Goal 3 (Week 1): Patient will complete upper body dressing with moderate assistance. OT Short Term Goal 4 (Week 1): Patient will complete lower body dressing with maximum assistance. OT Short Term Goal 5 (Week 1): Patient will complete toileting with maximum assistance.  Skilled Therapeutic Interventions/Progress Updates:    Pt engaged in BADL retraining including bathing and dressing with sit<>stand from w/c at sink.  [name] husband and niece present for therapy.  [name] husband left at beginning of session but niece remained.  Pt required max verbal cues for task initiation, sequencing, and redirection to task. Pt required max verbal cues to stop talking and follow instrucitons for bating and dressing.  Pt continues to exhibit increased difficulty with locating items and visually attending to objects.  Pt did not visually attend to clothing items when dressing and required tactile cues to initiate tasks.  Pt required max A for sit<>stand and max A for standing at sink.  Pt would sit without notice and required tot A for stand->sit. Focus on activity tolerance, attention to task, bed mobility, transfers, sit<>stand, standing balance, sequencing, and safety awareness.   Therapy Documentation Precautions:  Precautions Precautions: Fall Precaution Comments: impulsive, decreased safety awareness Restrictions Weight Bearing Restrictions: No Pain: Pain Assessment Pain Assessment: No/denies pain  See FIM for current functional status  Therapy/Group: Individual Therapy  Leroy Libman 12/10/2013, 10:18 AM

## 2013-12-10 NOTE — Progress Notes (Signed)
The skilled treatment note has been reviewed and SLP is in agreement.  Hartley Wyke, M.A., CCC-SLP  [phone]   

## 2013-12-10 NOTE — Progress Notes (Signed)
Speech Language Pathology Daily Session Note  Patient Details  Name: Katie [name] MRN: [id number] Date of Birth: 08-22-1951  Today's Date: 12/10/2013 Time: 8101-7510 Time Calculation (min): 46 min  Short Term Goals: Week 1: SLP Short Term Goal 1 (Week 1): Patient will consume PO with Mod level verbal cues for pacing and portion control. SLP Short Term Goal 2 (Week 1): Patient will sustain attention to task for 1 minute with Mod verbal cues for redirection. SLP Short Term Goal 3 (Week 1): Patient will utilize external aids for orientation with Max assist.  SLP Short Term Goal 4 (Week 1): Patient will label 2 deficits with Max clinician cues.  SLP Short Term Goal 5 (Week 1): Patient will solve basic problems with Mod assist. SLP Short Term Goal 6 (Week 1): Patient maintain topic of conversation for 2 turns with Max clinician cues.  Skilled Therapeutic Interventions: Skilled therapeutic intervention addressed cognitive goals. Student provided Max multimodal cues for orientation to place and date and to sustain attention for ~1 minute to complete a basic task. Patient with language of confusion throughout session which impacts her sustained attention. Attempts of decreasing verbosity during functional tasks were unsuccessful, however, patient with increased topic maintenance. Patient required Mod verbal cues to keep eyes open during session and Max A for utilization of external aids to increase recall of functional information and utilization of call bell. Continue with current plan of care.    FIM:  Comprehension Comprehension Mode: Auditory Comprehension: 2-Understands basic 25 - 49% of the time/requires cueing 51 - 75% of the time Expression Expression Mode: Verbal Expression: 1-Expresses basis less than 25% of the time/requires cueing greater than 75% of the time. Social Interaction Social Interaction: 2-Interacts appropriately 25 - 49% of time - Needs frequent redirection. Problem  Solving Problem Solving: 1-Solves basic less than 25% of the time - needs direction nearly all the time or does not effectively solve problems and may need a restraint for safety Memory Memory: 1-Recognizes or recalls less than 25% of the time/requires cueing greater than 75% of the time FIM - Eating Eating Activity: 0: Activity did not occur  Pain Pain Assessment Pain Assessment: No/denies pain  Therapy/Group: Individual Therapy  Danene Montijo 12/10/2013, 9:32 AM

## 2013-12-11 ENCOUNTER — Inpatient Hospital Stay (HOSPITAL_COMMUNITY): Payer: BC Managed Care – PPO | Admitting: Speech Pathology

## 2013-12-11 ENCOUNTER — Encounter (HOSPITAL_COMMUNITY): Payer: BC Managed Care – PPO | Admitting: Occupational Therapy

## 2013-12-11 ENCOUNTER — Ambulatory Visit (HOSPITAL_COMMUNITY): Payer: BC Managed Care – PPO | Admitting: *Deleted

## 2013-12-11 ENCOUNTER — Ambulatory Visit
Admit: 2013-12-11 | Discharge: 2013-12-11 | Disposition: A | Discharge status: Home / Self Care | Payer: BC Managed Care – PPO | Attending: Radiation Oncology | Admitting: Radiation Oncology

## 2013-12-11 DIAGNOSIS — C7949 Secondary malignant neoplasm of other parts of nervous system: Secondary | ICD-10-CM

## 2013-12-11 DIAGNOSIS — R4182 Altered mental status, unspecified: Secondary | ICD-10-CM

## 2013-12-11 DIAGNOSIS — I10 Essential (primary) hypertension: Secondary | ICD-10-CM

## 2013-12-11 DIAGNOSIS — F341 Dysthymic disorder: Secondary | ICD-10-CM

## 2013-12-11 DIAGNOSIS — C7931 Secondary malignant neoplasm of brain: Secondary | ICD-10-CM

## 2013-12-11 LAB — GLUCOSE, CAPILLARY
GLUCOSE-CAPILLARY: 151 mg/dL — AB (ref 70–99)
GLUCOSE-CAPILLARY: 152 mg/dL — AB (ref 70–99)
GLUCOSE-CAPILLARY: 91 mg/dL (ref 70–99)
Glucose-Capillary: 133 mg/dL — ABNORMAL HIGH (ref 70–99)
Glucose-Capillary: 115 mg/dL — ABNORMAL HIGH (ref 70–99)
Glucose-Capillary: 119 mg/dL — ABNORMAL HIGH (ref 70–99)

## 2013-12-11 NOTE — Progress Notes (Signed)
Physical Therapy Session Note  Patient Details  Name: Katie [name] MRN: [id number] Date of Birth: 02-Dec-1950  Today's Date: 12/11/2013 Time: 4734-0370 Time Calculation (min): 55 min  Short Term Goals: Week 1:  PT Short Term Goal 1 (Week 1): Pt will increase bed mobility to mod A.  PT Short Term Goal 2 (Week 1): Pt will increase transfer bed to chair, chair to bed to mod A.  PT Short Term Goal 3 (Week 1): Pt will increase ambulation with LRAD to mod A about 25 feet.  PT Short Term Goal 4 (Week 1): Pt will ascend/descend 2 stairs with B rails and max A.  PT Short Term Goal 5 (Week 1): Pt will propel w/c about 100 feet with B LEs and min A.   Skilled Therapeutic Interventions/Progress Updates:  Pt received semi-reclined in bed, ready for therapy. Co-tx with recreational therapist this session for focus on sustained attention to functional tasks, initiation and following 1 step commands. Pt req max multimodal cueing and frequent redirection to tasks due to poor sustained attention and language of confusion. [name] husband asked to step out of pt's field of view at times for improved attention to tasks. Pt req mod A for w/c propulsion 150-175'x3 w/ B LE (manual facilitation for consistent advancement of L LE). Pt with good tolerance to amb x100' w/ mod-max A, L UE placed over therapists shoulder and therapist facilitating hip ext and B weight shifts. Recreational therapist holding pt's R hand while amb for guiding and husband following behind in w/c. Stair negotiation attempted again, however, pt declining to stand due to fatigue. In [facility] gym, pt performed sorting card task into suites and colors (red vs. Black) to target attention. Once task initiated, pt demonstrated fair-good sustained attention x78min with min-mod cueing. Pt sitting in w/c at end of session w/ all needs in reach, quick release belt in place and husband in room.   Therapy Documentation Precautions:  Precautions Precautions:  Fall Precaution Comments: impulsive, decreased safety awareness Restrictions Weight Bearing Restrictions: No  See FIM for current functional status  Therapy/Group: Individual Therapy  Gilmore Laroche 12/11/2013, 9:29 AM

## 2013-12-11 NOTE — Progress Notes (Signed)
Speech Language Pathology Daily Session Note  Patient Details  Name: Katie [name] MRN: [id number] Date of Birth: 08/18/1951  Today's Date: 12/11/2013 Time: 1165-7903 Time Calculation (min): 43 min  Short Term Goals: Week 1: SLP Short Term Goal 1 (Week 1): Patient will consume PO with Mod level verbal cues for pacing and portion control. SLP Short Term Goal 2 (Week 1): Patient will sustain attention to task for 1 minute with Mod verbal cues for redirection. SLP Short Term Goal 3 (Week 1): Patient will utilize external aids for orientation with Max assist.  SLP Short Term Goal 4 (Week 1): Patient will label 2 deficits with Max clinician cues.  SLP Short Term Goal 5 (Week 1): Patient will solve basic problems with Mod assist. SLP Short Term Goal 6 (Week 1): Patient maintain topic of conversation for 2 turns with Max clinician cues.  Skilled Therapeutic Interventions: Skilled treatment session focused on dysphagia and cognitive-linguistic goals. SLP facilitated session by providing Mod A verbal and tactile cues for thorough oral care prior to trials of thin liquids. Pt also required Mod A for utilization of small sips with trials of thin liquids via cup.  Pt demonstrated overt s/s of aspiration with 2/15 trials. Due to h/o silent aspiration, recommend repeat MBS to assess for possible upgrade.  SLP also facilitated session by providing Mod verbal cues for topic maintenance for 2-3 minutes during a structured task. Pt also required Mod-Max A for generative naming during a structured task.  Continue with current plan of care.    FIM:  Comprehension Comprehension Mode: Auditory Comprehension: 1-Understands basic less than 25% of the time/requires cueing 75% of the time Expression Expression Mode: Verbal Expression: 1-Expresses basis less than 25% of the time/requires cueing greater than 75% of the time. Social Interaction Social Interaction: 2-Interacts appropriately 25 - 49% of time - Needs  frequent redirection. Problem Solving Problem Solving: 1-Solves basic less than 25% of the time - needs direction nearly all the time or does not effectively solve problems and may need a restraint for safety Memory Memory: 1-Recognizes or recalls less than 25% of the time/requires cueing greater than 75% of the time FIM - Eating Eating Activity: 4: Help with picking up utensils  Pain Pain Assessment Pain Assessment: No/denies pain  Therapy/Group: Individual Therapy  Buzzy Han 12/11/2013, 12:19 PM

## 2013-12-11 NOTE — Progress Notes (Signed)
[facility] PHYSICAL MEDICINE & REHABILITATION     PROGRESS NOTE    Subjective/Complaints: Slept fairly well. No complaints. Denies visual problems,pain Ros limited due to cognition/language.  Objective: Vital Signs: Blood pressure 127/70, pulse 77, temperature 98.3 F (36.8 C), temperature source Oral, resp. rate 17, height 5\' 7"  (1.702 m), weight 57.425 kg (126 lb 9.6 oz), SpO2 95.00%. No results found. No results found for this basename: WBC, HGB, HCT, PLT,  in the last 72 hours No results found for this basename: NA, K, CL, CO, GLUCOSE, BUN, CREATININE, CALCIUM,  in the last 72 hours CBG (last 3)   Recent Labs  12/11/13 0003 12/11/13 0423 12/11/13 0722  GLUCAP 151* 133* 115*    Wt Readings from Last 3 Encounters:  12/09/13 57.425 kg (126 lb 9.6 oz)  12/03/13 54.4 kg (119 lb 14.9 oz)  12/03/13 54.4 kg (119 lb 14.9 oz)    Physical Exam:  Constitutional: No distress.  Frail appearing, alert  HENT:  Head: Normocephalic and atraumatic.  Right Ear: External ear normal.  Left Ear: External ear normal.  Eyes: Conjunctivae and EOM are normal. Pupils are equal, round, and reactive to light.  Neck: No JVD present. No tracheal deviation present. No thyromegaly present.  Cardiovascular: Normal rate and regular rhythm. Exam reveals friction rub. Exam reveals no gallop.  No murmur heard.  Respiratory: No respiratory distress. She has no wheezes. She has no rales. She exhibits no tenderness.  GI: She exhibits no distension. There is no tenderness. There is no rebound.  Lymphadenopathy:  She has no cervical adenopathy.  Neurological:  Attention waxes and wanes. Still confused.  .  Language of confusion. Typically keeps eyes closed unless cued. moves all 4 limbs. Senses pain. CN grossly intact  Skin: Skin is warm. Op site intact with some fluid still under skin---no real change Psychiatric:   confused   Assessment/Plan: 1. 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 Physiatrist is providing close team supervision and 24 hour management of active medical problems listed below. Physiatrist and rehab team continue to assess barriers to discharge/monitor patient progress toward functional and medical goals. FIM: FIM - Bathing Bathing Steps Patient Completed: Chest;Right Arm;Left Arm;Abdomen;Front perineal area;Right upper leg;Left upper leg Bathing: 3: Mod-Patient completes 5-7 33f 10 parts or 50-74%  FIM - Upper Body Dressing/Undressing Upper body dressing/undressing steps patient completed: Put head through opening of pull over shirt/dress Upper body dressing/undressing: 2: Max-Patient completed 25-49% of tasks FIM - Lower Body Dressing/Undressing Lower body dressing/undressing steps patient completed: Don/Doff left sock;Don/Doff right sock Lower body dressing/undressing: 1: Total-Patient completed less than 25% of tasks  FIM - Toileting Toileting: 1: Total-Patient completed zero steps, helper did all 3  FIM - Radio producer Devices: Bedside commode Toilet Transfers: 2-To toilet/BSC: Max A (lift and lower assist);2-From toilet/BSC: Max A (lift and lower assist)  FIM - Control and instrumentation engineer Devices: Arm rests Bed/Chair Transfer: 2: Bed > Chair or W/C: Max A (lift and lower assist);2: Chair or W/C > Bed: Max A (lift and lower assist)  FIM - Locomotion: Wheelchair Locomotion: Wheelchair: 2: Travels 50 - 149 ft with moderate assistance (Pt: 50 - 74%) FIM - Locomotion: Ambulation Locomotion: Ambulation Assistive Devices: Other (comment) (rail in hall) Ambulation/Gait Assistance: 1: +1 Total assist;2: Max assist;3: Mod assist Locomotion: Ambulation: 1: Two helpers  Comprehension Comprehension Mode: Auditory Comprehension: 1-Understands  basic less than 25% of the time/requires cueing  75% of the time  Expression Expression Mode: Verbal Expression: 2-Expresses basic 25 - 49% of the time/requires cueing 50 - 75% of the time. Uses single words/gestures.  Social Interaction Social Interaction: 2-Interacts appropriately 25 - 49% of time - Needs frequent redirection.  Problem Solving Problem Solving: 1-Solves basic less than 25% of the time - needs direction nearly all the time or does not effectively solve problems and may need a restraint for safety  Memory Memory: 1-Recognizes or recalls less than 25% of the time/requires cueing greater than 75% of the time   Medical Problem List and Plan:  Right temporal occipital metastatic tumor (SCLC)--  -XRT ongoing--?duration of therapy--6/14 treatments today -steroid taper--decadron  1. DVT Prophylaxis/Anticoagulation: Pharmaceutical: Lovenox  2. Pain Management: tylenol, hydrocodone  3. Mood: Resumed Lexapro. Team to provide emotional support to family and patient as appropriate 4. Neuropsych: This patient is not capable of making decisions on her own behalf.   -added ritalin for attention/focus 5. FEN/malnutrition---D3 Nectars--- f/u swallow study with SLP  -eating 100% at present  -continue to push fluids as BUN appears to be drifting up--spoke with husband 6. Sz prophylaxis---keppra  LOS (Days) 5 A FACE TO FACE EVALUATION WAS PERFORMED  Meredith Staggers 12/11/2013 7:51 AM

## 2013-12-11 NOTE — Progress Notes (Signed)
Recreational Therapy Session Note  Patient Details  Name: Mariaelena Cade MRN: [id number] Date of Birth: December 07, 1950 Today's Date: 12/11/2013  Pain: no c/o Skilled Therapeutic Interventions/Progress Updates: Session focused on activity tolerance, sustained attention, w/c mobility, problem solving & visual scanning.  Pt performed w/c mobility with min-mod assist using BLE's for propulsion.  PT sustained attention to sort playing cards into piles by color or by suit with mod-max cues ~10 minutes.  [name] husband present during session  Therapy/Group: Co-Treatment  [name] 12/11/2013, 3:41 PM

## 2013-12-11 NOTE — Progress Notes (Signed)
Occupational Therapy Session Note  Patient Details  Name: Katie [name] MRN: [id number] Date of Birth: 02/22/1951  Today's Date: 12/11/2013 Time: 1015 (Simultaneous filing. -1115 Time Calculation (min): 60 min   Short Term Goals: Week 1:  OT Short Term Goal 1 (Week 1): Patient will complete wheelchair to toilet transfer with moderate assistance. OT Short Term Goal 2 (Week 1): Patient will groom at the sink with minimum assistance. OT Short Term Goal 3 (Week 1): Patient will complete upper body dressing with moderate assistance. OT Short Term Goal 4 (Week 1): Patient will complete lower body dressing with maximum assistance. OT Short Term Goal 5 (Week 1): Patient will complete toileting with maximum assistance.  Skilled Therapeutic Interventions/Progress Updates:    1:1 self care retraining at sink level. Focus on sustained attention to task with max cuing and allowing more than reasonable amt of time for response , toilet transfer, toileting sit to stand, sit to stands at the sink, following one step commands with contextual and environmental cues, initiation with max tactile and verbal cues for all bathing and dressing tasks, visual attention to the left, task organization/ sequencing. [name] husband present for session and frequently provided verbal cues to pt and pt require frequent reassurance of his presence in the session. Pt perform basic scoot pivot transfer w/c<> toilet with max A and sit to stands with max cuing with husband's encouragement for initiation .  Pt continues to demonstrate language of confusion.   Therapy Documentation Precautions:  Precautions Precautions: Fall Precaution Comments: impulsive, decreased safety awareness Restrictions Weight Bearing Restrictions: No    Pain:  no c/o pain   See FIM for current functional status  Therapy/Group: Individual Therapy  Merrilee Seashore 12/11/2013, 12:06 PM

## 2013-12-11 NOTE — Plan of Care (Signed)
Problem: RH BLADDER ELIMINATION Goal: RH STG MANAGE BLADDER WITH ASSISTANCE STG Manage Bladder With Mod Assistance  Outcome: Not Progressing Unaware of incontinent episodes .requires toileting

## 2013-12-11 NOTE — Patient Care Conference (Signed)
Inpatient RehabilitationTeam Conference and Plan of Care Update Date: 12/10/2013   Time: 2:45  PM    Patient Name: Katie [name]      Medical Record Number: [id number]  Date of Birth: 12/27/1950 Sex: Female         Room/Bed: 4W02C/4W02C-01 Payor Info: Payor: [employer] / Plan: [employer] [place] PPO / Product Type: *No Product type* /    Admitting Diagnosis: LUNG CA WITH BRAIN METS  Admit Date/Time:  12/06/2013  4:05 PM Admission Comments: No comment available   Primary Diagnosis:  <principal problem not specified> Principal Problem: <principal problem not specified>  Patient Active Problem List   Diagnosis Date Noted  . Brain tumor 12/06/2013  . 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 Malnutrition of moderate degree 11/19/2013  . Small cell lung cancer 11/18/2013  . HTN (hypertension) 11/18/2013  . Steroid-induced hyperglycemia 11/18/2013  . Fall with head trauma 11/18/2013  . Underweight 11/18/2013  . Protein-calorie malnutrition, severe 11/18/2013  . 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 brain metastases 11/15/2013  . Acute respiratory failure 11/12/2013  . Altered mental status 11/12/2013  . Syncope secondary to brain metastasis 11/11/2013    Expected Discharge Date: Expected Discharge Date: 01/01/14  Team Members Present: Physician leading conference: Dr. Alger Simons Social Worker Present: Lennart Pall, LCSW Nurse Present: Elliot Cousin, RN PT Present: Melene Plan, Cottie Banda, PT OT Present: Roanna Epley, [place], OT SLP Present: Weston Anna, SLP PPS Coordinator present : Daiva Nakayama, RN, CRRN;Becky Alwyn Ren, PT     Current Status/Progress Goal Weekly Team Focus  Medical   lung mets to the brain. confusion/language issues. mets in multiple areas of the brain  improve activity tolerance, comprehension/awareness  attention/energy levels, language/cognition, nutrition   Bowel/Bladder   pt incontinent of bowel and bladder. Attempting timed  toleiting with fewer bladder incont. episodes.   manage bowel and bladder with minimal assist. Call for use of bathroom  contiune timed toileting.    Swallow/Nutrition/ Hydration   Dys. 3 textures with nectar thick liquids with supervision  Supervision for use of safe swallow strategies  possible initiation of water protocol, diet tolerance   ADL's   max A overall, max A transfers, decreased attention, limited activity tolerance  min A/supervision overall  activity tolerance, attention to task, transfers, sit<>stand, standing balance,family education   Mobility   Min A for sitting balance; Max A for standing balance, bed mobility, squat/stand pivot transfers, sustained attention, w/c propulsion  Supervision to Min A  functional endurance, attention to task, transfers (squat/stand pivot and sit<>stand), standing balance, w/c propulsion, family education   Communication   severe language of confusion  Min A to express wants/needs  increase awareness of verbosity to decrease language of confustion   Safety/Cognition/ Behavioral Observations  Max-Total for sustained attention for ~1 minute, orientation, and use of external aids to assist in recall of functional information  Mod A  sustained attention, orientation, use of external aids to assist in recall of functional information with Mod-Max   Pain   no complaints of pain         Skin   right chest portacath inplace, sacrum reddened but blanchable with EPBC applied. incision to right posterior sclap with sutures in bottom in incision. scattered bruising noted  remain free of skin breakdown and free from infection  assess skin qshift, turn pt q2-3 hrs and keep pt dry    Rehab Goals Patient on target to meet rehab goals: Yes *See Care Plan and  progress notes for long and short-term goals.  Barriers to Discharge: severe cognitiive/language deficits    Possible Resolutions to Barriers:  NMR, cognitive perceptual rx, family ed    Discharge  Planning/Teaching Needs:  home with husband and other family providing 24/7 assistance      Team Discussion:  Significant language of confusion per ST.  Poor prognosis overall.  Min assist goals.  Currently nectar thick liquids but hope to upgrade soon.  Family very supportive.  Poor activity tolerance and need to decrease tx frequency.  Revisions to Treatment Plan:  Change to 15/7 tx schedule.   Continued Need for Acute Rehabilitation Level of Care: The patient requires daily medical management by a physician with specialized training in physical medicine and rehabilitation for the following conditions: Daily direction of a multidisciplinary physical rehabilitation program to ensure safe treatment while eliciting the highest outcome that is of practical value to the patient.: Yes Daily medical management of patient stability for increased activity during participation in an intensive rehabilitation regime.: Yes Daily analysis of laboratory values and/or radiology reports with any subsequent need for medication adjustment of medical intervention for : Post surgical problems;Neurological problems  Lennart Pall 12/11/2013, 12:16 PM

## 2013-12-12 ENCOUNTER — Inpatient Hospital Stay (HOSPITAL_COMMUNITY): Payer: BC Managed Care – PPO | Admitting: Speech Pathology

## 2013-12-12 ENCOUNTER — Encounter (HOSPITAL_COMMUNITY): Payer: BC Managed Care – PPO

## 2013-12-12 ENCOUNTER — Inpatient Hospital Stay (HOSPITAL_COMMUNITY): Payer: BC Managed Care – PPO | Admitting: *Deleted

## 2013-12-12 ENCOUNTER — Ambulatory Visit
Admit: 2013-12-12 | Discharge: 2013-12-12 | Disposition: A | Discharge status: Home / Self Care | Payer: BC Managed Care – PPO | Attending: Radiation Oncology | Admitting: Radiation Oncology

## 2013-12-12 ENCOUNTER — Inpatient Hospital Stay (HOSPITAL_COMMUNITY): Payer: BC Managed Care – PPO

## 2013-12-12 LAB — GLUCOSE, CAPILLARY
GLUCOSE-CAPILLARY: 95 mg/dL (ref 70–99)
GLUCOSE-CAPILLARY: 130 mg/dL — AB (ref 70–99)
Glucose-Capillary: 103 mg/dL — ABNORMAL HIGH (ref 70–99)
Glucose-Capillary: 108 mg/dL — ABNORMAL HIGH (ref 70–99)
Glucose-Capillary: 129 mg/dL — ABNORMAL HIGH (ref 70–99)
Glucose-Capillary: 131 mg/dL — ABNORMAL HIGH (ref 70–99)

## 2013-12-12 MED ORDER — HEPARIN SOD (PORK) LOCK FLUSH 100 UNIT/ML IV SOLN
500.0000 [IU] | INTRAVENOUS | Status: DC
  Filled 2013-12-12: qty 5

## 2013-12-12 MED ORDER — HEPARIN SOD (PORK) LOCK FLUSH 100 UNIT/ML IV SOLN
500.0000 [IU] | INTRAVENOUS | Status: DC | PRN
  Administered 2013-12-12: 500 [IU]
  Filled 2013-12-12: qty 5

## 2013-12-12 NOTE — Progress Notes (Signed)
Social Work Patient ID: Katie [name], female   DOB: October 16, 1950, 63 y.o.   MRN: [id number]  Have reviewed team conference information with pt's husband.  He is aware and agreeable with targeted d/c date of 5/13 with min assist goals overall.  Husband feels he is seeing some slight improvement in pt's communication.  Very supportive and encouraging of pt. Will continue to follow for support and d/c planning.  Lennart Pall, LCSW

## 2013-12-12 NOTE — Progress Notes (Signed)
Occupational Therapy Session Note  Patient Details  Name: Katie [name] MRN: [id number] Date of Birth: May 10, 1951  Today's Date: 12/12/2013 Time: 0800-0900 Time Calculation (min): 60 min  Short Term Goals: Week 1:  OT Short Term Goal 1 (Week 1): Patient will complete wheelchair to toilet transfer with moderate assistance. OT Short Term Goal 2 (Week 1): Patient will groom at the sink with minimum assistance. OT Short Term Goal 3 (Week 1): Patient will complete upper body dressing with moderate assistance. OT Short Term Goal 4 (Week 1): Patient will complete lower body dressing with maximum assistance. OT Short Term Goal 5 (Week 1): Patient will complete toileting with maximum assistance.  Skilled Therapeutic Interventions/Progress Updates:    Pt eating breakfast in bed with assistance from husband.  Pt engaged in BADLs with sit<>stand from w/c at sink.  Pt continues to require max verbal cues to initiate tasks and redirect to task.  [name] conversation is tangential and patient occasionally exhibits language of confusion. [name] husband remained in room during session and offered appropriate verbal cues throughout session. Pt required mod A for squat pivot transfer from bed->w/c and mod A for sit<>stand at sink.  Pt exhibits posterior lean when standing and requires max A to maintain standing balance.  Focus on bed mobility, transfers, sit<>stand, standing balance, task initiation, attention to task, safety awareness, and activity tolerance.  Therapy Documentation Precautions:  Precautions Precautions: Fall Precaution Comments: impulsive, decreased safety awareness Restrictions Weight Bearing Restrictions: No Pain: Pain Assessment Pain Assessment: No/denies pain  See FIM for current functional status  Therapy/Group: Individual Therapy  Leroy Libman 12/12/2013, 9:05 AM

## 2013-12-12 NOTE — Progress Notes (Signed)
Physical Therapy Session Note  Patient Details  Name: Katie [name] MRN: [id number] Date of Birth: [birth year]  Today's Date: 12/12/2013 Time: 1100-1150 Time Calculation (min): 50 min  Short Term Goals: Week 1:  PT Short Term Goal 1 (Week 1): Pt will increase bed mobility to mod A.  PT Short Term Goal 2 (Week 1): Pt will increase transfer bed to chair, chair to bed to mod A.  PT Short Term Goal 3 (Week 1): Pt will increase ambulation with LRAD to mod A about 25 feet.  PT Short Term Goal 4 (Week 1): Pt will ascend/descend 2 stairs with B rails and max A.  PT Short Term Goal 5 (Week 1): Pt will propel w/c about 100 feet with B LEs and min A.   Skilled Therapeutic Interventions/Progress Updates:    Patient received sitting in wheelchair, husband present. Session focused on initiation, sustained attention, and following one step commands during functional activities. Patient requires max multimodal cueing and constant redirection to tasks throughout session due to poor sustained attention and verbal perseveration. Wheelchair mobility x60' with B LE and minA for negotiation of obstacles, max cues for initiation of task and sustained attention to task. Stair negotiation x3 stairs with +2 assist, modA from therapist and HHA from husband, max cues for sustained attention and sequencing. Gait training in controlled environment 75' x1 and 130' x1 with +2 assist (3 musketeer style with each of patient's UEs around helper's shoulders) with emphasis on maintaining eyes open, following one step commands, and way finding to RN station to get ice cream, and then to room. Assisted patient in self-feeding and appropriate bite size with emphasis on opening eyes to look at each spoonful. Patient left sitting in wheelchair with seatbelt donned and all needs within reach, husband present.  Therapy Documentation Precautions:  Precautions Precautions: Fall Precaution Comments: impulsive, decreased safety  awareness Restrictions Weight Bearing Restrictions: No Pain: Pain Assessment Pain Assessment: No/denies pain Pain Score: 0-No pain  See FIM for current functional status  Therapy/Group: Individual Therapy  Lillia Abed. Ziasia Lenoir, PT, DPT 12/12/2013, 12:04 PM

## 2013-12-12 NOTE — Progress Notes (Signed)
[facility] PHYSICAL MEDICINE & REHABILITATION     PROGRESS NOTE    Subjective/Complaints: Slept well again. Awake and alert this am. Needs to go to the bathroom Ros limited due to cognition/language.  Objective: Vital Signs: Blood pressure 123/61, pulse 69, temperature 98.3 F (36.8 C), temperature source Oral, resp. rate 17, height 5\' 7"  (1.702 m), weight 57.425 kg (126 lb 9.6 oz), SpO2 92.00%. No results found. No results found for this basename: WBC, HGB, HCT, PLT,  in the last 72 hours No results found for this basename: NA, K, CL, CO, GLUCOSE, BUN, CREATININE, CALCIUM,  in the last 72 hours CBG (last 3)   Recent Labs  12/12/13 0035 12/12/13 0419 12/12/13 0704  GLUCAP 129* 131* 95    Wt Readings from Last 3 Encounters:  12/09/13 57.425 kg (126 lb 9.6 oz)  12/03/13 54.4 kg (119 lb 14.9 oz)  12/03/13 54.4 kg (119 lb 14.9 oz)    Physical Exam:  Constitutional: No distress.  Frail appearing, alert  HENT:  Head: Normocephalic and atraumatic.  Right Ear: External ear normal.  Left Ear: External ear normal.  Eyes: Conjunctivae and EOM are normal. Pupils are equal, round, and reactive to light.  Neck: No JVD present. No tracheal deviation present. No thyromegaly present.  Cardiovascular: Normal rate and regular rhythm. Exam reveals friction rub. Exam reveals no gallop.  No murmur heard.  Respiratory: No respiratory distress. She has no wheezes. She has no rales. She exhibits no tenderness.  GI: She exhibits no distension. There is no tenderness. There is no rebound.  Lymphadenopathy:  She has no cervical adenopathy.  Neurological:  Attention waxes and wanes. Still confused.  .  Language of confusion. Typically keeps eyes closed unless cued. moves all 4 limbs. Senses pain. Pretty sure she has a left HH. ?diplopia  Skin: Skin is warm. Op site intact with some fluid still under skin---no real change Psychiatric:   confused   Assessment/Plan: 1. 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 Physiatrist is providing close team supervision and 24 hour management of active medical problems listed below. Physiatrist and rehab team continue to assess barriers to discharge/monitor patient progress toward functional and medical goals.  Therapy intensity reduced to allow for better tolerance   FIM: FIM - Bathing Bathing Steps Patient Completed: Chest;Right Arm;Left Arm;Abdomen;Front perineal area;Right upper leg;Left upper leg;Right lower leg (including foot) Bathing: 4: Min-Patient completes 8-9 71f 10 parts or 75+ percent  FIM - Upper Body Dressing/Undressing Upper body dressing/undressing steps patient completed: Put head through opening of pull over shirt/dress Upper body dressing/undressing: 2: Max-Patient completed 25-49% of tasks FIM - Lower Body Dressing/Undressing Lower body dressing/undressing steps patient completed: Don/Doff left sock;Don/Doff right sock Lower body dressing/undressing: 1: Total-Patient completed less than 25% of tasks  FIM - Toileting Toileting: 1: Total-Patient completed zero steps, helper did all 3  FIM - Radio producer Devices: Bedside commode Toilet Transfers: 2-To toilet/BSC: Max A (lift and lower assist);2-From toilet/BSC: Max A (lift and lower assist)  FIM - Engineer, site Assistive Devices: Arm rests;HOB elevated;Bed rails Bed/Chair Transfer: 3: Supine > Sit: Mod A (lifting assist/Pt. 50-74%/lift 2 legs;3: Sit > Supine: Mod A (lifting assist/Pt. 50-74%/lift 2 legs);2: Bed > Chair or W/C: Max A (lift and lower assist);2: Chair or W/C > Bed: Max A (lift and lower assist)  FIM - Locomotion: Wheelchair Locomotion:  Wheelchair: 3: Travels 150 ft or more: maneuvers on rugs and over door sills with moderate assistance  (Pt: 50 - 74%) FIM - Locomotion:  Ambulation Locomotion: Ambulation Assistive Devices: Other (comment) ([name] arm over therapist's shoulder) Ambulation/Gait Assistance: 3: Mod assist;2: Max assist Locomotion: Ambulation: 2: Travels 50 - 149 ft with maximal assistance (Pt: 25 - 49%) (second person to hold pt's R hand for attention not assistance, husband following w/ w/c)  Comprehension Comprehension Mode: Auditory Comprehension: 1-Understands basic less than 25% of the time/requires cueing 75% of the time  Expression Expression Mode: Verbal Expression: 1-Expresses basis less than 25% of the time/requires cueing greater than 75% of the time.  Social Interaction Social Interaction: 2-Interacts appropriately 25 - 49% of time - Needs frequent redirection.  Problem Solving Problem Solving: 1-Solves basic less than 25% of the time - needs direction nearly all the time or does not effectively solve problems and may need a restraint for safety  Memory Memory: 1-Recognizes or recalls less than 25% of the time/requires cueing greater than 75% of the time   Medical Problem List and Plan:  Right temporal occipital metastatic tumor (SCLC)--  -XRT ongoing--?duration of therapy--7/14 treatments today -steroid taper--decadron  1. DVT Prophylaxis/Anticoagulation: Pharmaceutical: Lovenox  2. Pain Management: tylenol, hydrocodone  3. Mood: Resumed Lexapro. Team to provide emotional support to family and patient as appropriate 4. Neuropsych: This patient is not capable of making decisions on her own behalf.   -added ritalin for attention/focus--?benefit 5. FEN/malnutrition---D3 Nectars--- f/u swallow study with SLP  -eating 100% at present  -continue to push fluids  6. Sz prophylaxis---keppra  LOS (Days) 6 A FACE TO FACE EVALUATION WAS PERFORMED  Meredith Staggers 12/12/2013 7:47 AM

## 2013-12-12 NOTE — Procedures (Signed)
Objective Swallowing Evaluation: Modified Barium Swallowing Study  Patient Details  Name: Katie [name] MRN: [id number] Date of Birth: 1951/04/03  Today's Date: 12/12/2013 Time: 0900-0930 Time Calculation (min): 30 min  Past Medical History:  Past Medical History  Diagnosis Date  . Anxiety     takes lexapro   Past Surgical History:  Past Surgical History  Procedure Laterality Date  . Appendectomy    . Colon surgery      polyps  removed  . Craniotomy Right 11/12/2013    Procedure: RIGHT PARIETAL CRANIOTOMY;  Surgeon: Kristeen Miss, MD;  Location: [facility];  Service: Neurosurgery;  Laterality: Right;  . 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  PMH insignificant.  Pt also has LLL mass c/w tumor.  MBS on 4/7 with recommendations for Dys. 3 textures with nectar thick liquids. Silent aspiration of thin liquids noted.  SLP follow-up to determine tolerance of PO diet. Pt also administered cognitive-linguistic evaluation and was found to have significant deficits. Pt transferred to CIR 4/17 and has been participating in dysphagia treatment with increased sensation with trials of thin liquids. Repeat MBS today to assess for possible upgrade.      Recommendation/Prognosis  Clinical Impression:   Dysphagia Diagnosis: Mild pharyngeal phase dysphagia Clinical impression: Patient exhibits a mild-moderate sensory based pharyngeal dysphagia which is impacted by here severe cognitive impairments. Pt demonstrates a delayed swallow initiation with all consistencies resulting in silent penetration and sensed aspiration of thin liquids via straw.  Cued cough was unsuccessful in  expelling aspirates.  Straw was used to help facilitate positioning for chin tuck, however, due to pt's severe cognitive impairments; patient was unable to consistently follow directions. Pt also had one episode of silent penetration with nectar thick liquids due to a large sip. Suspect this has been happening since initiation of diet on 4/7 due to impulsivity and pt has been tolerating without any adverse effects at this time. Recommend pt continue current diet of Dys. 3 textures with nectar-thick liquids with full supervision for strict adherence to aspiration precautions. Also recommend to initiate the water protocol. [name] husband present throughout study and educated on current swallowing function and recommendations. He verbalized understanding of all information.    Swallow Evaluation Recommendations:  Diet Recommendations: Dysphagia 3 (Mechanical Soft);Nectar-thick liquid Liquid Administration via: Cup;No straw Medication Administration: Crushed with puree Supervision: Full supervision/cueing for compensatory strategies Compensations: Slow rate;Small sips/bites Postural Changes and/or Swallow Maneuvers: Seated upright 90 degrees Oral Care Recommendations: Oral care BID Other Recommendations: Prohibited food (jello, ice cream, thin soups);Order thickener from pharmacy;Remove water pitcher Follow up Recommendations: 24 hour supervision/assistance;Home health SLP    Prognosis:  Prognosis for Safe Diet Advancement: Good Barriers to Reach Goals: Cognitive deficits   Individuals Consulted: Consulted and Agree with Results and Recommendations: Patient;Family member/caregiver;RN Family Member Consulted: husband       SLP Assessment/Plan  Plan:  Speech Therapy Frequency: min 5x/week   Short Term Goals: Week 1: SLP Short Term Goal 1 (Week 1): Patient will consume PO with Mod level verbal cues for pacing and portion control. SLP Short Term Goal 2 (Week 1): Patient will sustain attention to  task for 1  minute with Mod verbal cues for redirection. SLP Short Term Goal 3 (Week 1): Patient will utilize external aids for orientation with Max assist.  SLP Short Term Goal 4 (Week 1): Patient will label 2 deficits with Max clinician cues.  SLP Short Term Goal 5 (Week 1): Patient will solve basic problems with Mod assist. SLP Short Term Goal 6 (Week 1): Patient maintain topic of conversation for 2 turns with Max clinician cues.    General: Date of Onset: 11/11/13 Type of Study: Modified Barium Swallowing Study Reason for Referral: Objectively evaluate swallowing function Previous Swallow Assessment: MBS on 4/7 and recommended Dys. 3 textures with nectar-thick liquids due to silent aspiration of thin liquids.  Diet Prior to this Study: Dysphagia 3 (soft);Nectar-thick liquids Temperature Spikes Noted: No Respiratory Status: Room air Length of Intubations (days): 4 days Date extubated: 11/15/13 Behavior/Cognition: Alert;Decreased sustained attention;Impulsive;Cooperative;Pleasant mood;Requires cueing Oral Cavity - Dentition: Adequate natural dentition Oral Motor / Sensory Function: Within functional limits Self-Feeding Abilities: Able to feed self;Needs assist;Needs set up Patient Positioning: Upright in chair Baseline Vocal Quality: Clear Volitional Swallow: Able to elicit Anatomy: Within functional limits Pharyngeal Secretions: Not observed secondary MBS   Reason for Referral:   Objectively evaluate swallowing function    Oral Phase: Oral Preparation/Oral Phase Oral Phase: WFL   Pharyngeal Phase:  Pharyngeal Phase Pharyngeal Phase: Impaired Pharyngeal - Nectar Pharyngeal - Nectar Cup: Delayed swallow initiation;Penetration/Aspiration during swallow Penetration/Aspiration details (nectar cup): Material enters airway, remains ABOVE vocal cords then ejected out Pharyngeal - Thin Pharyngeal - Thin Cup: Delayed swallow initiation;Penetration/Aspiration during  swallow Penetration/Aspiration details (thin cup): Material enters airway, remains ABOVE vocal cords then ejected out Pharyngeal - Thin Straw: Delayed swallow initiation;Penetration/Aspiration during swallow Penetration/Aspiration details (thin straw): Material enters airway, passes BELOW cords and not ejected out despite cough attempt by patient Pharyngeal - Solids Pharyngeal - Puree: Delayed swallow initiation Pharyngeal - Mechanical Soft: Delayed swallow initiation Pharyngeal - Pill: Not tested Pharyngeal Phase - Comment Pharyngeal Comment: Due to pt's cognitive deficits, pt's performance on MBS was inconsistent. Pt also unable to utilize compensatory strateiges to minimize aspiration risk    Cervical Esophageal Phase  Cervical Esophageal Phase Cervical Esophageal Phase: [facility]   GN          Buzzy Han 12/12/2013, 10:27 AM

## 2013-12-12 NOTE — Progress Notes (Signed)
Social Work  Social Work Assessment and Plan  Patient Details  Name: Katie [name] MRN: [id number] Date of Birth: 26-Oct-1950  Today's Date: 12/09/2013  Problem List:  Patient Active Problem List   Diagnosis Date Noted  . Brain tumor 12/06/2013  . 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 Malnutrition of moderate degree 11/19/2013  . Small cell lung cancer 11/18/2013  . HTN (hypertension) 11/18/2013  . Steroid-induced hyperglycemia 11/18/2013  . Fall with head trauma 11/18/2013  . Underweight 11/18/2013  . Protein-calorie malnutrition, severe 11/18/2013  . 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 brain metastases 11/15/2013  . Acute respiratory failure 11/12/2013  . Altered mental status 11/12/2013  . Syncope secondary to brain metastasis 11/11/2013   Past Medical History:  Past Medical History  Diagnosis Date  . Anxiety     takes lexapro   Past Surgical History:  Past Surgical History  Procedure Laterality Date  . Appendectomy    . Colon surgery      polyps  removed  . Craniotomy Right 11/12/2013    Procedure: RIGHT PARIETAL CRANIOTOMY;  Surgeon: Kristeen Miss, MD;  Location: [facility];  Service: Neurosurgery;  Laterality: Right;  . 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  She does not have any smokeless tobacco history on file. Her alcohol and drug histories are not on file.  Family / Support Systems Marital Status: Married How Long?: 5yrs Patient Roles: Spouse Spouse/Significant Other: husband, Katie [name] @ [phone] or [phone] Children: none - pt has no children;  husband with one son from previous marriage Other Supports: sister, Katie [name] @ [phone] or [phone] Anticipated Caregiver:  Husband Ability/Limitations of Caregiver: Husband is retired and can Veterinary surgeon Availability: 24/7 Family Dynamics: very supportive and involved family - no concerns about the support following d/c  Social History Preferred language: English Religion: Unknown Cultural Background: NA Education: HS Read: Yes Write: Yes Employment Status: Retired Date Retired/Disabled/Unemployed: "years ago" per husband to provide care for her mother Legal Hisotry/Current Legal Issues: none Guardian/Conservator: none - per MD, pt not capable of making decisions on her own behalf - defer to spouse   Abuse/Neglect Physical Abuse: Denies Verbal Abuse: Denies Sexual Abuse: Denies Exploitation of patient/patient's resources: Denies Self-Neglect: Denies  Emotional Status [name] affect, behavior adn adjustment status: Pt with significant language of confusion and cannot complete assessment herself.  family providing information.  Pt does not appear to be in any emotional distress, however, will monitor. Recent Psychosocial Issues: None Pyschiatric History: husband notes pt was placed on meds for her anxiety by her primary MD - no formal treatment  Substance Abuse History: none  Patient / Family Perceptions, Expectations & Goals Pt/Family understanding of illness & functional limitations: Cannot assess pt's understanding due to communication deficits.  Husband with very good understanding of medical issues, prognosis, treatment plans and current funcitonal limitations/ need for CIR Premorbid pt/family roles/activities: Pt and husband both independent and active until a couple of months PTA when pt began having confusion and mobility issues Anticipated changes in roles/activities/participation: Husband to continue in caregiver role Pt/family expectations/goals: "I just hope her confusion gets better.  I hope she'll get stronger, too."  per husband  [employer]  Community Agencies: None Premorbid  Home Care/DME Agencies: None Transportation available at discharge: yes Resource referrals recommended: Neuropsychology;Support group (specify);Advocacy groups  Discharge Planning Living Arrangements: Spouse/significant other Support Systems: Spouse/significant other;Children;Other relatives Type of Residence: Private residence Insurance Resources: Multimedia programmer (specify) Nurse, mental health) Financial Screen Referred: No Living Expenses: Higher education careers adviser Management: Spouse Does the patient have any problems obtaining your medications?: No Home Management: pt and husband Patient/Family Preliminary Plans: pt to d/c home with husband to provide 24/7 assist Social Work Anticipated Follow Up Needs: HH/OP;Support Group Expected length of stay: 3-4 weeks  Clinical Impression Very unfortunate woman here with new diagnosis of metastatic CA to brain. With significant language of confusion.  Excellent family support and husband able to provide 24/7 care.  Husband with good understanding of medical picture and treatment plans moving forward.  Will follow for support and d/c planning needs.  Lennart Pall 12/09/2013, 10:55 AM

## 2013-12-12 NOTE — Care Management Note (Signed)
Inpatient [facility] Individual Statement of Services  Patient Name:  Katie [name]  Date:  12/12/2013  Welcome to the [facility].  Our goal is to provide you with an individualized program based on your diagnosis and situation, designed to meet your specific needs.  With this comprehensive rehabilitation program, you will be expected to participate in at least 3 hours of rehabilitation therapies Monday-Friday, with modified therapy programming on the weekends.  Your rehabilitation program will include the following services:  Physical Therapy (PT), Occupational Therapy (OT), Speech Therapy (ST), 24 hour per day rehabilitation nursing, Therapeutic Recreaction (TR), Neuropsychology, Case Management (Social Worker), Rehabilitation Medicine, Nutrition Services and Pharmacy Services  Weekly team conferences will be held on Tuesdays to discuss your progress.  Your Social Worker will talk with you frequently to get your input and to update you on team discussions.  Team conferences with you and your family in attendance may also be held.  Expected length of stay: 3-4 weeks  Overall anticipated outcome: minimal assistance  Depending on your progress and recovery, your program may change. Your Social Worker will coordinate services and will keep you informed of any changes. Your Social Worker's name and contact numbers are listed  below.  The following services may also be recommended but are not provided by the [facility] will be made to provide these services after discharge if needed.  Arrangements include referral to agencies that provide these services.  Your insurance has been verified to be:  [place] Your primary doctor is:  Dr. Raechel Ache  Pertinent information will be shared with your doctor and your insurance company.  Social  Worker:  Lennart Pall, [place] or ([phone]   Information discussed with and copy given to patient by: Lennart Pall, 12/12/2013, 10:43 AM

## 2013-12-13 ENCOUNTER — Inpatient Hospital Stay (HOSPITAL_COMMUNITY): Payer: BC Managed Care – PPO

## 2013-12-13 ENCOUNTER — Inpatient Hospital Stay (HOSPITAL_COMMUNITY): Payer: BC Managed Care – PPO | Admitting: Speech Pathology

## 2013-12-13 ENCOUNTER — Ambulatory Visit
Admit: 2013-12-13 | Discharge: 2013-12-13 | Disposition: A | Discharge status: Home / Self Care | Payer: BC Managed Care – PPO | Attending: Radiation Oncology | Admitting: Radiation Oncology

## 2013-12-13 ENCOUNTER — Encounter (HOSPITAL_COMMUNITY): Payer: BC Managed Care – PPO

## 2013-12-13 DIAGNOSIS — C7931 Secondary malignant neoplasm of brain: Secondary | ICD-10-CM

## 2013-12-13 DIAGNOSIS — I10 Essential (primary) hypertension: Secondary | ICD-10-CM

## 2013-12-13 DIAGNOSIS — R4182 Altered mental status, unspecified: Secondary | ICD-10-CM

## 2013-12-13 DIAGNOSIS — C7949 Secondary malignant neoplasm of other parts of nervous system: Secondary | ICD-10-CM

## 2013-12-13 DIAGNOSIS — F341 Dysthymic disorder: Secondary | ICD-10-CM

## 2013-12-13 LAB — GLUCOSE, CAPILLARY
GLUCOSE-CAPILLARY: 112 mg/dL — AB (ref 70–99)
GLUCOSE-CAPILLARY: 128 mg/dL — AB (ref 70–99)
GLUCOSE-CAPILLARY: 148 mg/dL — AB (ref 70–99)
GLUCOSE-CAPILLARY: 95 mg/dL (ref 70–99)
Glucose-Capillary: 132 mg/dL — ABNORMAL HIGH (ref 70–99)
Glucose-Capillary: 138 mg/dL — ABNORMAL HIGH (ref 70–99)
Glucose-Capillary: 148 mg/dL — ABNORMAL HIGH (ref 70–99)

## 2013-12-13 NOTE — Progress Notes (Signed)
Physical Therapy Session Note  Patient Details  Name: Katie [name] MRN: [id number] Date of Birth: [birth year]  Today's Date: 12/13/2013 Time: 7616-0737 Time Calculation (min): 45 min  Short Term Goals: Week 1:  PT Short Term Goal 1 (Week 1): Pt will increase bed mobility to mod A.  PT Short Term Goal 2 (Week 1): Pt will increase transfer bed to chair, chair to bed to mod A.  PT Short Term Goal 3 (Week 1): Pt will increase ambulation with LRAD to mod A about 25 feet.  PT Short Term Goal 4 (Week 1): Pt will ascend/descend 2 stairs with B rails and max A.  PT Short Term Goal 5 (Week 1): Pt will propel w/c about 100 feet with B LEs and min A.   Skilled Therapeutic Interventions/Progress Updates:  1:1. Pt received sitting in w/c, ready for therapy. Focus this session on alertness, sustained attention to therapeutic tasks, functional endurance and ambulation. Pt req max multimodal cueing and constant redirection to tasks throughout session due to poor sustained attention and verbal perseveration. Pt req mod A for w/c propulsion room>day room w/ B LE following husband for motivation. In standing and sitting, pt engaged in game of horseshoes req pt to maintain EO to locate horseshoe in various quadrants as well as engage core in reaching +/- BOS. Increased ease of max A for t/f sit<>stand when pt places B UE around therapist vs. Having pt push up through arm rests as she tends to maintain grasp. Pt motivated to amb to find/call sister or find ice cream. Pt req max A x1person w/ pt's L UE over therapist's shoulder for amb 75'x2 and 100'. Trial R posterior AFO to prevent onset of genu recurvatum during stance phase with decreased hyperextension approx. 50% of time. [name] B LE giving out x2 req pt to sit on pt's knee, Ax2persons to assit pt back to standing. [name] husband following close behind w/ w/c. Pt sitting in w/c at end of session w/ all needs in reach, quick release belt in place and husband in room.     Therapy Documentation Precautions:  Precautions Precautions: Fall Precaution Comments: impulsive, decreased safety awareness Restrictions Weight Bearing Restrictions: No  See FIM for current functional status  Therapy/Group: Individual Therapy  Gilmore Laroche 12/13/2013, 11:18 AM

## 2013-12-13 NOTE — Plan of Care (Signed)
Problem: RH BLADDER ELIMINATION Goal: RH STG MANAGE BLADDER WITH ASSISTANCE STG Manage Bladder With Mod Assistance  Outcome: Not Progressing Pt having incontinent episodes of bladder

## 2013-12-13 NOTE — Plan of Care (Signed)
Problem: RH BOWEL ELIMINATION Goal: RH STG MANAGE BOWEL WITH ASSISTANCE STG Manage Bowel with Mod Assistance.  Outcome: Not Progressing Pt has incontinent episodes of bowel

## 2013-12-13 NOTE — Progress Notes (Signed)
Occupational Therapy Session Note  Patient Details  Name: Katie [name] MRN: [id number] Date of Birth: [birth year]  Today's Date: 12/13/2013  Session 1 Time: 0900-1000 Time Calculation (min): 60 min  Short Term Goals: Week 1:  OT Short Term Goal 1 (Week 1): Patient will complete wheelchair to toilet transfer with moderate assistance. OT Short Term Goal 2 (Week 1): Patient will groom at the sink with minimum assistance. OT Short Term Goal 3 (Week 1): Patient will complete upper body dressing with moderate assistance. OT Short Term Goal 4 (Week 1): Patient will complete lower body dressing with maximum assistance. OT Short Term Goal 5 (Week 1): Patient will complete toileting with maximum assistance.  Skilled Therapeutic Interventions/Progress Updates:    Pt engaged in BADL retraining including bathing at shower level and dressing with sit<>stand from w/c.  [name] husband present and provided intermittent and appropriate verbal cues for patient to initiate tasks and for redirection.  Pt required continues to require max verbal cues for task initiation, sequencing, and redirection to task.  Pt requires continued encouragement to perform tasks preferring to get assistance instead.  Pt required max A for sit<>stand in shower and mod A for sit<>stand from w/c at sink. Focus on activity tolerance, safety awareness, bed mobility, sit<>stand, standing balance, task initiation, attention to task, and family education.  Therapy Documentation Precautions:  Precautions Precautions: Fall Precaution Comments: impulsive, decreased safety awareness Restrictions Weight Bearing Restrictions: No   Pain: Pain Assessment Pain Assessment: No/denies pain  See FIM for current functional status  Therapy/Group: Individual Therapy  Session 2  Pt missed 30 mins skilled OT services.  Pt out of hospital for radiation treatment.  Anselmo Rod Uel Davidow 12/13/2013, 10:14 AM

## 2013-12-13 NOTE — Progress Notes (Signed)
[facility]IC[facility] & REHABILITATION     PROGRESS NOTE    Subjective/Complaints: Slept well again. No new issues. Ros limited due to cognition/language.  Objective: Vital Signs: Blood pressure 107/68, pulse 77, temperature 98.1 F (36.7 C), temperature source Oral, resp. rate 17, height 5\' 7"  (1.702 m), weight 57.425 kg (126 lb 9.6 oz), SpO2 98.00%. Dg Swallowing Func-speech Pathology  12/12/2013   Buzzy Han, CCC-SLP     12/12/2013 11:35 AM   Objective Swallowing Evaluation: Modified Barium Swallowing Study   Patient Details  Name: Katie [name] MRN: [id number] Date of Birth: Jun 02, 1951  Today's Date: 12/12/2013 Time: 0900-0930 Time Calculation (min): 30 min  Past Medical History:  Past Medical History  Diagnosis Date  . Anxiety     takes lexapro   Past Surgical History:  Past Surgical History  Procedure Laterality Date  . Appendectomy    . Colon surgery      polyps  removed  . Craniotomy Right 11/12/2013    Procedure: RIGHT PARIETAL CRANIOTOMY;  Surgeon: Kristeen Miss,  MD;  Location: [facility];  Service: Neurosurgery;  Laterality:  Right;  . 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  PMH insignificant.  Pt also has  LLL mass c/w tumor.  MBS on 4/7 with recommendations for Dys. 3  textures with nectar thick liquids. Silent aspiration of thin  liquids noted.  SLP follow-up to determine tolerance of PO diet.  Pt also administered cognitive-linguistic evaluation and was  found to have significant deficits. Pt transferred to CIR 4/17  and has been participating in dysphagia treatment with increased  sensation with trials of thin liquids. Repeat MBS today to assess  for  possible upgrade.      Recommendation/Prognosis  Clinical Impression:   Dysphagia Diagnosis: Mild pharyngeal phase dysphagia Clinical impression: Patient exhibits a mild-moderate sensory  based pharyngeal dysphagia which is impacted by here severe  cognitive impairments. Pt demonstrates a delayed swallow  initiation with all consistencies resulting in silent penetration  and sensed aspiration of thin liquids via straw.  Cued cough was  unsuccessful in expelling aspirates.  Straw was used to help  facilitate positioning for chin tuck, however, due to pt's severe  cognitive impairments; patient was unable to consistently follow  directions. Pt also had one episode of silent penetration with  nectar thick liquids due to a large sip. Suspect this has been  happening since initiation of diet on 4/7 due to impulsivity and  pt has been tolerating without any adverse effects at this time.  Recommend pt continue current diet of Dys. 3 textures with  nectar-thick liquids with full supervision for strict adherence  to aspiration precautions. Also recommend to initiate the water  protocol. [name] husband present throughout study and educated on  current swallowing function and recommendations. He verbalized  understanding of all information.    Swallow Evaluation Recommendations:  Diet Recommendations: Dysphagia 3 (Mechanical Soft);Nectar-thick  liquid Liquid Administration via: Cup;No straw Medication Administration: Crushed with puree Supervision: Full supervision/cueing for compensatory strategies Compensations: Slow rate;Small sips/bites Postural Changes and/or Swallow Maneuvers: Seated upright 90  degrees Oral Care Recommendations: Oral care BID Other  Recommendations: Prohibited food (jello, ice cream, thin  soups);Order thickener from pharmacy;Remove water pitcher Follow up Recommendations: 24 hour supervision/assistance;Home  health SLP    Prognosis:  Prognosis for Safe Diet Advancement: Good Barriers to Reach Goals:  Cognitive deficits   Individuals Consulted: Consulted and Agree with Results and  Recommendations: Patient;Family member/caregiver;RN Family Member Consulted: husband       SLP Assessment/Plan  Plan:  Speech Therapy Frequency: min 5x/week   Short Term Goals: Week 1: SLP Short Term Goal 1 (Week 1): Patient  will consume PO with Mod level verbal cues for pacing and portion  control. SLP Short Term Goal 2 (Week 1): Patient will sustain attention to  task for 1 minute with Mod verbal cues for redirection. SLP Short Term Goal 3 (Week 1): Patient will utilize external  aids for orientation with Max assist.  SLP Short Term Goal 4 (Week 1): Patient will label 2 deficits  with Max clinician cues.  SLP Short Term Goal 5 (Week 1): Patient will solve basic problems  with Mod assist. SLP Short Term Goal 6 (Week 1): Patient maintain topic of  conversation for 2 turns with Max clinician cues.    General: Date of Onset: 11/11/13 Type of Study: Modified Barium Swallowing Study Reason for Referral: Objectively evaluate swallowing function Previous Swallow Assessment: MBS on 4/7 and recommended Dys. 3  textures with nectar-thick liquids due to silent aspiration of  thin liquids.  Diet Prior to this Study: Dysphagia 3 (soft);Nectar-thick liquids Temperature Spikes Noted: No Respiratory Status: Room air Length of Intubations (days): 4 days Date extubated: 11/15/13 Behavior/Cognition: Alert;Decreased sustained  attention;Impulsive;Cooperative;Pleasant mood;Requires cueing Oral Cavity - Dentition: Adequate natural dentition Oral Motor / Sensory Function: Within functional limits Self-Feeding Abilities: Able to feed self;Needs assist;Needs set  up Patient Positioning: Upright in chair Baseline Vocal Quality: Clear Volitional Swallow: Able to elicit Anatomy: Within functional limits Pharyngeal Secretions: Not observed secondary MBS   Reason for Referral:   Objectively evaluate swallowing function    Oral Phase: Oral Preparation/Oral Phase  Oral Phase: WFL   Pharyngeal Phase:  Pharyngeal Phase Pharyngeal Phase: Impaired Pharyngeal - Nectar Pharyngeal - Nectar Cup: Delayed swallow  initiation;Penetration/Aspiration during swallow Penetration/Aspiration details (nectar cup): Material enters  airway, remains ABOVE vocal cords then ejected out Pharyngeal - Thin Pharyngeal - Thin Cup: Delayed swallow  initiation;Penetration/Aspiration during swallow Penetration/Aspiration details (thin cup): Material enters  airway, remains ABOVE vocal cords then ejected out Pharyngeal - Thin Straw: Delayed swallow  initiation;Penetration/Aspiration during swallow Penetration/Aspiration details (thin straw): Material enters  airway, passes BELOW cords and not ejected out despite cough  attempt by patient Pharyngeal - Solids Pharyngeal - Puree: Delayed swallow initiation Pharyngeal - Mechanical Soft: Delayed swallow initiation Pharyngeal - Pill: Not tested Pharyngeal Phase - Comment Pharyngeal Comment: Due to pt's cognitive deficits, pt's  performance on MBS was inconsistent. Pt also unable to utilize  compensatory strateiges to minimize aspiration risk    Cervical Esophageal Phase  Cervical Esophageal Phase Cervical Esophageal Phase: [facility]   GN          Buzzy Han 12/12/2013, 10:27 AM                    No results found for this basename: WBC, HGB, HCT, PLT,  in the last 72 hours No results found for this basename: NA, K, CL, CO, GLUCOSE, BUN, CREATININE, CALCIUM,  in the last 72 hours CBG (last 3)   Recent Labs  12/12/13 2043 12/13/13 0006 12/13/13  0459  GLUCAP 130* 112* 132*    Wt Readings from Last 3 Encounters:  12/09/13 57.425 kg (126 lb 9.6 oz)  12/03/13 54.4 kg (119 lb 14.9 oz)  12/03/13 54.4 kg (119 lb 14.9 oz)    Physical Exam:  Constitutional: No distress.  Frail appearing, alert  HENT:  Head: Normocephalic and atraumatic.  Right Ear: External ear normal.  Left Ear: External ear normal.  Eyes: Conjunctivae and EOM are normal. Pupils  are equal, round, and reactive to light.  Neck: No JVD present. No tracheal deviation present. No thyromegaly present.  Cardiovascular: Normal rate and regular rhythm. Exam reveals friction rub. Exam reveals no gallop.  No murmur heard.  Respiratory: No respiratory distress. She has no wheezes. She has no rales. She exhibits no tenderness.  GI: She exhibits no distension. There is no tenderness. There is no rebound.  Lymphadenopathy:  She has no cervical adenopathy.  Neurological:  Attention waxes and wanes. Still confused.    Language of confusion. Typically keeps eyes closed unless cued. moves all 4 limbs. Senses pain. Pretty sure she has a left HH. ?diplopia  Skin: Skin is warm. Op site intact with some fluid still under skin---no real change Psychiatric:   confused   Assessment/Plan: 1. 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 Physiatrist is providing close team supervision and 24 hour management of active medical problems listed below. Physiatrist and rehab team continue to assess barriers to discharge/monitor patient progress toward functional and medical goals.  Therapy intensity reduced to allow for better tolerance   FIM: FIM - Bathing Bathing Steps Patient Completed: Chest;Right Arm;Left Arm;Abdomen;Front perineal area;Right upper leg;Left upper leg;Right lower leg (including foot);Left lower leg (including foot) Bathing: 4: Min-Patient completes 8-9 65f 10 parts or 75+ percent  FIM - Upper Body Dressing/Undressing Upper body dressing/undressing steps patient completed: Put head through opening of pull over shirt/dress;Pull shirt over trunk Upper body dressing/undressing: 3: Mod-Patient completed 50-74% of tasks FIM - Lower Body Dressing/Undressing Lower body dressing/undressing steps patient completed: Don/Doff left sock;Don/Doff right sock Lower  body dressing/undressing: 1: Total-Patient completed less than 25% of tasks  FIM - Toileting Toileting: 1: Total-Patient completed zero steps, helper did all 3  FIM - Radio producer Devices: Bedside commode Toilet Transfers: 2-To toilet/BSC: Max A (lift and lower assist);2-From toilet/BSC: Max A (lift and lower assist)  FIM - Control and instrumentation engineer Devices: Arm rests Bed/Chair Transfer: 2: Bed > Chair or W/C: Max A (lift and lower assist);2: Chair or W/C > Bed: Max A (lift and lower assist)  FIM - Locomotion: Wheelchair Locomotion: Wheelchair: 2: Travels 50 - 149 ft with minimal assistance (Pt.>75%) FIM - Locomotion: Ambulation Locomotion: Ambulation Assistive Devices: Other (comment) ([name] arm over therapist's shoulder) Ambulation/Gait Assistance: 1: +2 Total assist;2: Max assist Locomotion: Ambulation: 1: Two helpers  Comprehension Comprehension Mode: Auditory Comprehension: 2-Understands basic 25 - 49% of the time/requires cueing 51 - 75% of the time  Expression Expression Mode: Verbal Expression: 2-Expresses basic 25 - 49% of the time/requires cueing 50 - 75% of the time. Uses single words/gestures.  Social Interaction Social Interaction: 2-Interacts appropriately 25 - 49% of time - Needs frequent redirection.  Problem Solving Problem Solving: 1-Solves basic less than 25% of the time - needs direction nearly all the time or does not effectively solve problems and may need a restraint for safety  Memory  Memory: 1-Recognizes or recalls less than 25% of the time/requires cueing greater than 75% of the time   Medical Problem List and Plan:  Right temporal occipital metastatic tumor (SCLC)--  -XRT ongoing--14 total treatments planned -steroid taper--decadron  1. DVT Prophylaxis/Anticoagulation: Pharmaceutical: Lovenox  2. Pain Management: tylenol, hydrocodone  3. Mood: Resumed Lexapro.   4. Neuropsych: This patient is  not capable of making decisions on her own behalf.   -added ritalin for attention/focus--?benefit 5. FEN/malnutrition---D3 Nectars--- f/u swallow study recommended that she continue on this  -eating 100% at present  -continue to push fluids  6. Sz prophylaxis---keppra  LOS (Days) 7 A FACE TO FACE EVALUATION WAS PERFORMED  Meredith Staggers 12/13/2013 7:21 AM

## 2013-12-13 NOTE — Progress Notes (Signed)
Katie [name] is making some progress. She seemed a little more alert. She seems to be well. She is doing her physical therapy. She's having her radiation therapy and doing well with this.  She's had no obvious pain issues. She's had no bleeding. There is some bladder incontinence. She's had no weakness. There's been no rashes.  On physical exam, vital signs are stable. Blood pressure 117/71.  Lungs are clear. Cardiac exam regular in rhythm. Abdomen soft. Has good bowel sounds. There is a palpable liver or spleen tip. Extremities shows good movement of her extremities. She has no edema in her legs. Skin exam no rashes. Neurological exam shows some slight confusion but in this is relatively mild.  We will continue to follow along. I will plan for systemic chemotherapy and with her once she is an outpatient.  I very much appreciate irregular that she is getting in the rehabilitation unit.

## 2013-12-13 NOTE — Progress Notes (Signed)
Speech Language Pathology Daily Session Note  Patient Details  Name: Katie [name] MRN: [id number] Date of Birth: 11-15-1950  Today's Date: 12/13/2013 Time: 1655-3748 Time Calculation (min): 42 min  Short Term Goals: Week 1: SLP Short Term Goal 1 (Week 1): Patient will consume PO with Mod level verbal cues for pacing and portion control. SLP Short Term Goal 2 (Week 1): Patient will sustain attention to task for 1 minute with Mod verbal cues for redirection. SLP Short Term Goal 3 (Week 1): Patient will utilize external aids for orientation with Max assist.  SLP Short Term Goal 4 (Week 1): Patient will label 2 deficits with Max clinician cues.  SLP Short Term Goal 5 (Week 1): Patient will solve basic problems with Mod assist. SLP Short Term Goal 6 (Week 1): Patient maintain topic of conversation for 2 turns with Max clinician cues.  Skilled Therapeutic Interventions: Skilled treatment session focused on addressing dysphagia and cognitive-linguistic goals. SLP facilitated session by reducing environmental distractions, set-up assist with lunch tray and Max faded to Mod assist verbal and tactile cues for utilization of safe swallow strategies.  Patient consumed Dys.3 textures and nectar-thick liquids with intermittent throat clears; SLP suspects due to bolus size as throat clears resolved with smaller portions.  Continue with current plan of care.   FIM:  Comprehension Comprehension Mode: Auditory Comprehension: 2-Understands basic 25 - 49% of the time/requires cueing 51 - 75% of the time Expression Expression Mode: Verbal Expression: 2-Expresses basic 25 - 49% of the time/requires cueing 50 - 75% of the time. Uses single words/gestures. Social Interaction Social Interaction: 2-Interacts appropriately 25 - 49% of time - Needs frequent redirection. Problem Solving Problem Solving: 1-Solves basic less than 25% of the time - needs direction nearly all the time or does not effectively solve  problems and may need a restraint for safety Memory Memory: 1-Recognizes or recalls less than 25% of the time/requires cueing greater than 75% of the time FIM - Eating Eating Activity: 5: Set-up assist for open containers;5: Set-up assist for cut food;5: Needs verbal cues/supervision;4: Helper checks for pocketed food  Pain Pain Assessment Pain Assessment: No/denies pain  Therapy/Group: Individual Therapy  Carmelia Roller., [place]  Mora Appl 12/13/2013, 1:14 PM

## 2013-12-14 ENCOUNTER — Inpatient Hospital Stay (HOSPITAL_COMMUNITY): Payer: BC Managed Care – PPO | Admitting: Physical Therapy

## 2013-12-14 ENCOUNTER — Encounter (HOSPITAL_COMMUNITY): Payer: BC Managed Care – PPO | Admitting: Occupational Therapy

## 2013-12-14 DIAGNOSIS — W19XXXA Unspecified fall, initial encounter: Secondary | ICD-10-CM

## 2013-12-14 DIAGNOSIS — I10 Essential (primary) hypertension: Secondary | ICD-10-CM

## 2013-12-14 DIAGNOSIS — E440 Moderate protein-calorie malnutrition: Secondary | ICD-10-CM

## 2013-12-14 DIAGNOSIS — J9600 Acute respiratory failure, unspecified whether with hypoxia or hypercapnia: Secondary | ICD-10-CM

## 2013-12-14 LAB — GLUCOSE, CAPILLARY
GLUCOSE-CAPILLARY: 133 mg/dL — AB (ref 70–99)
GLUCOSE-CAPILLARY: 156 mg/dL — AB (ref 70–99)
Glucose-Capillary: 148 mg/dL — ABNORMAL HIGH (ref 70–99)
Glucose-Capillary: 137 mg/dL — ABNORMAL HIGH (ref 70–99)
Glucose-Capillary: 105 mg/dL — ABNORMAL HIGH (ref 70–99)

## 2013-12-14 NOTE — Progress Notes (Signed)
Occupational Therapy Session Note  Patient Details  Name: Katie [name] MRN: [id number] Date of Birth: 1951-07-04  Today's Date: 12/14/2013 Time: 1030-1120 Time Calculation (min): 50 min  Short Term Goals: Week 1:  OT Short Term Goal 1 (Week 1): Patient will complete wheelchair to toilet transfer with moderate assistance. OT Short Term Goal 2 (Week 1): Patient will groom at the sink with minimum assistance. OT Short Term Goal 3 (Week 1): Patient will complete upper body dressing with moderate assistance. OT Short Term Goal 4 (Week 1): Patient will complete lower body dressing with maximum assistance. OT Short Term Goal 5 (Week 1): Patient will complete toileting with maximum assistance.  Skilled Therapeutic Interventions/Progress Updates:    Pt performed toileting, showering, and dressing during session.  She needed max assist with max demonstrational cueing to transfer from supine to EOB as well as max assist for stand pivot to the wheelchair.  Increased posterior lean in stand with decreased efficiency with advancing LEs.  Therapist asked pt if she needed to use the bathroom so transferred stand pivot from wheelchair again with max assist to the elevated toilet.  Pt needed re-direction to stay standing so her brief could be removed.  Once on the toilet she was able to void.  Transferred from toilet to shower seat with max assist.  Pt needing max demonstrational cueing to step closer to the shower seat before sitting down.  Max instructional cueing for all bathing as pt could not sequence and perseverated on washing her face.  Decreased ability also to see items in front of her such as the hand held shower.  Language of confusion and pt perseverating on "have you seen [employer]?" throughout session.  Performed dressing sit to stand at the sink.  [name] husband Jenny Reichmann also present and provided cueing for sequencing dressing as well.  Pt needed re-direction for donning undershirt as she did not orient it  correctly and place her arms in the correct spots.  She was able to donn her shirt correctly with assistance only needed for pulling it down in the front and back.  Max assist for donning pants and brief.  Pt was able to perform sit to stand with dressing tasks with max assist and maintained standing with min to mod assist while she attempted to pull up her brief and pants.  Secondary to decreased time therapist assisted with TEDS and shoes.  [name] husband planned to assist with grooming activities from wheelchair level as well.   Therapy Documentation Precautions:  Precautions Precautions: Fall Precaution Comments: impulsive, decreased safety awareness Restrictions Weight Bearing Restrictions: No  Pain: Pain Assessment Pain Assessment: No/denies pain ADL: See FIM for current functional status  Therapy/Group: Individual Therapy  Cindra Presume OTR/L 12/14/2013, 12:25 PM

## 2013-12-14 NOTE — Progress Notes (Signed)
Patient ID: Katie [name], female   DOB: Jan 15, 1951, 63 y.o.   MRN: [id number]   [place] PHYSICAL MEDICINE & REHABILITATION     PROGRESS NOTE    Subjective/Complaints:  69/61.  63 year old patient who has a history of small cell lung cancer, who was admitted to the hospital with syncope, secondary to brain metastases.  Hospital course, complicated by acute respiratory failure.  Medical problems include hypertension and steroid-induced hyperglycemia.  The follicles associated with some head trauma.  She has malnutrition, secondary to her malignancy.  Slept well again. No new issues. Ros limited due to cognition/language.  Past Medical History  Diagnosis Date  . Anxiety     takes lexapro    History   Social History  . Marital Status: Married    Spouse Name: N/A    Number of Children: N/A  . Years of Education: N/A   Occupational History  . Not on file.   Social History Main Topics  . Smoking status: Current Every Day Smoker -- 1.50 packs/day for 45 years  . Smokeless tobacco: Not on file  . Alcohol Use: Not on file  . Drug Use: Not on file  . Sexual Activity: Not on file   Other Topics Concern  . Not on file   Social History Narrative  . No narrative on file    Past Surgical History  Procedure Laterality Date  . Appendectomy    . Colon surgery      polyps  removed  . Craniotomy Right 11/12/2013    Procedure: RIGHT PARIETAL CRANIOTOMY;  Surgeon: Kristeen Miss, MD;  Location: [facility];  Service: Neurosurgery;  Laterality: Right;  . 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  No Known Allergies  No current facility-administered medications on file prior to encounter.   Current Outpatient Prescriptions on File Prior to  Encounter  Medication Sig Dispense Refill  . aspirin EC 81 MG tablet Take 81 mg by mouth daily.      Marland Kitchen atorvastatin (LIPITOR) 10 MG tablet Take 10 mg by mouth every evening.      . cyanocobalamin 500 MCG tablet Take 500 mcg by mouth daily.      Marland Kitchen escitalopram (LEXAPRO) 10 MG tablet Take 10 mg by mouth every morning.        BP 107/61  Pulse 66  Temp(Src) 97.5 F (36.4 C) (Axillary)  Resp 17  Ht 5\' 7"  (1.702 m)  Wt 57.425 kg (126 lb 9.6 oz)  BMI 19.82 kg/m2  SpO2 99%    Intake/Output Summary (Last 24 hours) at 12/14/13 0824 Last data filed at 12/13/13 2230  Gross per 24 hour  Intake   1020 ml  Output      0 ml  Net   1020 ml    Patient Vitals for the past 24 hrs:  BP Temp Temp src Pulse Resp SpO2  12/14/13 0522 107/61 mmHg 97.5 F (36.4 C) Axillary 66 17 99 %  12/13/13 2203 92/46 mmHg 97.3 F (36.3 C) Oral 70 18 99 %  12/13/13 1510 117/71 mmHg 98 F (36.7 C) Oral 74 17 98 %    Objective: Vital Signs: Blood pressure 107/61, pulse 66, temperature 97.5 F (36.4 C), temperature source Axillary, resp.  rate 17, height 5\' 7"  (1.702 m), weight 57.425 kg (126 lb 9.6 oz), SpO2 99.00%.  No results found for this basename: WBC, HGB, HCT, PLT,  in the last 72 hours No results found for this basename: NA, K, CL, CO, GLUCOSE, BUN, CREATININE, CALCIUM,  in the last 72 hours CBG (last 3)   Recent Labs  12/13/13 1953 12/13/13 2350 12/14/13 0357  GLUCAP 148* 148* 148*    Wt Readings from Last 3 Encounters:  12/09/13 57.425 kg (126 lb 9.6 oz)  12/03/13 54.4 kg (119 lb 14.9 oz)  12/03/13 54.4 kg (119 lb 14.9 oz)    Physical Exam:  Constitutional: No distress.  Frail appearing, alert  HENT:  Head: Normocephalic and atraumatic.  Right Ear: External ear normal.  Left Ear: External ear normal.  Eyes: Conjunctivae and EOM are normal. Pupils are equal, round, and reactive to light.  Neck: No JVD present. No tracheal deviation present. No thyromegaly present.  Cardiovascular:  Normal rate and regular rhythm. Exam reveals friction rub. Exam reveals no gallop.  No murmur heard.  Respiratory: No respiratory distress. She has no wheezes. She has no rales. She exhibits no tenderness.  Port a cath R chest wall GI: She exhibits no distension. There is no tenderness. There is no rebound.  Lymphadenopathy:  She has no cervical adenopathy.  Neurological:  Attention waxes and wanes. Still confused.    Language of confusion. Typically keeps eyes closed unless cued. moves all 4 limbs.   Assessment/Plan: 1. 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   Medical Problem List and Plan:  Right temporal occipital metastatic tumor (SCLC)--  -XRT ongoing--14 total treatments planned -steroid taper--decadron  1. DVT Prophylaxis/Anticoagulation: Pharmaceutical: Lovenox  2. Pain Management: tylenol, hydrocodone  3. Mood: Resumed Lexapro.   4. Neuropsych: This patient is not capable of making decisions on her own behalf.   -added ritalin for attention/focus--?benefit 5. FEN/malnutrition---D3 Nectars--- f/u swallow study recommended that she continue on this  -eating 100% at present  -continue to push fluids  6. Sz prophylaxis---keppra  LOS (Days) 8 A FACE TO FACE EVALUATION WAS PERFORMED  Marletta Lor 12/14/2013 8:20 AM

## 2013-12-14 NOTE — Progress Notes (Signed)
Physical Therapy Session Note  Patient Details  Name: Katie [name] MRN: [id number] Date of Birth: [birth year]  Today's Date: 12/14/2013 Time: 5498-2641 Time Calculation (min): 50 min  Short Term Goals: Week 1:  PT Short Term Goal 1 (Week 1): Pt will increase bed mobility to mod A.  PT Short Term Goal 2 (Week 1): Pt will increase transfer bed to chair, chair to bed to mod A.  PT Short Term Goal 3 (Week 1): Pt will increase ambulation with LRAD to mod A about 25 feet.  PT Short Term Goal 4 (Week 1): Pt will ascend/descend 2 stairs with B rails and max A.  PT Short Term Goal 5 (Week 1): Pt will propel w/c about 100 feet with B LEs and min A.   Skilled Therapeutic Interventions/Progress Updates:  Patient received sitting in w/c with quick release belt on, husband present. Session focused on following simple commands and initiating and sustaining attention with functional mobility. Patient requires max multimodal cueing and constant redirection to tasks in therapy due to poor sustained attention and language of confusion. W/c propulsion x 50 ft with B UE and mod A for obstacle negotiation, max verbal cues for initiation and attention to task. Pt requires max to total A of 1-2 people for all functional transfers with max multimodal cues for sequence, motor planning, and technique. Pt pulling on armrests vs pushing up, postierior lean, extending LEs with decreased ability to utilize LEs for sit<>stand. Gait training x 100 ft and 75 ft with +2 assist with therapist and pt's husband using 3 musketeers technique; gait training 100 ft with EVA walker and max A x 1 with therapist facilitating hip extension and controlling walker in controlled environment, husband with close w/c follow. Gait training in ADL apartment for household carpeted environment using EVA walker max A x 25 ft. Pt performed stand <> sit on bed with max A x 1. In sitting, pt laid back with LEs still on floor. Mod A for supine > sit to prop up  on elbows then push up and mod multimodal cues for sequencing/initiation. Pt reporting need for bathroom and returned to room in w/c. Max A x 2 stand pivot w/c <> commode. Pt putting clean toilet paper in mouth when asked to perform pericare after toileting, requiring max multimodal cues to complete successfully. Vitals monitored throughout with 02 sats above 93% throughout session. Pt left sitting in w/c with quick release belt on and husband in room.    Therapy Documentation Precautions: Fall, impulsive, decreased safety awareness No WB restrictions Pain: Pain Assessment Pain Assessment: No/denies pain Locomotion : Ambulation Ambulation/Gait Assistance: 2: Max assist;1: +2 Total assist Wheelchair Mobility Distance: 50   See FIM for current functional status  Therapy/Group: Individual Therapy  Laretta Alstrom 12/14/2013, 4:01 PM

## 2013-12-15 ENCOUNTER — Inpatient Hospital Stay (HOSPITAL_COMMUNITY): Payer: BC Managed Care – PPO | Admitting: Physical Therapy

## 2013-12-15 DIAGNOSIS — D496 Neoplasm of unspecified behavior of brain: Secondary | ICD-10-CM

## 2013-12-15 LAB — GLUCOSE, CAPILLARY
GLUCOSE-CAPILLARY: 116 mg/dL — AB (ref 70–99)
GLUCOSE-CAPILLARY: 120 mg/dL — AB (ref 70–99)
GLUCOSE-CAPILLARY: 144 mg/dL — AB (ref 70–99)
GLUCOSE-CAPILLARY: 124 mg/dL — AB (ref 70–99)
Glucose-Capillary: 127 mg/dL — ABNORMAL HIGH (ref 70–99)
Glucose-Capillary: 141 mg/dL — ABNORMAL HIGH (ref 70–99)

## 2013-12-15 NOTE — Progress Notes (Signed)
Patient ID: Katie [name], female   DOB: 04-Jul-1951, 63 y.o.   MRN: [id number]   Patient ID: Katie [name], female   DOB: 1950-09-08, 63 y.o.   MRN: [id number]   [place] PHYSICAL MEDICINE & REHABILITATION     PROGRESS NOTE    Subjective/Complaints:  72/30.   63 year old patient who has a history of small cell lung cancer, who was admitted to the hospital with syncope, secondary to brain metastases.  Hospital course, complicated by acute respiratory failure.  Medical problems include hypertension and steroid-induced hyperglycemia.  The follicles associated with some head trauma.  She has malnutrition, secondary to her malignancy.  Slept well again. No new issues. Husband at bedside Ros limited due to cognition/language.  Past Medical History  Diagnosis Date  . Anxiety     takes lexapro    History   Social History  . Marital Status: Married    Spouse Name: N/A    Number of Children: N/A  . Years of Education: N/A   Occupational History  . Not on file.   Social History Main Topics  . Smoking status: Current Every Day Smoker -- 1.50 packs/day for 45 years  . Smokeless tobacco: Not on file  . Alcohol Use: Not on file  . Drug Use: Not on file  . Sexual Activity: Not on file   Other Topics Concern  . Not on file   Social History Narrative  . No narrative on file    Past Surgical History  Procedure Laterality Date  . Appendectomy    . Colon surgery      polyps  removed  . Craniotomy Right 11/12/2013    Procedure: RIGHT PARIETAL CRANIOTOMY;  Surgeon: Kristeen Miss, MD;  Location: [place] [facility];  Service: Neurosurgery;  Laterality: Right;  . 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  No Known Allergies  No current  facility-administered medications on file prior to encounter.   Current Outpatient Prescriptions on File Prior to Encounter  Medication Sig Dispense Refill  . aspirin EC 81 MG tablet Take 81 mg by mouth daily.      Marland Kitchen atorvastatin (LIPITOR) 10 MG tablet Take 10 mg by mouth every evening.      . cyanocobalamin 500 MCG tablet Take 500 mcg by mouth daily.      Marland Kitchen escitalopram (LEXAPRO) 10 MG tablet Take 10 mg by mouth every morning.        BP 115/63  Pulse 65  Temp(Src) 98.3 F (36.8 C) (Axillary)  Resp 20  Ht 5\' 7"  (1.702 m)  Wt 57.425 kg (126 lb 9.6 oz)  BMI 19.82 kg/m2  SpO2 99%    Intake/Output Summary (Last 24 hours) at 12/15/13 0804 Last data filed at 12/14/13 1700  Gross per 24 hour  Intake    720 ml  Output      0 ml  Net    720 ml    Patient Vitals for the past 24 hrs:  BP Temp Temp src Pulse Resp SpO2  12/15/13 0456 115/63 mmHg 98.3 F (36.8 C) Axillary 65 20 99 %  12/14/13 2107 108/59 mmHg 98.2 F (36.8 C) Axillary 65 19 65 %  12/14/13 1500 105/66 mmHg 98.5 F (36.9 C) Oral  66 20 99 %    Objective: Vital Signs: Blood pressure 115/63, pulse 65, temperature 98.3 F (36.8 C), temperature source Axillary, resp. rate 20, height 5\' 7"  (1.702 m), weight 57.425 kg (126 lb 9.6 oz), SpO2 99.00%.  No results found for this basename: WBC, HGB, HCT, PLT,  in the last 72 hours No results found for this basename: NA, K, CL, CO, GLUCOSE, BUN, CREATININE, CALCIUM,  in the last 72 hours CBG (last 3)   Recent Labs  12/15/13 0001 12/15/13 0354 12/15/13 0715  GLUCAP 141* 116* 120*    Wt Readings from Last 3 Encounters:  12/09/13 57.425 kg (126 lb 9.6 oz)  12/03/13 54.4 kg (119 lb 14.9 oz)  12/03/13 54.4 kg (119 lb 14.9 oz)    Physical Exam:  Constitutional: No distress.  Frail appearing, alert talkative but tends to keep eyes closed HENT:  Head: Normocephalic and atraumatic.  Right Ear: External ear normal.  Left Ear: External ear normal.  Eyes: Conjunctivae and  EOM are normal. Pupils are equal, round, and reactive to light.  Neck: No JVD present. No tracheal deviation present. No thyromegaly present.  Cardiovascular: Normal rate and regular rhythm. Exam reveals friction rub. Exam reveals no gallop.  No murmur heard.  Respiratory: No respiratory distress. She has no wheezes. She has no rales. She exhibits no tenderness.  Port a cath R chest wall GI: She exhibits no distension. There is no tenderness. There is no rebound.  Lymphadenopathy:  She has no cervical adenopathy.  Neurological:  Attention waxes and wanes. Still confused.    Language of confusion. Typically keeps eyes closed unless cued. moves all 4 limbs.   Assessment/Plan: 1. 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   Medical Problem List and Plan:  Right temporal occipital metastatic tumor (SCLC)--  -XRT ongoing--14 total treatments planned -steroid taper--decadron  1. DVT Prophylaxis/Anticoagulation: Pharmaceutical: Lovenox  2. Pain Management: tylenol, hydrocodone  3. Mood: Resumed Lexapro.   4. Neuropsych: This patient is not capable of making decisions on her own behalf.   -added ritalin for attention/focus--?benefit 5. FEN/malnutrition---D3 Nectars--- f/u swallow study recommended that she continue on this  -eating 100% at present  -continue to push fluids  6. Sz prophylaxis---keppra  LOS (Days) 9 A FACE TO FACE EVALUATION WAS PERFORMED  Marletta Lor 12/15/2013 8:04 AM

## 2013-12-15 NOTE — Progress Notes (Signed)
Physical Therapy Session Note  Patient Details  Name: Katie [name] MRN: [id number] Date of Birth: 28-Mar-1951  Today's Date: 12/15/2013 Time: 0900-1000 Time Calculation (min): 60 min  Short Term Goals: Week 1:  PT Short Term Goal 1 (Week 1): Pt will increase bed mobility to mod A.  PT Short Term Goal 2 (Week 1): Pt will increase transfer bed to chair, chair to bed to mod A.  PT Short Term Goal 3 (Week 1): Pt will increase ambulation with LRAD to mod A about 25 feet.  PT Short Term Goal 4 (Week 1): Pt will ascend/descend 2 stairs with B rails and max A.  PT Short Term Goal 5 (Week 1): Pt will propel w/c about 100 feet with B LEs and min A.   Skilled Therapeutic Interventions/Progress Updates:   Pt received semi reclined in bed with husband Jenny Reichmann present for session. Pt transferred supine > sit with HOB elevated and min assist for trunk, max assist to scoot LEs to edge of bed, donned pants and shoes EOB. W/c propulsion x 100 ft using BLEs with min assist for obstacle negotiation, max multimodal cues for sustained attention and initiation of task. Gait training 100 ft x 2 with therapist under RUE facilitating hip extension and L HHA from husband, mod-max A x 2. Negotiated up/down 5 stairs with max A from therapist under [place] from husband, max cues for sequencing and total assist for foot placement x 2. Pt attempting to sit on stairs x 2 with patient sitting on therapist's knee. Transfer training for sit <> stand from elevated mat for decreased pulling/grasping w/c armrest and emphasis on anterior weightshift and bearing weight through LEs. NuStep Level 3-4 using BUE/BLE for sustained attention to task and strengthening/coordination x 10 min total, able to attend for 3 min x 1 and 7 min x1 without need for cuing/redirection. Utilization of instructing patient to count number of steps during gait and on NuStep appeared to decrease verbal perseveration and distractibility, although pt continued to  keep eyes closed until cued, able to maintain open eyes 30-45 sec before closing. Returned to room and pt left sitting in w/c with quick release belt on and husband in room.   Therapy Documentation Precautions:  Precautions Precautions: Fall Precaution Comments: impulsive, decreased safety awareness Restrictions Weight Bearing Restrictions: No Pain: Pain Assessment Pain Assessment: No/denies pain Pain Score: 0-No pain  See FIM for current functional status  Therapy/Group: Individual Therapy  Laretta Alstrom 12/15/2013, 11:20 AM

## 2013-12-16 ENCOUNTER — Inpatient Hospital Stay (HOSPITAL_COMMUNITY): Payer: BC Managed Care – PPO | Admitting: Speech Pathology

## 2013-12-16 ENCOUNTER — Ambulatory Visit
Admit: 2013-12-16 | Discharge: 2013-12-16 | Disposition: A | Discharge status: Home / Self Care | Payer: BC Managed Care – PPO | Attending: Radiation Oncology | Admitting: Radiation Oncology

## 2013-12-16 ENCOUNTER — Inpatient Hospital Stay (HOSPITAL_COMMUNITY): Payer: BC Managed Care – PPO

## 2013-12-16 ENCOUNTER — Telehealth: Payer: Self-pay | Admitting: *Deleted

## 2013-12-16 ENCOUNTER — Other Ambulatory Visit: Payer: Self-pay | Admitting: Radiation Oncology

## 2013-12-16 ENCOUNTER — Encounter: Payer: Self-pay | Admitting: Radiation Oncology

## 2013-12-16 ENCOUNTER — Encounter: Payer: BC Managed Care – PPO | Admitting: Radiation Oncology

## 2013-12-16 ENCOUNTER — Encounter (HOSPITAL_COMMUNITY): Payer: BC Managed Care – PPO

## 2013-12-16 DIAGNOSIS — F341 Dysthymic disorder: Secondary | ICD-10-CM

## 2013-12-16 DIAGNOSIS — C7949 Secondary malignant neoplasm of other parts of nervous system: Secondary | ICD-10-CM

## 2013-12-16 DIAGNOSIS — I10 Essential (primary) hypertension: Secondary | ICD-10-CM

## 2013-12-16 DIAGNOSIS — C7931 Secondary malignant neoplasm of brain: Secondary | ICD-10-CM

## 2013-12-16 DIAGNOSIS — R4182 Altered mental status, unspecified: Secondary | ICD-10-CM

## 2013-12-16 LAB — GLUCOSE, CAPILLARY
GLUCOSE-CAPILLARY: 109 mg/dL — AB (ref 70–99)
GLUCOSE-CAPILLARY: 127 mg/dL — AB (ref 70–99)
GLUCOSE-CAPILLARY: 133 mg/dL — AB (ref 70–99)
Glucose-Capillary: 115 mg/dL — ABNORMAL HIGH (ref 70–99)
Glucose-Capillary: 128 mg/dL — ABNORMAL HIGH (ref 70–99)
Glucose-Capillary: 137 mg/dL — ABNORMAL HIGH (ref 70–99)
Glucose-Capillary: 141 mg/dL — ABNORMAL HIGH (ref 70–99)

## 2013-12-16 NOTE — Progress Notes (Signed)
   Weekly Management Note  inpatient  Completed Radiotherapy. Total Dose: 30 Gy  to brain  Narrative:  The patient presents for routine under treatment assessment on last day of radiotherapy.  CBCT/MVCT images/Port film x-rays were reviewed.  The chart was checked. She was taken back to [facility] unit by [facility] rather than to nursing for a face to face evaluation with me. But, I reviewed her chart. She is on a Decadron taper per the notes.  Plan for systemic chemotherapy  once she is an outpatient.   Physical Findings:  height is 5\' 7"  (1.702 m) and weight is 126 lb 9.6 oz (57.425 kg). Her oral temperature is 97.6 F (36.4 C). Her blood pressure is 101/64 and her pulse is 67. Her respiration is 18 and oxygen saturation is 99%.   Could not examine patient due to reasons above  Impression:  The patient has tolerated radiotherapy.  Plan:  Routine follow-up in one month.   ________________________________   Eppie Gibson, M.D.

## 2013-12-16 NOTE — Progress Notes (Signed)
[place] PHYSICAL MEDICINE & REHABILITATION     PROGRESS NOTE    Subjective/Complaints: No new issues. Awake and alert. Denies pain. Mental status unchanged Ros limited due to cognition/language.  Objective: Vital Signs: Blood pressure 115/73, pulse 66, temperature 98 F (36.7 C), temperature source Oral, resp. rate 17, height 5\' 7"  (1.702 m), weight 57.425 kg (126 lb 9.6 oz), SpO2 97.00%. No results found. No results found for this basename: WBC, HGB, HCT, PLT,  in the last 72 hours No results found for this basename: NA, K, CL, CO, GLUCOSE, BUN, CREATININE, CALCIUM,  in the last 72 hours CBG (last 3)   Recent Labs  12/16/13 0034 12/16/13 0400 12/16/13 0717  GLUCAP 127* 109* 115*    Wt Readings from Last 3 Encounters:  12/09/13 57.425 kg (126 lb 9.6 oz)  12/03/13 54.4 kg (119 lb 14.9 oz)  12/03/13 54.4 kg (119 lb 14.9 oz)    Physical Exam:  Constitutional: No distress.  Frail appearing, alert  HENT:  Head: Normocephalic and atraumatic.  Right Ear: External ear normal.  Left Ear: External ear normal.  Eyes: Conjunctivae and EOM are normal. Pupils are equal, round, and reactive to light.  Neck: No JVD present. No tracheal deviation present. No thyromegaly present.  Cardiovascular: Normal rate and regular rhythm. Exam reveals friction rub. Exam reveals no gallop.  No murmur heard.  Respiratory: No respiratory distress. She has no wheezes. She has no rales. She exhibits no tenderness.  GI: She exhibits no distension. There is no tenderness. There is no rebound.  Lymphadenopathy:  She has no cervical adenopathy.  Neurological:  Attention waxes and wanes. Still confused.    Language of confusion persistent. Typically keeps eyes closed unless cued. moves all 4 limbs. Senses pain.  left HH and likely ?diplopia  Skin: Skin is warm. Op site intact with some fluid still under skin---no real change Psychiatric:   confused   Assessment/Plan: 1. 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 Physiatrist is providing close team supervision and 24 hour management of active medical problems listed below. Physiatrist and rehab team continue to assess barriers to discharge/monitor patient progress toward functional and medical goals.  Discussed plan with family. Last XRT today apparently.    FIM: FIM - Bathing Bathing Steps Patient Completed: Chest;Right Arm;Left Arm;Abdomen;Front perineal area;Right upper leg;Left upper leg;Right lower leg (including foot);Left lower leg (including foot) Bathing: 4: Min-Patient completes 8-9 67f 10 parts or 75+ percent  FIM - Upper Body Dressing/Undressing Upper body dressing/undressing steps patient completed: Thread/unthread right sleeve of pullover shirt/dresss;Thread/unthread left sleeve of pullover shirt/dress;Put head through opening of pull over shirt/dress Upper body dressing/undressing: 4: Min-Patient completed 75 plus % of tasks FIM - Lower Body Dressing/Undressing Lower body dressing/undressing steps patient completed: Thread/unthread left pants leg Lower body dressing/undressing: 1: Total-Patient completed less than 25% of tasks  FIM - Toileting Toileting: 1: Total-Patient completed zero steps, helper did all 3  FIM - Radio producer Devices: Elevated toilet seat;Grab bars Toilet Transfers: 2-From toilet/BSC: Max A (lift and lower assist);2-To toilet/BSC: Max A (lift and lower assist)  FIM - Control and instrumentation engineer Devices: Arm rests Bed/Chair Transfer: 2: Bed > Chair or W/C: Max A (lift and lower assist);2: Chair or W/C > Bed: Max A (lift and lower assist);3: Supine > Sit: Mod A (lifting assist/Pt. 50-74%/lift 2 legs  FIM - Locomotion:  Wheelchair Distance: 100 Locomotion: Wheelchair: 2: Travels 50 - 149 ft with moderate assistance  (Pt: 50 - 74%) FIM - Locomotion: Ambulation Locomotion: Ambulation Assistive Devices: Other (comment) (therapist under RUE and spouse L HHA) Ambulation/Gait Assistance: 2: Max assist;1: +2 Total assist Locomotion: Ambulation: 1: Two helpers  Comprehension Comprehension Mode: Auditory Comprehension: 2-Understands basic 25 - 49% of the time/requires cueing 51 - 75% of the time  Expression Expression Mode: Verbal Expression: 2-Expresses basic 25 - 49% of the time/requires cueing 50 - 75% of the time. Uses single words/gestures.  Social Interaction Social Interaction: 2-Interacts appropriately 25 - 49% of time - Needs frequent redirection.  Problem Solving Problem Solving: 1-Solves basic less than 25% of the time - needs direction nearly all the time or does not effectively solve problems and may need a restraint for safety  Memory Memory: 1-Recognizes or recalls less than 25% of the time/requires cueing greater than 75% of the time   Medical Problem List and Plan:  Right temporal occipital metastatic tumor (SCLC)--  -XRT complete today?  -steroid taper--decadron -CTX as outpt  1. DVT Prophylaxis/Anticoagulation: Pharmaceutical: Lovenox  2. Pain Management: tylenol, hydrocodone  3. Mood: Resumed Lexapro.   4. Neuropsych: This patient is not capable of making decisions on her own behalf.   -added ritalin for attention/focus--?benefit 5. FEN/malnutrition---D3 Nectars--- f/u swallow study recommended that she continue on this  -eating 100% at present  -continue to push fluids   -water protocol 6. Sz prophylaxis---keppra  LOS (Days) 10 A FACE TO FACE EVALUATION WAS PERFORMED  Meredith Staggers 12/16/2013 7:51 AM

## 2013-12-16 NOTE — Telephone Encounter (Signed)
Called [facility] to find out if they picked-up the patient , spoke with [facility] and he stated that she has been picked-up this patient and they are transporting her back to [facility], informed Dr. Isidore Moos and she is aware of this.

## 2013-12-16 NOTE — Progress Notes (Signed)
Speech Language Pathology Weekly Progress and Session Notes  Patient Details  Name: Katie [name] MRN: [id number] Date of Birth: [birth year]  Beginning of progress report period: December 07, 2013 End of progress report period: December 16, 2013  Today's Date: 12/16/2013 Time: 6213-0865 Time Calculation (min): 40 min  Short Term Goals: Week 1: SLP Short Term Goal 1 (Week 1): Patient will consume PO with Mod level verbal cues for pacing and portion control. SLP Short Term Goal 1 - Progress (Week 1): Met SLP Short Term Goal 2 (Week 1): Patient will sustain attention to task for 1 minute with Mod verbal cues for redirection. SLP Short Term Goal 2 - Progress (Week 1): Not met SLP Short Term Goal 3 (Week 1): Patient will utilize external aids for orientation with Max assist.  SLP Short Term Goal 3 - Progress (Week 1): Not met SLP Short Term Goal 4 (Week 1): Patient will label 2 deficits with Max clinician cues.  SLP Short Term Goal 4 - Progress (Week 1): Not met SLP Short Term Goal 5 (Week 1): Patient will solve basic problems with Mod assist. SLP Short Term Goal 5 - Progress (Week 1): Not met SLP Short Term Goal 6 (Week 1): Patient maintain topic of conversation for 2 turns with Max clinician cues. SLP Short Term Goal 6 - Progress (Week 1): Met    New Short Term Goals: Week 2: SLP Short Term Goal 1 (Week 2): Patient will consume PO with Min level verbal cues for pacing and portion control. SLP Short Term Goal 2 (Week 2): Patient will sustain attention to task for 1 minute with Max verbal cues for redirection. SLP Short Term Goal 3 (Week 2): Patient will utilize external aids for orientation with Max assist.  SLP Short Term Goal 4 (Week 2): Patient will label 2 deficits with Max clinician cues.  SLP Short Term Goal 5 (Week 2): Patient will solve basic problems with Max assist. SLP Short Term Goal 6 (Week 2): Patient maintain topic of conversation for 2 turns with Mod clinician cues.  Weekly  Progress Updates: Pt has made minimal and inconsistent gains and has met 2 of 6 STG's this reporting period due to increased topic maintenance and swallowing function.  Currently, pt is consuming Dys. 3 textures with nectar-thick liquids with minimal overt s/s of aspiration and requires Mod multimodal cues for utilization of swallowing compensatory strategies. Pt had repeat MBS on 12/12/13 and continues to demonstrate intermittent silent aspiration with thin liquids, however, the patient was placed on the water protocol. Pt continues to require max-total multimodal cues for task initiation, attention with keeping her eyes open when performing tasks, problem solving, awareness, working memory and safety with functional and familiar tasks. The pt is also verbose at times with language of confusion but has demonstrated increased topic maintenance intermittently throughout sessions. [name] family attends sessions daily and education is ongoing. Pt would benefit from continued skilled SLP intervention to maximize cognitive and swallowing function and overall functional independence prior to discharge home.    Intensity: Minumum of 1-2 x/day, 30 to 90 minutes Frequency: 5 out of 7 days Duration/Length of Stay: 01/01/14 Treatment/Interventions: Cognitive remediation/compensation;Cueing hierarchy;Dysphagia/aspiration precaution training;Environmental controls;Functional tasks;Internal/external aids;Patient/family education;Speech/Language facilitation;Therapeutic Activities   Daily Session Skilled Therapeutic Interventions: Skilled treatment session focused on cognitive-linguistic goals. SLP facilitated session by providing Max A multimodal cues to sustain attention to task for ~30 seconds. Pt demonstrated decreased verbosity and language of confusion but required Max A multimodal cues for structured verbal  description task due to distractibility. Pt also continues to demonstrate decreased vision throughout  functional tasks, however, difficult to assess due to pt's cognitive function.  [name] husband and sister present throughout the session and provided education in regards to appropriate cueing and allowing patient time to process information. Both verbalized understanding. Continue with current plan of care.    FIM:  Comprehension Comprehension Mode: Auditory Comprehension: 2-Understands basic 25 - 49% of the time/requires cueing 51 - 75% of the time Expression Expression Mode: Verbal Expression: 2-Expresses basic 25 - 49% of the time/requires cueing 50 - 75% of the time. Uses single words/gestures. Social Interaction Social Interaction: 2-Interacts appropriately 25 - 49% of time - Needs frequent redirection. Problem Solving Problem Solving: 1-Solves basic less than 25% of the time - needs direction nearly all the time or does not effectively solve problems and may need a restraint for safety Memory Memory: 1-Recognizes or recalls less than 25% of the time/requires cueing greater than 75% of the time Pain No/Denies Pain  Therapy/Group: Individual Therapy  Katie [name] 12/16/2013, 3:17 PM

## 2013-12-16 NOTE — Progress Notes (Signed)
Occupational Therapy Weekly Progress Note  Patient Details  Name: Katie [name] MRN: [id number] Date of Birth: 11-05-1950  Beginning of progress report period: December 07, 2013 End of progress report period: December 16, 2013  Today's Date: 12/16/2013  Patient has met 3 of 5 short term goals.  Pt has made slow but steady progress with BADLs since admission.  Pt continues to require max multimodal cues for task initiation, sequencing, attention to task, and to keep eyes open when performing tasks.  Pt is hyperverbal throughout BADLs and requires max verbal cues to be quiet and follow commands and complete tasks.  Pt is easily distracted (internally and environmentally). [name] husband, niece, and sister have been present during therapy sessions and have intermittently provided appropriate encouragement and verbal cues. Patient continues to demonstrate the following deficits:  muscle weakness, decreased cardiorespiratoy endurance, decreased coordination, decreased visual perceptual skills and inattention to the left, decreased midline orientation and decreased attention to left, decreased initiation, decreased attention, decreased awareness, decreased problem solving, decreased safety awareness, decreased memory and delayed processing and decreased standing balance, decreased postural control, decreased balance strategies and difficulty maintaining precautions. and therefore will continue to benefit from skilled OT intervention to enhance overall performance with BADL and Reduce care partner burden.  Patient progressing toward long term goals..  Continue plan of care.  OT Short Term Goals Week 1:  OT Short Term Goal 1 (Week 1): Patient will complete wheelchair to toilet transfer with moderate assistance. OT Short Term Goal 1 - Progress (Week 1): Met OT Short Term Goal 2 (Week 1): Patient will groom at the sink with minimum assistance. OT Short Term Goal 2 - Progress (Week 1): Met OT Short Term Goal 3 (Week  1): Patient will complete upper body dressing with moderate assistance. OT Short Term Goal 3 - Progress (Week 1): Met OT Short Term Goal 4 (Week 1): Patient will complete lower body dressing with maximum assistance. OT Short Term Goal 4 - Progress (Week 1): Progressing toward goal OT Short Term Goal 5 (Week 1): Patient will complete toileting with maximum assistance. OT Short Term Goal 5 - Progress (Week 1): Progressing toward goal Week 2:  OT Short Term Goal 1 (Week 2): Pt will complete toileting with max A OT Short Term Goal 2 (Week 2): Pt will complete lower body dressing with max A OT Short Term Goal 3 (Week 2): Pt will perform toilet transfer with min A OT Short Term Goal 4 (Week 2): Pt will perform shower transfer with mod A  Skilled Therapeutic Interventions/Progress Updates:      Therapy Documentation Precautions:  Precautions Precautions: Fall Precaution Comments: impulsive, decreased safety awareness Restrictions Weight Bearing Restrictions: No  See FIM for current functional status  Therapy/Group: Individual Therapy  Leroy Libman 12/16/2013, 2:55 PM

## 2013-12-16 NOTE — Progress Notes (Signed)
Physical Therapy Weekly Progress Note  Patient Details  Name: Katie [name] MRN: [id number] Date of Birth: [birth year]  Beginning of progress report period: December 07, 2013 End of progress report period: December 16, 2013  Today's Date: 12/16/2013 Time: 1497-0263 Time Calculation (min): 25 min  Pt has made slow, but steady progress regarding functional mobility. Pt has only met 1/5 STGs, but has partly met remaining 4/5 goals due to inconsistent ability to perform functional mobility at target level. Pt currently requires mod A for bed mobility, mod A for w/c propulsion, max A for transfers and max-Ax2 persons for standing mobility including ambulation and stair negotiation. Pt continues to require max multimodal cues for sustained attention to therapeutic tasks due to verbal perseveration and distractibility as well as for alertness to maintain EO, initiation and sequencing. [name] family have been present during therapy sessions and have intermittently provided appropriate encouragement and verbal cues.   Patient continues to demonstrate the following deficits: decreased functional endurance, decreased strength, decreased balance, decreased coordination, L inattention, decreased midline orientation, decreased initiation, decreased sustained attention, decreased awareness, decreased problem solving, decreased safety awareness, decreased memory, delayed processing, decreased postural control, decreased balance strategies and therefore will continue to benefit from skilled PT intervention to enhance overall performance with safe functional mobility and reduce burden of care.   Patient progressing toward long term goals.  Continue plan of care.  PT Short Term Goals Week 1:  PT Short Term Goal 1 (Week 1): Pt will increase bed mobility to mod A.  PT Short Term Goal 1 - Progress (Week 1): Met PT Short Term Goal 2 (Week 1): Pt will increase transfer bed to chair, chair to bed to mod A.  PT Short Term Goal 2  - Progress (Week 1): Partly met PT Short Term Goal 3 (Week 1): Pt will increase ambulation with LRAD to mod A about 25 feet.  PT Short Term Goal 3 - Progress (Week 1): Partly met PT Short Term Goal 4 (Week 1): Pt will ascend/descend 2 stairs with B rails and max A.  PT Short Term Goal 4 - Progress (Week 1): Partly met PT Short Term Goal 5 (Week 1): Pt will propel w/c about 100 feet with B LEs and min A.  PT Short Term Goal 5 - Progress (Week 1): Partly met Week 2:  PT Short Term Goal 1 (Week 2): Pt to perform bed mobility with min Ax1person PT Short Term Goal 2 (Week 2): Pt to perform bed<>w/c tranfsers w/ mod Ax1person 75% of time PT Short Term Goal 3 (Week 2): Pt will ambulate 100' w/ mod A x1person 50% of time PT Short Term Goal 4 (Week 2): Pt will negotiate up/down 2 steps w/ max A x1person PT Short Term Goal 5 (Week 2): Pt will propel w/c x100' w/ B LE and min Ax1person 50% of time  Skilled Therapeutic Interventions/Progress Updates:  Pt received sitting in w/c, ready for therapy. Co-tx this session w/ recreational therapist this session w/ focus on sustained attention to therapeutic tasks. Pt demonstrated improved ability to maintain EO this session and req mod-max multimodal cues for redirection to therapeutic tasks due to verbal perseveration and distraction from family. Pt amb 175' room>day room w/ Ax2 persons (L UE over therapists shoulder and R hand holding w/ other therapist). Pt engaged in horticulture task w/ max A for standing balance, max cues for seq and Ax2 persons for sidestepping x10'. Pt req max A for all t/f sit<>stand. Pt sitting in  w/c at end of session w/ all needs in reach.   Therapy Documentation Precautions:  Precautions Precautions: Fall Precaution Comments: impulsive, decreased safety awareness Restrictions Weight Bearing Restrictions: No  See FIM for current functional status  Therapy/Group: Individual Therapy  Gilmore Laroche 12/16/2013, 9:31 AM

## 2013-12-16 NOTE — Progress Notes (Addendum)
Occupational Therapy Session Note  Patient Details  Name: Katie [name] MRN: [id number] Date of Birth: 1951-06-19  Today's Date: 12/16/2013  Session 1 Time: 0800-0900 Time Calculation (min): 60 min  Short Term Goals: Week 1:  OT Short Term Goal 1 (Week 1): Patient will complete wheelchair to toilet transfer with moderate assistance. OT Short Term Goal 2 (Week 1): Patient will groom at the sink with minimum assistance. OT Short Term Goal 3 (Week 1): Patient will complete upper body dressing with moderate assistance. OT Short Term Goal 4 (Week 1): Patient will complete lower body dressing with maximum assistance. OT Short Term Goal 5 (Week 1): Patient will complete toileting with maximum assistance.  Skilled Therapeutic Interventions/Progress Updates:    Pt engaged in bathing at shower level and dressing with sit<>stand from w/c at sink.  Pt was able to perform supine->sit EOB with supervision and max encouragement/verbal cues to complete task.  Pt continues to require mod A for sit<>stand at sink with max verbal cues to preparation and to actively engage in standing tasks at sink.  Pt requires max multimodal cues throughout BADLs for task initiation, sequencing, and attention to task.  [name] husband and sister were present throughout session and provided intermittent verbal cues/encouragement. Focus on transitional movements, sit<>stand, activity tolerance, dynamic standing, task initiation, sequencing, attention to task, and safety awareness.  Therapy Documentation Precautions:  Precautions Precautions: Fall Precaution Comments: impulsive, decreased safety awareness Restrictions Weight Bearing Restrictions: No Pain: Pain Assessment Pain Assessment: No/denies pain  See FIM for current functional status  Therapy/Group: Individual Therapy  Session 2 Time: 1300-1325 Pt denies pain Individual Therapy  Pt engaged in standing activities using RUE to place back& red checkers on checker  board.  Pt required max multimodal cues for BUE and BLE placement prior to sit->stand at hi-lo table.  Pt required max multimodal cues to initiate tasks and for redirection to task.  Pt required max verbal cues to keep eyes open throughout task and to follow commands.  Pt was hyperverbal throughout session and often talked over therapist when giving commands.  Pt required steady A while standing but stood approx 5 mins while performing task.  Focus on dynamic standing balance while performing tasks to address increased independence with LB bathing and dressing tasks.  Focus on task initiation, attention to task, and activity tolerance.  [name] husband present to observe therapy.  Leroy Libman 12/16/2013, 9:04 AM

## 2013-12-17 ENCOUNTER — Encounter (HOSPITAL_COMMUNITY): Payer: BC Managed Care – PPO

## 2013-12-17 ENCOUNTER — Inpatient Hospital Stay (HOSPITAL_COMMUNITY): Payer: BC Managed Care – PPO | Admitting: Speech Pathology

## 2013-12-17 ENCOUNTER — Inpatient Hospital Stay (HOSPITAL_COMMUNITY): Payer: BC Managed Care – PPO

## 2013-12-17 DIAGNOSIS — C7931 Secondary malignant neoplasm of brain: Secondary | ICD-10-CM

## 2013-12-17 DIAGNOSIS — R4182 Altered mental status, unspecified: Secondary | ICD-10-CM

## 2013-12-17 DIAGNOSIS — F341 Dysthymic disorder: Secondary | ICD-10-CM

## 2013-12-17 DIAGNOSIS — I10 Essential (primary) hypertension: Secondary | ICD-10-CM

## 2013-12-17 DIAGNOSIS — C7949 Secondary malignant neoplasm of other parts of nervous system: Secondary | ICD-10-CM

## 2013-12-17 LAB — GLUCOSE, CAPILLARY
GLUCOSE-CAPILLARY: 139 mg/dL — AB (ref 70–99)
GLUCOSE-CAPILLARY: 121 mg/dL — AB (ref 70–99)
Glucose-Capillary: 125 mg/dL — ABNORMAL HIGH (ref 70–99)
Glucose-Capillary: 146 mg/dL — ABNORMAL HIGH (ref 70–99)
Glucose-Capillary: 117 mg/dL — ABNORMAL HIGH (ref 70–99)

## 2013-12-17 MED ORDER — METHYLPHENIDATE HCL 5 MG PO TABS
10.0000 mg | ORAL_TABLET | Freq: Two times a day (BID) | ORAL | Status: DC
  Administered 2013-12-17 – 2013-12-26 (×18): 10 mg via ORAL
  Filled 2013-12-17 (×18): qty 2

## 2013-12-17 NOTE — Progress Notes (Deleted)
PMR Admission Coordinator Pre-Admission Assessment  Patient: Katie [name] is an 63 y.o., female MRN: [id number] DOB: [birth year] Height: 5' 7"  (170.2 cm) Weight: 57.425 kg (126 lb 9.6 oz)              Insurance Information HMO:      PPO: Yes     PCP:       IPA:       80/20:       OTHER:  Group # [id number] PRIMARY: [employer] of [place]      Policy#: [id number]      Subscriber: [place] Name: Delight Stare      Phone#: [phone]     Fax#: [phone] Pre-Cert#: [id number] X 2 weeks with update due on 12/18/13 after conference     Employer: Not employed Benefits:  Phone #: [phone]     Name: Ciara Eff. Date: 08/22/13     Deduct: $3000(met all)      Out of Pocket Max: $6600(met all)      Life Max: unlimited CIR: 70% /auth      SNF: 70% w/auth Outpatient: 70%  With 30 visit max     Co-Pay: 30% Home Health: 70% w/auth and 30 visit max      Co-Pay: 30% DME: 70%     Co-Pay: 30% Providers: in network   Emergency [place]   Name Relation Home Work Mobile   Gwinner,John Spouse [phone]  [phone]   Brown,Beverly Daughter [phone]  [phone]     Current Medical History  Patient Admitting Diagnosis: Right temporal occipital metastatic tumor (SCLC)    History of Present Illness: A 63 y.o. female with history of anxiety disorder otherwise in good health. She was admitted via [place] on 11/11/13 past syncopal event and sustained a fall with injury to left frontal region and obtunded at admission. CT brain done demonstrates at least 2 large right-sided lesions one frontally and one parietal occipital area with 2 mm of shift with subfalcine herniation. Husband reported 6 week history of confusion with disorientation and falls. MRI brain with 4.4 x 6 cm right temporal parietal occipital mass with extensive vasogenic edema and high right frontal lobe mass 2.3 X 2.3 cm likely metastatic disease or possibly primary brain tumors as well as 10 mm Right to left  shift with hydrocephalus. Patient taken to OR for Right parietal occipital crani for resection of tumor on 11/12/13 by Dr. Ellene Route. 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 CT lungs with LLL mass with subcarinal adenopathy. She was extubated without difficulty on 11/15/13. She was transferred to [facility] for Brain XRT as recommended by Dr. Isidore Moos. She was evaluated by Dr. Marin Olp and chemo recommended for likely micrometastatic disease. She developed CSF leak requiring revision of incision with subgleal drain on 11/22/13. Mentation has fluctuated and she was placed on Vancomycin for HCAP. Drain removed but patient with continued subgaleal fluid collections therefore LP done with resolution. She was transferred back to [facility] and and whole brain XRT to be initiated today.  Scheduled to have 14 radiation treatments.  Patient has had improvement in mentation but is easily distracted, verbose, impulsive with deficits in problem solving, judgement, awareness and memory. She is oriented to self only. Patient with poor posture, question diplopia as well as ataxic gait with poor motor control. MBS done with evidence of aspiration of thin liquids and patient on Dysphagia 3, nectar liquids. Therapy team recommending CIR for further therapies. Portacath placed  12/05/13 with plans for chemo after radiation therapy completed and patient stronger.   Past Medical History  Past Medical History  Diagnosis Date  . Anxiety     takes lexapro    Family History  family history is not on file.  Prior Rehab/Hospitalizations:  None   Current Medications  Current facility-administered medications:acetaminophen (TYLENOL) tablet 325-650 mg, 325-650 mg, Oral, Q4H PRN, Ivan Anchors Love, PA-C;  alum & mag hydroxide-simeth (MAALOX/MYLANTA) 200-200-20 MG/5ML suspension 30 mL, 30 mL, Oral, Q4H PRN, Ivan Anchors Love, PA-C;  antiseptic oral rinse (BIOTENE) solution 15 mL, 15 mL, Mouth Rinse, QID, Pamela S  Love, PA-C, 15 mL at 12/17/13 1157 atorvastatin (LIPITOR) tablet 10 mg, 10 mg, Oral, QPM, Pamela S Love, PA-C, 10 mg at 12/16/13 1737;  bisacodyl (DULCOLAX) suppository 10 mg, 10 mg, Rectal, Daily PRN, Pamela S Love, PA-C;  cyanocobalamin tablet 500 mcg, 500 mcg, Oral, Daily, Bary Leriche, PA-C, 500 mcg at 12/17/13 7989;  dexamethasone (DECADRON) tablet 6 mg, 6 mg, Oral, Q12H, Volanda Napoleon, MD, 6 mg at 12/17/13 2119 diphenhydrAMINE (BENADRYL) 12.5 MG/5ML elixir 12.5-25 mg, 12.5-25 mg, Oral, Q6H PRN, Ivan Anchors Love, PA-C;  enoxaparin (LOVENOX) injection 40 mg, 40 mg, Subcutaneous, Q24H, Pamela S Love, PA-C, 40 mg at 12/17/13 4174;  escitalopram (LEXAPRO) tablet 10 mg, 10 mg, Oral, Daily, Pamela S Love, PA-C, 10 mg at 12/17/13 0814 feeding supplement (ENSURE) (ENSURE) pudding 1 Container, 1 Container, Oral, TID BM, Bary Leriche, PA-C, 1 Container at 12/17/13 0743;  guaiFENesin-dextromethorphan (ROBITUSSIN DM) 100-10 MG/5ML syrup 5-10 mL, 5-10 mL, Oral, Q6H PRN, Ivan Anchors Love, PA-C;  heparin lock flush 100 unit/mL, 500 Units, Intracatheter, Q30 days, Meredith Staggers, MD heparin lock flush 100 unit/mL, 500 Units, Intracatheter, PRN, Meredith Staggers, MD, 500 Units at 12/12/13 0848;  hydrocerin (EUCERIN) cream, , Topical, BID, Pamela S Love, PA-C;  HYDROcodone-acetaminophen (NORCO/VICODIN) 5-325 MG per tablet 1 tablet, 1 tablet, Oral, Q6H PRN, Ivan Anchors Love, PA-C;  insulin aspart (novoLOG) injection 0-15 Units, 0-15 Units, Subcutaneous, 6 times per day, Pamela S Love, PA-C, 2 Units at 12/17/13 1156 ipratropium-albuterol (DUONEB) 0.5-2.5 (3) MG/3ML nebulizer solution 3 mL, 3 mL, Nebulization, Q4H PRN, Pamela S Love, PA-C;  levETIRAcetam (KEPPRA) tablet 500 mg, 500 mg, Oral, BID, Pamela S Love, PA-C, 500 mg at 12/17/13 4818;  loperamide (IMODIUM) capsule 2 mg, 2 mg, Oral, PRN, Bary Leriche, PA-C;  methylphenidate (RITALIN) tablet 10 mg, 10 mg, Oral, BID WC, Meredith Staggers, MD, 10 mg at 12/17/13  1156 multivitamin with minerals tablet 1 tablet, 1 tablet, Oral, Daily, Bary Leriche, PA-C, 1 tablet at 12/17/13 0743;  pantoprazole (PROTONIX) EC tablet 40 mg, 40 mg, Oral, Daily, Bary Leriche, PA-C, 40 mg at 12/17/13 5631;  polyethylene glycol (MIRALAX / GLYCOLAX) packet 17 g, 17 g, Oral, Daily PRN, Ivan Anchors Love, PA-C;  prochlorperazine (COMPAZINE) injection 5-10 mg, 5-10 mg, Intramuscular, Q6H PRN, Ivan Anchors Love, PA-C prochlorperazine (COMPAZINE) suppository 12.5 mg, 12.5 mg, Rectal, Q6H PRN, Ivan Anchors Love, PA-C;  prochlorperazine (COMPAZINE) tablet 5-10 mg, 5-10 mg, Oral, Q6H PRN, Ivan Anchors Love, PA-C;  RESOURCE THICKENUP CLEAR, , Oral, PRN, Ivan Anchors Love, PA-C;  sodium chloride 0.9 % injection 10-40 mL, 10-40 mL, Intracatheter, PRN, Meredith Staggers, MD, 10 mL at 12/12/13 0848;  traZODone (DESYREL) tablet 25-50 mg, 25-50 mg, Oral, QHS PRN, Bary Leriche, PA-C  Patients Current Diet: Dysphagia 3 diet with nectar thick liquids  Precautions / Restrictions Precautions Precautions: Fall  Precaution Comments: impulsive, decreased safety awareness Restrictions Weight Bearing Restrictions: No   Prior Activity Level    Home Assistive Devices / Equipment    Prior Functional Level    Current Functional Level Cognition  Arousal/Alertness: Awake/alert Overall Cognitive Status: Impaired/Different from baseline Orientation Level: Oriented to person;Oriented to place;Disoriented to time;Disoriented to situation Attention: Sustained;Focused Focused Attention: Appears intact Sustained Attention: Impaired Sustained Attention Impairment: Verbal basic;Functional basic Selective Attention: Impaired Memory: Impaired Memory Impairment: Storage deficit;Decreased recall of new information;Retrieval deficit Decreased Short Term Memory: Verbal basic;Functional basic Awareness: Impaired Awareness Impairment: Intellectual impairment Problem Solving: Impaired Problem Solving Impairment: Verbal  basic;Functional basic Executive Function:  (all impiared due to lower level deficits) Reasoning: Impaired Decision Making: Impaired Behaviors: Impulsive;Perseveration Safety/Judgment: Impaired    Extremity Assessment (includes Sensation/Coordination)          ADLs       Mobility       Transfers       Ambulation / Gait / Stairs / Pharmacist, hospital (Feet): 5 Feet Wheelchair Mobility Distance: 100    Posture / Balance Static Sitting Balance Static Sitting - Balance Support: Feet supported;Bilateral upper extremity supported Static Sitting - Level of Assistance: 5: Stand by assistance;3: Mod assist Static Standing Balance Static Standing - Balance Support: During functional activity Static Standing - Level of Assistance: 1: +1 Total assist;2: Max assist Dynamic Standing Balance Dynamic Standing - Balance Support: During functional activity Dynamic Standing - Level of Assistance: 2: Max assist;1: +1 Total assist    Special needs/care consideration BiPAP/CPAP No CPM No Continuous Drip IV No Dialysis No        Life Vest No Oxygen No Special Bed No Trach Size No Wound Vac (area) No      Skin Bruises easily                              Bowel mgmt: Had BM 12/04/13 Bladder mgmt: Voiding with incontinence at times; using BSC Diabetic mgmt No Daily radiation at [facility] daily via [place]: Spouse/significant other  Lives With: Spouse Available Help at Discharge: Family;Friend(s);Available 24 hours/day Type of Home: House Home Layout: Two level;Able to live on main level with bedroom/bathroom Alternate Level Stairs-Rails: Right Alternate Level Stairs-Number of Steps: one flight Home Access: Stairs to enter Entrance Stairs-Rails: None (have to handles beside door to hold onto) Entrance Stairs-Number of Steps: 3 Bathroom Shower/Tub: Chiropodist:  Standard Bathroom Accessibility: Yes How Accessible: Accessible via walker Additional Comments: pt's husband provided home living info and states he will be available 24/7 at home.   Discharge Living Setting Does the patient have any problems obtaining your medications?: No  Social/Family/Support Systems Patient Roles: Spouse Anticipated Caregiver: Husband Ability/Limitations of Caregiver: Husband is retired and can Veterinary surgeon Availability: 24/7  Goals/Additional Needs Expected length of stay: 3-4 weeks   Decrease burden of Care through IP rehab admission: N/A  Possible need for SNF placement upon discharge: Not anticipated   Patient Condition: This patient's medical and functional status has changed since the consult dated: 12/03/13 in which the Rehabilitation Physician determined and documented that the patient's condition is appropriate for intensive rehabilitative care in an inpatient rehabilitation facility. See "History of Present Illness" (above) for medical update. Functional changes are: Currently requiring max assist +2 to ambulate 58 ft with EVA walker. Patient's medical and functional status update has  been discussed with the Rehabilitation physician and patient remains appropriate for inpatient rehabilitation. Will admit to inpatient rehab today.  Preadmission Screen Completed By:  Cleatrice Burke, 12/17/2013 3:19 PM ______________________________________________________________________   Discussed status with Dr. Naaman Plummer  On 12/06/13 at  [phone] and received telephone approval for admission today.  Admission Coordinator:  Cleatrice Burke, time 3159 Date 12/06/2013.

## 2013-12-17 NOTE — Progress Notes (Signed)
Occupational Therapy Session Note  Patient Details  Name: Katie [name] MRN: [id number] Date of Birth: 08/28/1950  Today's Date: 12/17/2013 Time: 0800-0856 Time Calculation (min): 56 min  Short Term Goals: Week 2:  OT Short Term Goal 1 (Week 2): Pt will complete toileting with max A OT Short Term Goal 2 (Week 2): Pt will complete lower body dressing with max A OT Short Term Goal 3 (Week 2): Pt will perform toilet transfer with min A OT Short Term Goal 4 (Week 2): Pt will perform shower transfer with mod A  Skilled Therapeutic Interventions/Progress Updates:    Pt engaged in BADL retraining including bathing at shower level and dressing with sit<>stand from w/c at sink.  Pt required one verbal request to bathe UB but then required max verbal cues to continue to bathe remainder of body.  Pt required max A for w/c<>shower seat transfer with max verbal cues for hand and feet placement.  Pt required mod verbal cues to initiate and complete dressing tasks.  Pt was able to don t-shirt with supervision but required max A to don pullover shirt.  Pt is able to don socks but requires max A to thread pants.  Pt was able to pull up pants with min A this morning.  Pt continues to require mod verbal cues to keep eyes open during therapy and often is unable to locate items in environment without verbal cues.  Focus on activity tolerance, transfers, standing balance, task initiation, attention to task, continued family education, and safety awareness.   Therapy Documentation Precautions:  Precautions Precautions: Fall Precaution Comments: impulsive, decreased safety awareness Restrictions Weight Bearing Restrictions: No Pain: Pain Assessment Pain Assessment: No/denies pain  See FIM for current functional status  Therapy/Group: Individual Therapy  Leroy Libman 12/17/2013, 9:01 AM

## 2013-12-17 NOTE — Progress Notes (Signed)
Speech Language Pathology Daily Session Note  Patient Details  Name: Katie [name] MRN: [id number] Date of Birth: 05/12/1951  Today's Date: 12/17/2013 Time: 0900-0928 Time Calculation (min): 28 min  Short Term Goals: Week 2: SLP Short Term Goal 1 (Week 2): Patient will consume PO with Min level verbal cues for pacing and portion control. SLP Short Term Goal 2 (Week 2): Patient will sustain attention to task for 1 minute with Max verbal cues for redirection. SLP Short Term Goal 3 (Week 2): Patient will utilize external aids for orientation with Max assist.  SLP Short Term Goal 4 (Week 2): Patient will label 2 deficits with Max clinician cues.  SLP Short Term Goal 5 (Week 2): Patient will solve basic problems with Max assist. SLP Short Term Goal 6 (Week 2): Patient maintain topic of conversation for 2 turns with Mod clinician cues.  Skilled Therapeutic Interventions: Skilled treatment session focused on dysphagia and cognitive goals. SLP facilitated session by providing Mod verbal and visual cues for functional problem solving during oral care task. Pt consumed trials of water via cup with cough X 3, suspect due to large sips and required Mod verbal cues to utilize a slow pace and small sips throughout the session.  Pt required total A t recall morning therapy events but demonstrated appropriate problem solving with use of schedule with Min visual cues. Pt demonstrated decreased verbosity and increased topic maintenance with increased sustained attention today. Continue with current plan of care.    FIM:  Comprehension Comprehension Mode: Auditory Comprehension: 3-Understands basic 50 - 74% of the time/requires cueing 25 - 50%  of the time Expression Expression Mode: Verbal Expression: 3-Expresses basic 50 - 74% of the time/requires cueing 25 - 50% of the time. Needs to repeat parts of sentences. Social Interaction Social Interaction: 3-Interacts appropriately 50 - 74% of the time - May be  physically or verbally inappropriate. Problem Solving Problem Solving: 2-Solves basic 25 - 49% of the time - needs direction more than half the time to initiate, plan or complete simple activities Memory Memory: 1-Recognizes or recalls less than 25% of the time/requires cueing greater than 75% of the time FIM - Eating Eating Activity: 5: Supervision/cues  Pain Pain Assessment Pain Assessment: No/denies pain  Therapy/Group: Individual Therapy  Buzzy Han 12/17/2013, 11:33 AM

## 2013-12-17 NOTE — Progress Notes (Signed)
Physical Therapy Session Note  Patient Details  Name: Katie [name] MRN: [id number] Date of Birth: 06-13-1951  Today's Date: 12/17/2013 Time: 1000-1050 Time Calculation (min): 50 min  Short Term Goals: Week 2:  PT Short Term Goal 1 (Week 2): Pt to perform bed mobility with min Ax1person PT Short Term Goal 2 (Week 2): Pt to perform bed<>w/c tranfsers w/ mod Ax1person 75% of time PT Short Term Goal 3 (Week 2): Pt will ambulate 100' w/ mod A x1person 50% of time PT Short Term Goal 4 (Week 2): Pt will negotiate up/down 2 steps w/ max A x1person PT Short Term Goal 5 (Week 2): Pt will propel w/c x100' w/ B LE and min Ax1person 50% of time  Skilled Therapeutic Interventions/Progress Updates:  1:1. Pt received sitting in w/c, ready for therapy. Pt req max multimodal cues to maintain alertness this session and sustained attention to tasks, pt only able to maintain EO approx. 50% of session. Pt able to amb 250' w/ max A x1person and close w/c follow by pts husband. Trial use of R AFO with heel wedge to decreased knee hyperextension, with noted improvement and improved stability. During ambulation, pts R knee buckled twice, anticipate due to combination of fatigue and decreased attention to R LE. Assessment of functional mobility in therapy apartment, pt able to demonstrate t/f sup<>sit w/ min A and max A for t/f sit<>stand from bed and chair. Pt req mod A for completion of car transfer w/ max cueing for seq. Attempted stair negotiation, but due to fatigue pt attempting to sit down vs. Going up step, req A x2persons to help pt to chair. Pt propelled w/c x175' w/ B LE req mod A for propulsion, but max multimodal cueing for alertness and attending to consistent L LE advancement. Pt req mod A for transition back to bed at end of session, missed 10 min due to fatigue. Pt semi-reclined in bed at end of session w/ all needs in reach, bed alarm on and husband in room.   Therapy Documentation Precautions:   Precautions Precautions: Fall Precaution Comments: impulsive, decreased safety awareness Restrictions Weight Bearing Restrictions: No General: Amount of Missed PT Time (min): 10 Minutes Missed Time Reason: Patient fatigue  See FIM for current functional status  Therapy/Group: Individual Therapy  Gilmore Laroche 12/17/2013, 10:54 AM

## 2013-12-17 NOTE — Progress Notes (Signed)
[place] PHYSICAL MEDICINE & REHABILITATION     PROGRESS NOTE    Subjective/Complaints: No new issues. Denies pain. Slept well . Ros limited due to cognition/language.  Objective: Vital Signs: Blood pressure 109/60, pulse 66, temperature 97.7 F (36.5 C), temperature source Oral, resp. rate 18, height 5\' 7"  (1.702 m), weight 57.425 kg (126 lb 9.6 oz), SpO2 99.00%. No results found. No results found for this basename: WBC, HGB, HCT, PLT,  in the last 72 hours No results found for this basename: NA, K, CL, CO, GLUCOSE, BUN, CREATININE, CALCIUM,  in the last 72 hours CBG (last 3)   Recent Labs  12/16/13 2353 12/17/13 0409 12/17/13 0735  GLUCAP 141* 139* 125*    Wt Readings from Last 3 Encounters:  12/09/13 57.425 kg (126 lb 9.6 oz)  12/03/13 54.4 kg (119 lb 14.9 oz)  12/03/13 54.4 kg (119 lb 14.9 oz)    Physical Exam:  Constitutional: No distress.  Frail appearing, alert  HENT:  Head: Normocephalic and atraumatic.  Right Ear: External ear normal.  Left Ear: External ear normal.  Eyes: Conjunctivae and EOM are normal. Pupils are equal, round, and reactive to light.  Neck: No JVD present. No tracheal deviation present. No thyromegaly present.  Cardiovascular: Normal rate and regular rhythm. Exam reveals friction rub. Exam reveals no gallop.  No murmur heard.  Respiratory: No respiratory distress. She has no wheezes. She has no rales. She exhibits no tenderness.  GI: She exhibits no distension. There is no tenderness. There is no rebound.  Lymphadenopathy:  She has no cervical adenopathy.  Neurological:  Attention waxes and wanes. Still confused.    Language of confusion persistent. Typically keeps eyes closed unless cued. moves all 4 limbs. Senses pain.  left HH and likely ?diplopia  Skin: Skin is warm. Op site intact with some fluid still under skin---no real change Psychiatric:   confused   Assessment/Plan: 1. 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 Physiatrist is providing close team supervision and 24 hour management of active medical problems listed below. Physiatrist and rehab team continue to assess barriers to discharge/monitor patient progress toward functional and medical goals.  Extend therapy day as pt tolerates.    FIM: FIM - Bathing Bathing Steps Patient Completed: Chest;Right Arm;Left Arm;Abdomen;Front perineal area;Right upper leg;Left upper leg;Right lower leg (including foot);Left lower leg (including foot) Bathing: 4: Min-Patient completes 8-9 30f 10 parts or 75+ percent  FIM - Upper Body Dressing/Undressing Upper body dressing/undressing steps patient completed: Thread/unthread right sleeve of pullover shirt/dresss;Thread/unthread left sleeve of pullover shirt/dress;Put head through opening of pull over shirt/dress;Pull shirt over trunk Upper body dressing/undressing: 5: Supervision: Safety issues/verbal cues FIM - Lower Body Dressing/Undressing Lower body dressing/undressing steps patient completed: Thread/unthread left pants leg;Don/Doff left sock;Don/Doff right sock Lower body dressing/undressing: 2: Max-Patient completed 25-49% of tasks  FIM - Toileting Toileting steps completed by patient: Performs perineal hygiene Toileting: 2: Max-Patient completed 1 of 3 steps  FIM - Radio producer Devices: Bedside commode Toilet Transfers: 2-To toilet/BSC: Max A (lift and lower assist);2-From toilet/BSC: Max A (lift and lower assist)  FIM - Engineer, site Assistive Devices: Arm rests Bed/Chair Transfer: 3: Supine > Sit: Mod A (lifting assist/Pt. 50-74%/lift 2 legs;3: Sit > Supine: Mod A (lifting assist/Pt. 50-74%/lift 2 legs);2: Bed > Chair or W/C: Max A (lift and lower assist);2: Chair or W/C >  Bed: Max A (lift and lower assist)  FIM - Locomotion:  Wheelchair Distance: 100 Locomotion: Wheelchair: 1: Total Assistance/staff pushes wheelchair (Pt<25%) FIM - Locomotion: Ambulation Locomotion: Ambulation Assistive Devices: Other (comment) (Pt L UE around therapists shoulder, holding 2nd persons hand) Ambulation/Gait Assistance: 1: +2 Total assist Locomotion: Ambulation: 1: Two helpers  Comprehension Comprehension Mode: Auditory Comprehension: 2-Understands basic 25 - 49% of the time/requires cueing 51 - 75% of the time  Expression Expression Mode: Verbal Expression: 2-Expresses basic 25 - 49% of the time/requires cueing 50 - 75% of the time. Uses single words/gestures.  Social Interaction Social Interaction: 2-Interacts appropriately 25 - 49% of time - Needs frequent redirection.  Problem Solving Problem Solving: 1-Solves basic less than 25% of the time - needs direction nearly all the time or does not effectively solve problems and may need a restraint for safety  Memory Memory: 1-Recognizes or recalls less than 25% of the time/requires cueing greater than 75% of the time   Medical Problem List and Plan:  Right temporal occipital metastatic tumor (SCLC)--  -XRT completed  -steroid taper--decadron -CTX as outpt  1. DVT Prophylaxis/Anticoagulation: Pharmaceutical: Lovenox  2. Pain Management: tylenol, hydrocodone  3. Mood: Resumed Lexapro.   4. Neuropsych: This patient is not capable of making decisions on her own behalf.   -increase ritalin to see if we see any benefit with attention or focus. 5. FEN/malnutrition---D3 Nectars--- f/u swallow study recommended that she continue on this  -eating 100% at present  -continue to push fluids   -water protocol---check lytes tomorrow 6. Sz prophylaxis---keppra  LOS (Days) 11 A FACE TO FACE EVALUATION WAS PERFORMED  Meredith Staggers 12/17/2013 7:50 AM

## 2013-12-17 NOTE — Progress Notes (Signed)
  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   She remained in rehabilitative care during radiotherapy.  Plan: The patient has completed radiation treatment. Continue steroid taper per physicians in rehabilitative unit. The patient will return to radiation oncology clinic for routine followup in one month.   -----------------------------------  Eppie Gibson, MD

## 2013-12-18 ENCOUNTER — Ambulatory Visit (HOSPITAL_COMMUNITY): Payer: BC Managed Care – PPO

## 2013-12-18 ENCOUNTER — Inpatient Hospital Stay (HOSPITAL_COMMUNITY): Payer: BC Managed Care – PPO

## 2013-12-18 ENCOUNTER — Inpatient Hospital Stay (HOSPITAL_COMMUNITY): Payer: BC Managed Care – PPO | Admitting: Speech Pathology

## 2013-12-18 ENCOUNTER — Encounter (HOSPITAL_COMMUNITY): Payer: BC Managed Care – PPO

## 2013-12-18 DIAGNOSIS — I10 Essential (primary) hypertension: Secondary | ICD-10-CM

## 2013-12-18 DIAGNOSIS — R4182 Altered mental status, unspecified: Secondary | ICD-10-CM

## 2013-12-18 DIAGNOSIS — C7931 Secondary malignant neoplasm of brain: Secondary | ICD-10-CM

## 2013-12-18 DIAGNOSIS — C7949 Secondary malignant neoplasm of other parts of nervous system: Secondary | ICD-10-CM

## 2013-12-18 DIAGNOSIS — F341 Dysthymic disorder: Secondary | ICD-10-CM

## 2013-12-18 LAB — CBC
HCT: 33.6 % — ABNORMAL LOW (ref 36.0–46.0)
Hemoglobin: 11.4 g/dL — ABNORMAL LOW (ref 12.0–15.0)
MCH: 32.3 pg (ref 26.0–34.0)
MCHC: 33.9 g/dL (ref 30.0–36.0)
MCV: 95.2 fL (ref 78.0–100.0)
Platelets: 198 10*3/uL (ref 150–400)
RBC: 3.53 MIL/uL — ABNORMAL LOW (ref 3.87–5.11)
RDW: 15.2 % (ref 11.5–15.5)
WBC: 8.9 10*3/uL (ref 4.0–10.5)

## 2013-12-18 LAB — BASIC METABOLIC PANEL
BUN: 25 mg/dL — ABNORMAL HIGH (ref 6–23)
CALCIUM: 8.5 mg/dL (ref 8.4–10.5)
CHLORIDE: 95 meq/L — AB (ref 96–112)
CO2: 28 mEq/L (ref 19–32)
CREATININE: 0.47 mg/dL — AB (ref 0.50–1.10)
GFR calc non Af Amer: >90 mL/min (ref >90)
Glucose, Bld: 113 mg/dL — ABNORMAL HIGH (ref 70–99)
Potassium: 4.6 mEq/L (ref 3.7–5.3)
SODIUM: 134 meq/L — AB (ref 137–147)

## 2013-12-18 LAB — GLUCOSE, CAPILLARY
GLUCOSE-CAPILLARY: 153 mg/dL — AB (ref 70–99)
GLUCOSE-CAPILLARY: 132 mg/dL — AB (ref 70–99)
Glucose-Capillary: 116 mg/dL — ABNORMAL HIGH (ref 70–99)
Glucose-Capillary: 129 mg/dL — ABNORMAL HIGH (ref 70–99)
Glucose-Capillary: 130 mg/dL — ABNORMAL HIGH (ref 70–99)
Glucose-Capillary: 142 mg/dL — ABNORMAL HIGH (ref 70–99)

## 2013-12-18 NOTE — Patient Care Conference (Signed)
Inpatient RehabilitationTeam Conference and Plan of Care Update Date: 12/17/2013   Time: 2:45 PM    Patient Name: Katie [name]      Medical Record Number: [id number]  Date of Birth: [birth year] Sex: Female         Room/Bed: 4W02C/4W02C-01 Payor Info: Payor: [place] / Plan: [employer] [place] PPO / Product Type: *No Product type* /    Admitting Diagnosis: LUNG CA WITH BRAIN METS  Admit Date/Time:  12/06/2013  4:05 PM Admission Comments: No comment available   Primary Diagnosis:  <principal problem not specified> Principal Problem: <principal problem not specified>  Patient Active Problem List   Diagnosis Date Noted  . Brain tumor 12/06/2013  . 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 Malnutrition of moderate degree 11/19/2013  . Small cell lung cancer 11/18/2013  . HTN (hypertension) 11/18/2013  . Steroid-induced hyperglycemia 11/18/2013  . Fall with head trauma 11/18/2013  . Underweight 11/18/2013  . Protein-calorie malnutrition, severe 11/18/2013  . 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 brain metastases 11/15/2013  . Acute respiratory failure 11/12/2013  . Altered mental status 11/12/2013  . Syncope secondary to brain metastasis 11/11/2013    Expected Discharge Date: Expected Discharge Date: 01/01/14  Team Members Present: Physician leading conference: Dr. Alger Simons Social Worker Present: Lennart Pall, LCSW Nurse Present: Elliot Cousin, RN PT Present: Melene Plan, Cottie Banda, PT OT Present: Roanna Epley, Griffin Basil, OT SLP Present: Gunnar Fusi, SLP PPS Coordinator present : Daiva Nakayama, RN, CRRN;Becky Alwyn Ren, PT     Current Status/Progress Goal Weekly Team Focus  Medical   still impaired with language and cognition, visual deficits. xrt complete. stamina issue  improve attention, activity tolerance  see prior and above. nutrition good   Bowel/Bladder   incontinent bladder 10% of time  . timed toileting every 3 hours . continent LBM 4-26    managed mod assist   educate family of timed toileting    Swallow/Nutrition/ Hydration   Dys. 3 textures with nectar-thick liquids, Mod A for use of strategies  Supervision for use of safe swallow strategies  increase utilization of swallowing compensatory strategies   ADL's   LB dressing-max A; UB dressing-min A; bathing-mod A/min A; transfers-mod A/max A; standing balance-mod A/max A; decreased activity tolerance; decreased attention  min A/supervision overall  activity tolerance, attention to task, transfers, sit<>stand, standing balance, family education   Mobility   Max A-Ax2 persons for ambulation and stair negotiation; Mod-max A for transfers; mod A for w/c propulsion and bed mobility; Max cueing for sustained attention   Supervision to Min A  functional endurance, attention to task, transfers, standing balance, ambulation, w/c propulsion, family education   Communication   Mod A  Min A to express wants/needs  increase awareness of verbosity    Safety/Cognition/ Behavioral Observations  Max-Total A  Mod A  atention, orientation, problem solving   Pain   n/a         Skin   right posterior scalp sutures intact . sacrum red barrier cvream applied scattered bruises to extremities . right chest porta cath   no breakdown   educate family of maintaining skin care     Rehab Goals Patient on target to meet rehab goals: Yes *See Care Plan and progress notes for long and short-term goals.  Barriers to Discharge: see prior, progression of cancer    Possible Resolutions to Barriers:  see prior, improve stamina    Discharge Planning/Teaching Needs:  home with husband and other family  to provide 24/7 assistance      Team Discussion:  Limited progress. Still max sit-stand. Hoping to reach moderate assist level with focus on family education.  Slightly improved attention and initiation.  Increasing ritalin today.  Starting water protocol.   Revisions to Treatment Plan:  Increase therapy  to 3 hrs per day now that rad tx finished.   Continued Need for Acute Rehabilitation Level of Care: The patient requires daily medical management by a physician with specialized training in physical medicine and rehabilitation for the following conditions: Daily direction of a multidisciplinary physical rehabilitation program to ensure safe treatment while eliciting the highest outcome that is of practical value to the patient.: Yes Daily medical management of patient stability for increased activity during participation in an intensive rehabilitation regime.: Yes Daily analysis of laboratory values and/or radiology reports with any subsequent need for medication adjustment of medical intervention for : Post surgical problems;Neurological problems  Lennart Pall 12/18/2013, 10:18 AM

## 2013-12-18 NOTE — Progress Notes (Signed)
Physical Therapy Session Note  Patient Details  Name: Katie [name] MRN: [id number] Date of Birth: [birth year]  Today's Date: 12/18/2013 Time: 1100-1150 Time Calculation (min): 50 min  Short Term Goals: Week 2:  PT Short Term Goal 1 (Week 2): Pt to perform bed mobility with min Ax1person PT Short Term Goal 2 (Week 2): Pt to perform bed<>w/c tranfsers w/ mod Ax1person 75% of time PT Short Term Goal 3 (Week 2): Pt will ambulate 100' w/ mod A x1person 50% of time PT Short Term Goal 4 (Week 2): Pt will negotiate up/down 2 steps w/ max A x1person PT Short Term Goal 5 (Week 2): Pt will propel w/c x100' w/ B LE and min Ax1person 50% of time  Skilled Therapeutic Interventions/Progress Updates:  1:1. Pt received sitting in w/c, ready for therapy. Focus this session on sustained attention, initiation, motor planning and coordination. Pt req max multimodal cues throughout session to complete functional tasks, with increased success when giving global cues such as "come to this chair" vs. "scoot forward, stand up, slide over here."  Pt initially req max A x1person for transfers car<>w/c<>bed/mat, however, with repetition able to decrease A to mod overall. Ambulation and blocked t/f sit<>stand/minisquats completed w/ min-max A x2persons using 3 musketeer technique w/ manual facilitation for hip/knee extension. Able to progressively decrease tactile cueing/facilitation over course of session. Pt demonstrating progressive difficulty maintaining EO, missed 77min at end of session due to fatigue. Pt sitting in w/c at end of session w/ all needs in reach, quick release belt in place and husband in room.   Therapy Documentation Precautions:  Precautions Precautions: Fall Precaution Comments: impulsive, decreased safety awareness Restrictions Weight Bearing Restrictions: No General: Amount of Missed PT Time (min): 10 Minutes Missed Time Reason: Patient fatigue  See FIM for current functional  status  Therapy/Group: Individual Therapy  Gilmore Laroche 12/18/2013, 12:48 PM

## 2013-12-18 NOTE — Progress Notes (Signed)
[facility] PHYSICAL MEDICINE & REHABILITATION     PROGRESS NOTE    Subjective/Complaints: Slept well. Perhaps more attentive yesterday. Denies pain Ros limited due to cognition/language.  Objective: Vital Signs: Blood pressure 102/74, pulse 94, temperature 97.8 F (36.6 C), temperature source Oral, resp. rate 18, height 5\' 7"  (1.702 m), weight 57.425 kg (126 lb 9.6 oz), SpO2 98.00%. No results found.  Recent Labs  12/18/13 0445  WBC 8.9  HGB 11.4*  HCT 33.6*  PLT 198    Recent Labs  12/18/13 0445  NA 134*  K 4.6  CL 95*  GLUCOSE 113*  BUN 25*  CREATININE 0.47*  CALCIUM 8.5   CBG (last 3)   Recent Labs  12/18/13 0005 12/18/13 0358 12/18/13 0722  GLUCAP 142* 116* 129*    Wt Readings from Last 3 Encounters:  12/09/13 57.425 kg (126 lb 9.6 oz)  12/03/13 54.4 kg (119 lb 14.9 oz)  12/03/13 54.4 kg (119 lb 14.9 oz)    Physical Exam:  Constitutional: No distress.  Frail appearing, alert  HENT:  Head: Normocephalic and atraumatic.  Right Ear: External ear normal.  Left Ear: External ear normal.  Eyes: Conjunctivae and EOM are normal. Pupils are equal, round, and reactive to light.  Neck: No JVD present. No tracheal deviation present. No thyromegaly present.  Cardiovascular: Normal rate and regular rhythm. Exam reveals friction rub. Exam reveals no gallop.  No murmur heard.  Respiratory: No respiratory distress. She has no wheezes. She has no rales. She exhibits no tenderness.  GI: She exhibits no distension. There is no tenderness. There is no rebound.  Lymphadenopathy:  She has no cervical adenopathy.  Neurological:  Attention waxes and wanes--perhaps more focused with me today. Still confused.    Language of confusion persistent. Typically keeps eyes closed unless cued. moves all 4 limbs. Senses pain.  left HH and likely ?diplopia  Skin: Skin is warm. Op site intact with some fluid still under skin---no real change Psychiatric:    confused   Assessment/Plan: 1. 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 Physiatrist is providing close team supervision and 24 hour management of active medical problems listed below. Physiatrist and rehab team continue to assess barriers to discharge/monitor patient progress toward functional and medical goals.  3 hrs of therapy per day---pace/spread out sessions.    FIM: FIM - Bathing Bathing Steps Patient Completed: Chest;Right Arm;Left Arm;Abdomen;Front perineal area;Right upper leg;Left upper leg;Right lower leg (including foot);Left lower leg (including foot) Bathing: 4: Min-Patient completes 8-9 70f 10 parts or 75+ percent  FIM - Upper Body Dressing/Undressing Upper body dressing/undressing steps patient completed: Thread/unthread right sleeve of pullover shirt/dresss;Thread/unthread left sleeve of pullover shirt/dress;Put head through opening of pull over shirt/dress;Pull shirt over trunk Upper body dressing/undressing: 5: Supervision: Safety issues/verbal cues FIM - Lower Body Dressing/Undressing Lower body dressing/undressing steps patient completed: Don/Doff left sock;Don/Doff right sock;Thread/unthread left pants leg Lower body dressing/undressing: 2: Max-Patient completed 25-49% of tasks  FIM - Toileting Toileting steps completed by patient: Performs perineal hygiene Toileting Assistive Devices: Grab bar or rail for support Toileting: 1: Total-Patient completed zero steps, helper did all 3  FIM - Radio producer Devices: Bedside commode Toilet Transfers: 2-To toilet/BSC: Max A (lift and lower assist);2-From toilet/BSC: Max A (lift and lower assist)  FIM - Engineer, site Assistive Devices: Arm rests Bed/Chair Transfer: 4: Supine > Sit: Min  A (steadying Pt. > 75%/lift 1 leg);3: Sit > Supine: Mod A  (lifting assist/Pt. 50-74%/lift 2 legs);2: Chair or W/C > Bed: Max A (lift and lower assist);2: Bed > Chair or W/C: Max A (lift and lower assist)  FIM - Locomotion: Wheelchair Distance: 100 Locomotion: Wheelchair: 3: Travels 150 ft or more: maneuvers on rugs and over door sills with moderate assistance  (Pt: 50 - 74%) FIM - Locomotion: Ambulation Locomotion: Ambulation Assistive Devices: Other (comment) (L UE over therapists shoulder) Ambulation/Gait Assistance: 2: Max assist;1: +2 Total assist Locomotion: Ambulation: 1: Two helpers (max A x1person, second person for w/c follow)  Comprehension Comprehension Mode: Auditory Comprehension: 2-Understands basic 25 - 49% of the time/requires cueing 51 - 75% of the time  Expression Expression Mode: Verbal Expression: 2-Expresses basic 25 - 49% of the time/requires cueing 50 - 75% of the time. Uses single words/gestures.  Social Interaction Social Interaction: 2-Interacts appropriately 25 - 49% of time - Needs frequent redirection.  Problem Solving Problem Solving: 2-Solves basic 25 - 49% of the time - needs direction more than half the time to initiate, plan or complete simple activities  Memory Memory: 1-Recognizes or recalls less than 25% of the time/requires cueing greater than 75% of the time   Medical Problem List and Plan:  Right temporal occipital metastatic tumor (SCLC)--  -XRT completed  -steroid taper--decadron -CTX as outpt  1. DVT Prophylaxis/Anticoagulation: Pharmaceutical: Lovenox  2. Pain Management: tylenol, hydrocodone  3. Mood: Resumed Lexapro.   4. Neuropsych: This patient is not capable of making decisions on her own behalf.   -increased ritalin to see if we see any benefit with attention or focus. 5. FEN/malnutrition---D3 Nectars--- f/u swallow study recommended that she continue on this  -eating 100% at present  -continue to push fluids   -water protocol---lytes improving 6. Sz prophylaxis---keppra  LOS  (Days) 12 A FACE TO FACE EVALUATION WAS PERFORMED  Meredith Staggers 12/18/2013 7:45 AM

## 2013-12-18 NOTE — Progress Notes (Signed)
PMR Admission Coordinator Pre-Admission Assessment  Patient: Katie [name] is an 63 y.o., female  MRN: [id number]  DOB: Jan 16, 1951  Height: _0  (167.6 cm)  Weight: 54.4 kg (119 lb 14.9 oz)  Insurance Information  HMO: PPO: Yes PCP: IPA: 80/20: OTHER: Group # [id number]  PRIMARY: [place] Policy#: [id number] Subscriber: [place] Name: Delight Stare Phone#: [phone] Fax#: [phone]  Pre-Cert#: [id number] X 2 weeks with update due on 12/18/13 after conference Employer: Not employed  Benefits: Phone #: [phone] Name: Ciara  Eff. Date: 08/22/13 Deduct: $3000(met all) Out of Pocket Max: $6600(met all) Life Max: unlimited  CIR: 70% /auth SNF: 70% w/auth  Outpatient: 70% With 30 visit max Co-Pay: 30%  Home Health: 70% w/auth and 30 visit max Co-Pay: 30%  DME: 70% Co-Pay: 30%  Providers: in network  Emergency [place]    Name  Relation  Home  Work  Mobile    Jaskolski,John  Spouse  [phone]   [phone]    Brown,Beverly  Daughter  [phone]   [phone]      Current Medical History  Patient Admitting Diagnosis: Right temporal occipital metastatic tumor (SCLC)  History of Present Illness: A 63 y.o. female with history of anxiety disorder otherwise in good health. She was admitted via [facility] on 11/11/13 past syncopal event and sustained a fall with injury to left frontal region and obtunded at admission. CT brain done demonstrates at least 2 large right-sided lesions one frontally and one parietal occipital area with 2 mm of shift with subfalcine herniation. Husband reported 6 week history of confusion with disorientation and falls. MRI brain with 4.4 x 6 cm right temporal parietal occipital mass with extensive vasogenic edema and high right frontal lobe mass 2.3 X 2.3 cm likely metastatic disease or possibly primary brain tumors as well as 10 mm Right to left shift with hydrocephalus. Patient taken to OR for Right parietal occipital crani for  resection of tumor on 11/12/13 by Dr. Ellene Route. 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 CT lungs with LLL mass with subcarinal adenopathy. She was extubated without difficulty on 11/15/13. She was transferred to [facility] for Brain XRT as recommended by Dr. Isidore Moos. She was evaluated by Dr. Marin Olp and chemo recommended for likely micrometastatic disease. She developed CSF leak requiring revision of incision with subgleal drain on 11/22/13. Mentation has fluctuated and she was placed on Vancomycin for HCAP. Drain removed but patient with continued subgaleal fluid collections therefore LP done with resolution. She was transferred back to [facility] and and whole brain XRT to be initiated today. Scheduled to have 14 radiation treatments. Patient has had improvement in mentation but is easily distracted, verbose, impulsive with deficits in problem solving, judgement, awareness and memory. She is oriented to self only. Patient with poor posture, question diplopia as well as ataxic gait with poor motor control. MBS done with evidence of aspiration of thin liquids and patient on Dysphagia 3, nectar liquids. Therapy team recommending CIR for further therapies. Portacath placed 12/05/13 with plans for chemo after radiation therapy completed and patient stronger.  Past Medical History  Past Medical History   Diagnosis  Date   .  Anxiety      takes lexapro    Family History  family history is not on file.  Prior Rehab/Hospitalizations: None  Current Medications  Current facility-administered medications:0.9 % sodium chloride infusion, , Intravenous, Continuous, Juanito Doom, MD, Last Rate: 10 mL/hr at 11/30/13  6767; acetaminophen (TYLENOL) solution 650 mg, 650 mg, Per Tube, Q6H PRN, Colbert Coyer, MD, 650 mg at 12/01/13 2148; antiseptic oral rinse (BIOTENE) solution 15 mL, 15 mL, Mouth Rinse, QID, Rush Farmer, MD, 15 mL at 12/05/13 1312  atorvastatin (LIPITOR) tablet 10  mg, 10 mg, Oral, QPM, Kristeen Miss, MD, 10 mg at 12/04/13 1630; cyanocobalamin tablet 500 mcg, 500 mcg, Oral, Daily, Kristeen Miss, MD, 500 mcg at 12/05/13 1102; dexamethasone (DECADRON) tablet 6 mg, 6 mg, Oral, 3 times per day, Volanda Napoleon, MD, 6 mg at 12/05/13 1346; enoxaparin (LOVENOX) injection 40 mg, 40 mg, Subcutaneous, Q24H, Volanda Napoleon, MD  escitalopram (LEXAPRO) tablet 10 mg, 10 mg, Oral, Daily, Kristeen Miss, MD, 10 mg at 12/05/13 1102; feeding supplement (ENSURE) (ENSURE) pudding 1 Container, 1 Container, Oral, TID BM, Kristeen Miss, MD, 1 Container at 12/04/13 2100; fluconazole (DIFLUCAN) tablet 100 mg, 100 mg, Oral, Daily, Volanda Napoleon, MD, 100 mg at 12/05/13 1102; HYDROcodone-acetaminophen (NORCO/VICODIN) 5-325 MG per tablet 1 tablet, 1 tablet, Oral, Q6H PRN, Kristeen Miss, MD  insulin aspart (novoLOG) injection 0-15 Units, 0-15 Units, Subcutaneous, 6 times per day, Colbert Coyer, MD, 2 Units at 12/05/13 [phone]; ipratropium-albuterol (DUONEB) 0.5-2.5 (3) MG/3ML nebulizer solution 3 mL, 3 mL, Nebulization, Q4H PRN, Venetia Maxon Rama, MD; levETIRAcetam (KEPPRA) tablet 500 mg, 500 mg, Oral, BID, Charlie Pitter, MD, 500 mg at 12/05/13 1102  loperamide (IMODIUM) capsule 2 mg, 2 mg, Oral, PRN, Kristeen Miss, MD, 2 mg at 11/30/13 1525; multivitamin with minerals tablet 1 tablet, 1 tablet, Oral, Daily, Volanda Napoleon, MD, 1 tablet at 12/05/13 1102; ondansetron (ZOFRAN) injection 4 mg, 4 mg, Intravenous, Q4H PRN, Kristeen Miss, MD; ondansetron (ZOFRAN) tablet 4 mg, 4 mg, Oral, Q4H PRN, Kristeen Miss, MD  pantoprazole (PROTONIX) EC tablet 40 mg, 40 mg, Oral, Daily, Volanda Napoleon, MD, 40 mg at 12/05/13 1102; promethazine (PHENERGAN) tablet 12.5-25 mg, 12.5-25 mg, Oral, Q4H PRN, Kristeen Miss, MD; [place], , Oral, PRN, Kristeen Miss, MD  Patients Current Diet: Dysphagia 3 diet with nectar thick liquids  Precautions / Restrictions  Precautions  Precautions: Fall  Precaution  Comments: impulsive, poor safety cognition  Restrictions  Weight Bearing Restrictions: No  Prior Activity Level  Limited Community (1-2x/wk): Went out 2-3 X a week  Development worker, international aid / [place] Devices/Equipment: Eyeglasses  Home Equipment: Environmental consultant - 2 wheels;Wheelchair - manual  Prior Functional Level  Prior Function  Level of Independence: Independent  Current Functional Level  Cognition  Arousal/Alertness: Awake/alert  Overall Cognitive Status: Impaired/Different from baseline  Current Attention Level: Focused (internally distracted)  Orientation Level: Oriented to person  Following Commands: Follows one step commands inconsistently  Safety/Judgement: Decreased awareness of safety;Decreased awareness of deficits  General Comments: pt able to read calendar on wall for orientation to time. Pt remains impulsive  Attention: Focused  Focused Attention: Impaired  Focused Attention Impairment: Verbal basic;Functional basic  Memory: Impaired  Memory Impairment: Storage deficit;Decreased recall of new information;Decreased short term memory  Decreased Short Term Memory: Verbal basic;Functional basic  Awareness: Impaired  Awareness Impairment: Intellectual impairment;Emergent impairment;Anticipatory impairment  Problem Solving: Impaired  Problem Solving Impairment: Verbal basic;Functional basic  Behaviors: Impulsive;Perseveration  Safety/Judgment: Impaired   Extremity Assessment  (includes Sensation/Coordination)     ADLs  Overall ADL's : Needs assistance/impaired  Eating/Feeding: Moderate assistance;Cueing for sequencing;Cueing for safety;Sitting  Eating/Feeding Details (indicate cue type and reason): Pt requires hand over hand at times to self feed due  to impulsiveness and perseveration. physical cues for bite size and speed. Demonstrated to husband technique for safe feeding. Pt verbalized understanding. discussed need to minimize any distractions during meals due  to poor attention and distractability.  Grooming: Oral care;Brushing hair;Sitting (see general adl comments )  Grooming Details (indicate cue type and reason): Pt had difficult time recognizing toothpast. Mod cues for proper sequencing. Used biotene to rinse with sponge and spit in cup. Pt required hand over hand for impulse control . Educated husbna don how to work with pt's deficits in order to increase her independce with self care.  Upper Body Bathing: Maximal assistance;Sitting  Lower Body Bathing: Total assistance;+2 for physical assistance;+2 for safety/equipment;Sit to/from stand  Upper Body Dressing : Maximal assistance  Lower Body Dressing: Total assistance;Sit to/from stand  Toilet Transfer: Maximal assistance;Stand-pivot;Cueing for safety  Toileting- Clothing Manipulation and Hygiene: Total assistance  Functional mobility during ADLs: Maximal assistance  General ADL Comments: swabbed mouth for oral care: pt on thickened diet. Pt was able to spit out and used squeezed sponge to rinse. Pt was able to comb hair with guarding of back of head for protection. Applied lotion with mod A. worked intermittently on sitting up without support. Pt able to hold this 1-2 minutes then got fatiqued. slightly twisted body last attempt. Pt was able to pull socks up by crossing legs with extra time to follow commands. Pt had just gotten up to chair prior to OT. Did not have toileting needs. Pt was able to scoot herself forward in chair   Mobility  Overal bed mobility: Needs Assistance  Bed Mobility: Supine to Sit  Supine to sit: Min assist;Mod assist  General bed mobility comments: impulsive. Poor gross motor control. Increased assist to complete scooting to EOB. Close supervision when sitting EOB. HIGH FALL RISK   Transfers  Overall transfer level: Needs assistance  Equipment used: 2 person hand held assist  Transfers: Sit to/from [facility]  Sit to Stand: +2 physical assistance;+2  safety/equipment;Max assist;Mod assist  Stand pivot transfers: +2 physical assistance;Mod assist;Max assist  General transfer comment: impulsive. MAX VC's on hand placement with sit to stand and stand to sit. Impaired gross motor control. Assisted off bed and on/off commode.   Ambulation / Gait / Stairs / Wheelchair Mobility  Ambulation/Gait  Ambulation/Gait assistance: +2 safety/equipment;+2 physical assistance;Max assist  Ambulation Distance (Feet): 58 Feet  Assistive device: (EVA walker )  Gait Pattern/deviations: Step-to pattern;Step-through pattern;Ataxic;Narrow base of support  Gait velocity: decreased  General Gait Details: Used EVA walker and MAX posterior trunk support to prevent pt from sitting too soon and assist with weight shift to increase steppage. Spouse assisted by walking in front of pt to encourage increased amb distance.   Posture / Balance  Dynamic Sitting Balance  Sitting balance - Comments: pt impulsively repeatedly attempted to return to supine in bed   Special needs/care consideration  BiPAP/CPAP No  CPM No  Continuous Drip IV No  Dialysis No  Life Vest No  Oxygen No  Special Bed No  Trach Size No  Wound Vac (area) No  Skin Bruises easily  Bowel mgmt: Had BM 12/04/13  Bladder mgmt: Voiding with incontinence at times; using BSC  Diabetic mgmt No  Daily radiation at [facility] daily via [place]: Spouse/significant other  Lives With: Spouse  Available Help at Discharge: Family;Available 24 hours/day  Type of Home: House  Home Layout: Two level;Able to live  on main level with bedroom/bathroom  Alternate Level Stairs-Rails: Right  Alternate Level Stairs-Number of Steps: one flight  Home Access: Stairs to enter  Entrance Stairs-Rails: None  Entrance Stairs-Number of Steps: 3  Bathroom Shower/Tub: Administrator, Civil Service: Standard  Bathroom Accessibility: Yes  How Accessible: Accessible  via walker  Home Care Services: No  Additional Comments: pt's husband provided home living info and states he will be available 24/7 at home.  Discharge Living Setting  Plans for Discharge Living Setting: Patient's home;House;Lives with (comment) (Lives with husband)  Type of Home at Discharge: House  Discharge Home Layout: Two level;Able to live on main level with bedroom/bathroom  Alternate Level Stairs-Number of Steps: Flight  Discharge Home Access: Stairs to enter  Entrance Stairs-Number of Steps: 3  Does the patient have any problems obtaining your medications?: No  Social/Family/Support Systems  Patient Roles: Spouse;Parent (Has daughter and a niece)  Contact Information: Rucha Wissinger - spouse  Anticipated Caregiver: Husband  Anticipated Caregiver's Contact Information: Jenny Reichmann (h) [phone] (c) [phone]  Ability/Limitations of Caregiver: Husband is retired and can Buyer, retail Availability: 24/7  Discharge Plan Discussed with Primary Caregiver: Yes  Is Caregiver In Agreement with Plan?: Yes  Does Caregiver/Family have Issues with Lodging/Transportation while Pt is in Rehab?: No  Goals/Additional Needs  Patient/Family Goal for Rehab: PT S, OT S/min A, ST S/min A goals  Expected length of stay: 13-20 days  Cultural Considerations: None  Dietary Needs: Dys 3, nectar thick liquids  Equipment Needs: TBD  Special Service Needs: Radiation therapy for 14 treatments. Has completeted 3/14 as of 12/05/13  Pt/Family Agrees to Admission and willing to participate: Yes  Program Orientation Provided & Reviewed with Pt/Caregiver Including Roles & Responsibilities: Yes  Decrease burden of Care through IP rehab admission: N/A  Possible need for SNF placement upon discharge: Not anticipated  Patient Condition: This patient's medical and functional status has changed since the consult dated: 12/03/13 in which the Rehabilitation Physician determined and documented that the patient's condition  is appropriate for intensive rehabilitative care in an inpatient rehabilitation facility. See "History of Present Illness" (above) for medical update. Functional changes are: Currently requiring max assist +2 to ambulate 58 ft with EVA walker. Patient's medical and functional status update has been discussed with the Rehabilitation physician and patient remains appropriate for inpatient rehabilitation. Will admit to inpatient rehab today.  Preadmission Screen Completed By: Retta Diones, 12/05/2013 5:50 PM  ______________________________________________________________________  Discussed status with Dr. Naaman Plummer On 12/06/13 at [phone] and received telephone approval for admission today.  Admission Coordinator: Retta Diones, time 8657 Date 12/06/2013.  Cosigned by: Meredith Staggers, MD [12/06/2013 11:09 AM

## 2013-12-18 NOTE — Progress Notes (Signed)
Occupational Therapy Session Note  Patient Details  Name: Katie [name] MRN: [id number] Date of Birth: December 15, 1950  Today's Date: 12/18/2013  Session 1 Time: 0800-0855 Time Calculation (min): 55 min  Short Term Goals: Week 2:  OT Short Term Goal 1 (Week 2): Pt will complete toileting with max A OT Short Term Goal 2 (Week 2): Pt will complete lower body dressing with max A OT Short Term Goal 3 (Week 2): Pt will perform toilet transfer with min A OT Short Term Goal 4 (Week 2): Pt will perform shower transfer with mod A  Skilled Therapeutic Interventions/Progress Updates:   Pt engaged in BADL retraining including bathing at shower level and dressing with sit<>stand from w/c at sink.  Pt required mod verbal cues to initiate bathing tasks and for sequencing.  Pt required min verbal cues for attention to task while bathing.  Pt continues to require max/mod A for stand pivot transfer with max multimodal cues for BUE/BLE positioning in preparation for transfer. Pt was able to don both tshirt and pullover shirt without assistance this morning and was able to pull up pants while steady A provided for standing balance.  Pt required mod verbal cues for attention to task during dressing activities.  Focus on activity tolerance, sit<>stand, transfers, standing balance, task initiation, attention to task, and safety awareness.   Therapy Documentation Precautions:  Precautions Precautions: Fall Precaution Comments: impulsive, decreased safety awareness Restrictions Weight Bearing Restrictions: No   Pain: Pain Assessment Pain Assessment: No/denies pain  See FIM for current functional status  Therapy/Group: Individual Therapy  Session 2 Time: 1345-1430 Pt denies pain Individual Therapy  [name] niece present for therapy session.  Pt engaged in standing activities at hi lo table retrieving items from varying surface heights and placing them in a different location.  Pt required max demonstration  cues to enable pt to complete tasks.  Pt requested to retrieve items from all quadrants of visual field.  Pt exhibited inconsistent ability to complete tasks successfully - sometimes undershooting target, sometimes overshooting target, sometimes missing target on right or left in all visual quadrants.  Focus on dynamic standing balance, following one step commands, task initiation, attention to task, activity tolerance, and safety awareness.  Pt required max A for sit<>stand but maintained standing balance with steady A/min A.  Anselmo Rod Floria Brandau 12/18/2013, 9:03 AM

## 2013-12-18 NOTE — Progress Notes (Signed)
Speech Language Pathology Daily Session Note  Patient Details  Name: Katie [name] MRN: [id number] Date of Birth: 1951-05-12  Today's Date: 12/18/2013 Time: 0900-0940 Time Calculation (min): 40 min  Short Term Goals: Week 2: SLP Short Term Goal 1 (Week 2): Patient will consume PO with Min level verbal cues for pacing and portion control. SLP Short Term Goal 2 (Week 2): Patient will sustain attention to task for 1 minute with Max verbal cues for redirection. SLP Short Term Goal 3 (Week 2): Patient will utilize external aids for orientation with Max assist.  SLP Short Term Goal 4 (Week 2): Patient will label 2 deficits with Max clinician cues.  SLP Short Term Goal 5 (Week 2): Patient will solve basic problems with Max assist. SLP Short Term Goal 6 (Week 2): Patient maintain topic of conversation for 2 turns with Mod clinician cues.  Skilled Therapeutic Interventions: Skilled treatment session focused on dysphagia and cognitive goals. SLP facilitated session by providing Mod verbal and visual cues for functional problem solving during oral care task. Pt consumed trials of water via cup with cough X 1, suspect due to large sips and required Min verbal cues to utilize a slow pace and small sips throughout the session.  Pt also required Mod A verbal and visual cues to sort coins from a field of 4 and Max-total A to count change. Suspect function was impacted by pt's impulsivity and decreased vision. Continue with current plan of care.   FIM:  Comprehension Comprehension Mode: Auditory Comprehension: 3-Understands basic 50 - 74% of the time/requires cueing 25 - 50%  of the time Expression Expression: 3-Expresses basic 50 - 74% of the time/requires cueing 25 - 50% of the time. Needs to repeat parts of sentences. Social Interaction Social Interaction: 3-Interacts appropriately 50 - 74% of the time - May be physically or verbally inappropriate. Problem Solving Problem Solving: 2-Solves basic 25 -  49% of the time - needs direction more than half the time to initiate, plan or complete simple activities Memory Memory: 1-Recognizes or recalls less than 25% of the time/requires cueing greater than 75% of the time FIM - Eating Eating Activity: 5: Supervision/cues  Pain Pain Assessment Pain Assessment: No/denies pain  Therapy/Group: Individual Therapy  Buzzy Han 12/18/2013, 12:57 PM

## 2013-12-19 ENCOUNTER — Inpatient Hospital Stay (HOSPITAL_COMMUNITY): Payer: BC Managed Care – PPO

## 2013-12-19 ENCOUNTER — Inpatient Hospital Stay (HOSPITAL_COMMUNITY): Payer: BC Managed Care – PPO | Admitting: *Deleted

## 2013-12-19 ENCOUNTER — Encounter (HOSPITAL_COMMUNITY): Payer: BC Managed Care – PPO

## 2013-12-19 ENCOUNTER — Inpatient Hospital Stay (HOSPITAL_COMMUNITY): Payer: BC Managed Care – PPO | Admitting: Speech Pathology

## 2013-12-19 LAB — GLUCOSE, CAPILLARY
GLUCOSE-CAPILLARY: 114 mg/dL — AB (ref 70–99)
GLUCOSE-CAPILLARY: 124 mg/dL — AB (ref 70–99)
GLUCOSE-CAPILLARY: 167 mg/dL — AB (ref 70–99)
Glucose-Capillary: 116 mg/dL — ABNORMAL HIGH (ref 70–99)
Glucose-Capillary: 119 mg/dL — ABNORMAL HIGH (ref 70–99)
Glucose-Capillary: 150 mg/dL — ABNORMAL HIGH (ref 70–99)

## 2013-12-19 NOTE — Progress Notes (Signed)
Recreational Therapy Session Note  Patient Details  Name: Katie [name] MRN: [id number] Date of Birth: [birth year] Today's Date: 12/19/2013 Late Entry for 12/16/13  Pain: no c/o Skilled Therapeutic Interventions/Progress Updates: Session focused on activity tolerance, ambulation, sustained attention, visual scanning during horticultural therapy activity.  Pt required mod-max multimodal cues for redirection to therapeutic tasks due to verbal perseveration and distraction from family. Pt amb 175' room>day room w/ +2 assist (L UE over therapists shoulder and R hand holding w/ other therapist). Pt engaged in horticulture task w/ max A for standing balance, max cues for sequence and +2 assist for sidestepping x10'. Pt req max A for all sit<->stand transfers.  [name] 12/19/2013, 10:39 AM

## 2013-12-19 NOTE — Progress Notes (Signed)
Occupational Therapy Session Note  Patient Details  Name: Katie [name] MRN: [id number] Date of Birth: 1951/07/13  Today's Date: 12/19/2013  Session 1 Time: 0800-0855 Time Calculation (min): 55 min  Short Term Goals: Week 2:  OT Short Term Goal 1 (Week 2): Pt will complete toileting with max A OT Short Term Goal 2 (Week 2): Pt will complete lower body dressing with max A OT Short Term Goal 3 (Week 2): Pt will perform toilet transfer with min A OT Short Term Goal 4 (Week 2): Pt will perform shower transfer with mod A  Skilled Therapeutic Interventions/Progress Updates:    Pt engaged in BADL retraining including bathing at shower level and dressing with sit<>stand from w/c at sink.  [name] sister present for therapy session.  Pt completed bathing tasks with mod verbal cues this morning and exhibited sustained attention for approx 2 mins during bathing tasks. Pt required mod verbal cues for task initiation during dressing tasks.  Pt required max A for sit<>stand at sink to pull up pants. Pt required steady A while standing but was able to pull up pants. [name] sister provided appropriate verbal cues throughout session.  Focus on activity tolerance, safety awareness, task initiation, attention to task, sequencing, sit<>stand, and standing balance.   Therapy Documentation Precautions:  Precautions Precautions: Fall Precaution Comments: impulsive, decreased safety awareness Restrictions Weight Bearing Restrictions: No Pain: Pain Assessment Pain Assessment: No/denies pain  See FIM for current functional status  Therapy/Group: Individual Therapy  Session 2 Time: 0488-8916 Pt denies pain Individual Therapy  Pt engaged in sit<>stand and standing balance activities.  Pt required max verbal cues for BUE/BLE positioning and for technique when performing sit<>stand.  Pt required mod verbal cues to attend to task.  Pt required mod A/max A for sit<>stand and min a for standing balance.  [name]  sister present to observe and provide appropriate verbal cues throughout session. Discussed session treatment plan to improve sit<>stand and reduce assistance required to decrease burden of care.  [name] sister verbalized understanding. Focus on sit<>stand, standing balance, task initiation, attention to task, activity tolerance, and safety awareness.  Anselmo Rod [employer] 12/19/2013, 9:03 AM

## 2013-12-19 NOTE — Progress Notes (Signed)
[facility] PHYSICAL MEDICINE & REHABILITATION     PROGRESS NOTE    Subjective/Complaints: No new issues.  Ros limited due to cognition/language.  Objective: Vital Signs: Blood pressure 116/69, pulse 78, temperature 97.7 F (36.5 C), temperature source Oral, resp. rate 17, height 5\' 7"  (1.702 m), weight 59.2 kg (130 lb 8.2 oz), SpO2 94.00%. No results found.  Recent Labs  12/18/13 0445  WBC 8.9  HGB 11.4*  HCT 33.6*  PLT 198    Recent Labs  12/18/13 0445  NA 134*  K 4.6  CL 95*  GLUCOSE 113*  BUN 25*  CREATININE 0.47*  CALCIUM 8.5   CBG (last 3)   Recent Labs  12/19/13 12/19/13 0403 12/19/13 0714  GLUCAP 114* 116* 124*    Wt Readings from Last 3 Encounters:  12/18/13 59.2 kg (130 lb 8.2 oz)  12/03/13 54.4 kg (119 lb 14.9 oz)  12/03/13 54.4 kg (119 lb 14.9 oz)    Physical Exam:  Constitutional: No distress.  Frail appearing, alert  HENT:  Head: Normocephalic and atraumatic.  Right Ear: External ear normal.  Left Ear: External ear normal.  Eyes: Conjunctivae and EOM are normal. Pupils are equal, round, and reactive to light.  Neck: No JVD present. No tracheal deviation present. No thyromegaly present.  Cardiovascular: Normal rate and regular rhythm. Exam reveals friction rub. Exam reveals no gallop.  No murmur heard.  Respiratory: No respiratory distress. She has no wheezes. She has no rales. She exhibits no tenderness.  GI: She exhibits no distension. There is no tenderness. There is no rebound.  Lymphadenopathy:  She has no cervical adenopathy.  Neurological:  Attention waxes and wanes--perhaps more focused with me today. Still confused.    Language of confusion persistent. Typically keeps eyes closed unless cued. moves all 4 limbs. Senses pain.  left HH and likely ?diplopia  Skin: Skin is warm. Op site intact with some fluid still under skin---no real change Psychiatric:   confused   Assessment/Plan: 1. 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 Physiatrist is providing close team supervision and 24 hour management of active medical problems listed below. Physiatrist and rehab team continue to assess barriers to discharge/monitor patient progress toward functional and medical goals.  3 hrs of therapy per day---pace/spread out sessions.    FIM: FIM - Bathing Bathing Steps Patient Completed: Chest;Right Arm;Left Arm;Abdomen;Front perineal area;Left lower leg (including foot);Right lower leg (including foot);Left upper leg;Right upper leg Bathing: 4: Min-Patient completes 8-9 72f 10 parts or 75+ percent  FIM - Upper Body Dressing/Undressing Upper body dressing/undressing steps patient completed: Thread/unthread right sleeve of pullover shirt/dresss;Thread/unthread left sleeve of pullover shirt/dress;Put head through opening of pull over shirt/dress;Pull shirt over trunk Upper body dressing/undressing: 5: Supervision: Safety issues/verbal cues FIM - Lower Body Dressing/Undressing Lower body dressing/undressing steps patient completed: Thread/unthread left pants leg;Pull pants up/down;Don/Doff left sock;Don/Doff right sock Lower body dressing/undressing: 2: Max-Patient completed 25-49% of tasks  FIM - Toileting Toileting steps completed by patient: Performs perineal hygiene Toileting Assistive Devices: Grab bar or rail for support Toileting: 1: Total-Patient completed zero steps, helper did all 3  FIM - Radio producer Devices: Bedside commode Toilet Transfers: 2-To toilet/BSC: Max A (lift and lower assist);2-From toilet/BSC: Max A (lift and lower assist)  FIM - Engineer, site Assistive Devices: Arm rests Bed/Chair Transfer: 3: Chair or W/C > Bed: Mod A (  lift or lower assist);3: Bed > Chair or W/C: Mod A (lift or lower assist)  FIM - Locomotion:  Wheelchair Distance: 100 Locomotion: Wheelchair: 1: Total Assistance/staff pushes wheelchair (Pt<25%) FIM - Locomotion: Ambulation Locomotion: Ambulation Assistive Devices: Other (comment) (3 musketeers) Ambulation/Gait Assistance: 1: +2 Total assist Locomotion: Ambulation: 1: Two helpers (3rd person, husband following in w/c. )  Comprehension Comprehension Mode: Auditory Comprehension: 3-Understands basic 50 - 74% of the time/requires cueing 25 - 50%  of the time  Expression Expression Mode: Verbal Expression: 3-Expresses basic 50 - 74% of the time/requires cueing 25 - 50% of the time. Needs to repeat parts of sentences.  Social Interaction Social Interaction: 3-Interacts appropriately 50 - 74% of the time - May be physically or verbally inappropriate.  Problem Solving Problem Solving: 2-Solves basic 25 - 49% of the time - needs direction more than half the time to initiate, plan or complete simple activities  Memory Memory: 1-Recognizes or recalls less than 25% of the time/requires cueing greater than 75% of the time   Medical Problem List and Plan:  Right temporal occipital metastatic tumor (SCLC)--  -XRT completed  -steroid taper--decadron -CTX as outpt  1. DVT Prophylaxis/Anticoagulation: Pharmaceutical: Lovenox  2. Pain Management: tylenol, hydrocodone  3. Mood: Resumed Lexapro.   4. Neuropsych: This patient is not capable of making decisions on her own behalf.   -increased ritalin seems to have helped a bit 5. FEN/malnutrition---D3 Nectars---   -eating 100% at present  -continue to push fluids   -water protocol---lytes improving 6. Sz prophylaxis---keppra  LOS (Days) 13 A FACE TO FACE EVALUATION WAS PERFORMED  Meredith Staggers 12/19/2013 7:50 AM

## 2013-12-19 NOTE — Progress Notes (Signed)
Physical Therapy Note  Patient Details  Name: Katie [name] MRN: [id number] Date of Birth: 1951-03-30 Today's Date: 12/19/2013 1300-1400, 60 min +2 with Therapeutic Rec for skilled transfers and mobility R AFO  Tx focused on therapeutic activities in standing, neuro re-ed during gait with grocery cart, attention to task.   neuromuscular re-education via forced use, manual cues for +2 gait using grocery cart, to facilitate anterior pelvic tilt.  Gait x 180' with 1 standing rest break as pt started to scissor and L knee hyperextend more with fatigue.  Ambulation ended when pt knees suddenly buckled bil, +2 assist to prevent fall.  After activities, pt performed gait again with grocery cart, with decreased trunk extension and posterior pelvic tilt.  Therapeutic tasks in standing: sidestepping L >< R at kitchen counter with bil UE support: to access sink to wet cloth, and while wiping and drying counter to clean it.  Pt turned on water, wet cloth, wrung out cloth using bil hands, with rare VC to stay on task for 7 minutes.  Standing at rolling table to fold towels to facilitate forward lean, bil hand use without leaning on table, mod> min assist for balance, x 6 minutes.  Sit>< stand x 4 focusing on bringing hips forward (" walk your bottom out") and moving into anterior pelvic tilt and trunk flexion. W/c> mat squat pivot transfer with mod assist.    Pt benefits from brief, blunt commands due to distraction and processing problems.  GAEL DELUDE 12/19/2013, 3:32 PM

## 2013-12-19 NOTE — Progress Notes (Signed)
Speech Language Pathology Daily Session Note  Patient Details  Name: Katie [name] MRN: [id number] Date of Birth: 1950/08/29  Today's Date: 12/19/2013 Time: 1400-1445 Time Calculation (min): 45 min  Short Term Goals: Week 2: SLP Short Term Goal 1 (Week 2): Patient will consume PO with Min level verbal cues for pacing and portion control. SLP Short Term Goal 2 (Week 2): Patient will sustain attention to task for 1 minute with Max verbal cues for redirection. SLP Short Term Goal 3 (Week 2): Patient will utilize external aids for orientation with Max assist.  SLP Short Term Goal 4 (Week 2): Patient will label 2 deficits with Max clinician cues.  SLP Short Term Goal 5 (Week 2): Patient will solve basic problems with Max assist. SLP Short Term Goal 6 (Week 2): Patient maintain topic of conversation for 2 turns with Mod clinician cues.  Skilled Therapeutic Interventions:  Skilled treatment session focused on dysphagia and cognitive goals. SLP facilitated session by providing Mod verbal and visual cues for functional problem solving during oral care task. Pt consumed trials of water via cup with cough X 1, suspect due to large sips and required Mod-Max verbal and tactile cues to utilize a slow pace and small sips throughout the session.  Pt also required Max A verbal and visual cues for sustained attention to basic written expression task and for recall of basic biographical information to complete task. Pt demonstrated increased intellectual awareness of confusion/cognitive impairments. Continue with current plan of care.   FIM:  Comprehension Comprehension Mode: Auditory Comprehension: 3-Understands basic 50 - 74% of the time/requires cueing 25 - 50%  of the time Expression Expression Mode: Verbal Expression: 3-Expresses basic 50 - 74% of the time/requires cueing 25 - 50% of the time. Needs to repeat parts of sentences. Social Interaction Social Interaction: 3-Interacts appropriately 50 - 74%  of the time - May be physically or verbally inappropriate. Problem Solving Problem Solving: 2-Solves basic 25 - 49% of the time - needs direction more than half the time to initiate, plan or complete simple activities Memory Memory: 1-Recognizes or recalls less than 25% of the time/requires cueing greater than 75% of the time  Pain Pain Assessment Pain Assessment: No/denies pain  Therapy/Group: Individual Therapy  Buzzy Han 12/19/2013, 3:17 PM

## 2013-12-19 NOTE — Progress Notes (Signed)
Recreational Therapy Session Note  Patient Details  Name: Katie [name] MRN: [id number] Date of Birth: June 14, 1951 Today's Date: 12/19/2013  Pain: no c/o Skilled Therapeutic Interventions/Progress Updates: Session focused on activity tolerance, ambulation, dynamic standing balance, attention to task during co-treat with PT.  Pt stood at counter simulating kitchen cleaning activity, cleaning counter top with min-mod assist and then progressed to standing at rolling table to fold towels.  [name] 12/19/2013, 4:13 PM

## 2013-12-20 ENCOUNTER — Encounter (HOSPITAL_COMMUNITY): Payer: BC Managed Care – PPO

## 2013-12-20 ENCOUNTER — Inpatient Hospital Stay (HOSPITAL_COMMUNITY): Payer: BC Managed Care – PPO | Admitting: Speech Pathology

## 2013-12-20 ENCOUNTER — Inpatient Hospital Stay (HOSPITAL_COMMUNITY): Payer: BC Managed Care – PPO | Admitting: Physical Therapy

## 2013-12-20 ENCOUNTER — Inpatient Hospital Stay (HOSPITAL_COMMUNITY): Payer: BC Managed Care – PPO

## 2013-12-20 DIAGNOSIS — F341 Dysthymic disorder: Secondary | ICD-10-CM

## 2013-12-20 DIAGNOSIS — C7931 Secondary malignant neoplasm of brain: Secondary | ICD-10-CM

## 2013-12-20 DIAGNOSIS — R4182 Altered mental status, unspecified: Secondary | ICD-10-CM

## 2013-12-20 DIAGNOSIS — C7949 Secondary malignant neoplasm of other parts of nervous system: Secondary | ICD-10-CM

## 2013-12-20 DIAGNOSIS — I10 Essential (primary) hypertension: Secondary | ICD-10-CM

## 2013-12-20 LAB — GLUCOSE, CAPILLARY
GLUCOSE-CAPILLARY: 118 mg/dL — AB (ref 70–99)
GLUCOSE-CAPILLARY: 126 mg/dL — AB (ref 70–99)
GLUCOSE-CAPILLARY: 134 mg/dL — AB (ref 70–99)
GLUCOSE-CAPILLARY: 92 mg/dL (ref 70–99)
Glucose-Capillary: 121 mg/dL — ABNORMAL HIGH (ref 70–99)
Glucose-Capillary: 104 mg/dL — ABNORMAL HIGH (ref 70–99)
Glucose-Capillary: 133 mg/dL — ABNORMAL HIGH (ref 70–99)

## 2013-12-20 MED ORDER — DEXAMETHASONE 4 MG PO TABS
4.0000 mg | ORAL_TABLET | Freq: Two times a day (BID) | ORAL | Status: DC
  Administered 2013-12-20 – 2013-12-28 (×16): 4 mg via ORAL
  Filled 2013-12-20 (×18): qty 1

## 2013-12-20 NOTE — Progress Notes (Signed)
Occupational Therapy Session Note  Patient Details  Name: Katie [name] MRN: [id number] Date of Birth: 07/28/1951  Today's Date: 12/20/2013  Session 1 Time: 0800-0900 Time Calculation (min): 60 min  Short Term Goals: Week 2:  OT Short Term Goal 1 (Week 2): Pt will complete toileting with max A OT Short Term Goal 2 (Week 2): Pt will complete lower body dressing with max A OT Short Term Goal 3 (Week 2): Pt will perform toilet transfer with min A OT Short Term Goal 4 (Week 2): Pt will perform shower transfer with mod A  Skilled Therapeutic Interventions/Progress Updates:    Pt eating breakfast in bed with sister at bedside.  Pt engaged in bathing at shower level and dressing with sit<>stand from w/c at sink.  Pt required max multimodal cues for bed mobility to sit EOB in preparation for transfer to w/c.  Pt required max A for squat pivot transfer bed->w.c and stand pivot transfer w/c<>shower seat.  Pt required min verbal cues to initiate bathing tasks and for sequencing.  Pt was able to thread both pants this morning and pull up pants while standing at sink with min A for standing.  Pt required min verbal cues to open eyes to attend to tasks this morning.  [name] sister provided appropriate verbal cues for dressing and grooming tasks.  Pt exhibited increased fatigue as session progressed and required increased verbal cues to initiate and attend to tasks.  Focus on activity tolerance, task initiation, sequencing, attention to task, visual scanning to locate grooming items, transfers, sit<>stand, and safety awareness.  Therapy Documentation Precautions:  Precautions Precautions: Fall Precaution Comments: impulsive, decreased safety awareness Restrictions Weight Bearing Restrictions: No Pain: Pain Assessment Pain Assessment: No/denies pain  See FIM for current functional status  Therapy/Group: Individual Therapy  Session 2 Time: 1130-1200 Pt denies pain Individual Therapy  [name] sister  present to observe therapy. Focus on sit<>stand at hi-lo table to water plants to address standing balance, task initiation, attention to task, visual attention, and safety awareness.  Pt required max multimodal cues for sequencing in preparation for sit<>stand. Pt required max verbal cues to shift weight while standing.  Pt required max verbal cues keep eyes open. Pt required mod A/max A for sit<>stand and steady A for standing balance.   Anselmo Rod Evalette Montrose 12/20/2013, 9:05 AM

## 2013-12-20 NOTE — Progress Notes (Signed)
PHYSICAL MEDICINE & REHABILITATION     PROGRESS NOTE    Subjective/Complaints: Had a good day with therapy. Slept well. No complaints. Ros limited due to cognition/language.  Objective: Vital Signs: Blood pressure 129/74, pulse 75, temperature 97.6 F (36.4 C), temperature source Oral, resp. rate 18, height 5\' 7"  (1.702 m), weight 59.2 kg (130 lb 8.2 oz), SpO2 98.00%. No results found.  Recent Labs  12/18/13 0445  WBC 8.9  HGB 11.4*  HCT 33.6*  PLT 198    Recent Labs  12/18/13 0445  NA 134*  K 4.6  CL 95*  GLUCOSE 113*  BUN 25*  CREATININE 0.47*  CALCIUM 8.5   CBG (last 3)   Recent Labs  12/20/13 0006 12/20/13 0402 12/20/13 0751  GLUCAP 126* 121* 92    Wt Readings from Last 3 Encounters:  12/18/13 59.2 kg (130 lb 8.2 oz)  12/03/13 54.4 kg (119 lb 14.9 oz)  12/03/13 54.4 kg (119 lb 14.9 oz)    Physical Exam:  Constitutional: No distress.  Frail appearing, alert  HENT:  Head: Normocephalic and atraumatic.  Right Ear: External ear normal.  Left Ear: External ear normal.  Eyes: Conjunctivae and EOM are normal. Pupils are equal, round, and reactive to light.  Neck: No JVD present. No tracheal deviation present. No thyromegaly present.  Cardiovascular: Normal rate and regular rhythm. Exam reveals friction rub. Exam reveals no gallop.  No murmur heard.  Respiratory: No respiratory distress. She has no wheezes. She has no rales. She exhibits no tenderness.  GI: She exhibits no distension. There is no tenderness. There is no rebound.  Lymphadenopathy:  She has no cervical adenopathy.  Neurological:  Attention waxes and wanes stil.    Language of confusion persistent. Typically keeps eyes closed unless cued. moves all 4 limbs. Senses pain.  left HH and likely ?diplopia  Skin: Skin is warm. Op site intact with some fluid still under skin---no real change Psychiatric:   confused   Assessment/Plan: 1. Functional deficits secondary to metastatic  lung ca to the brain which require 3+ hours per day of interdisciplinary therapy in a comprehensive inpatient rehab setting. Physiatrist is providing close team supervision and 24 hour management of active medical problems listed below. Physiatrist and rehab team continue to assess barriers to discharge/monitor patient progress toward functional and medical goals.  3 hrs of therapy per day---pace/spread out sessions.    FIM: FIM - Bathing Bathing Steps Patient Completed: Chest;Right Arm;Left Arm;Abdomen;Front perineal area;Left lower leg (including foot);Right lower leg (including foot);Left upper leg;Right upper leg Bathing: 4: Min-Patient completes 8-9 34f 10 parts or 75+ percent  FIM - Upper Body Dressing/Undressing Upper body dressing/undressing steps patient completed: Thread/unthread right sleeve of pullover shirt/dresss;Thread/unthread left sleeve of pullover shirt/dress;Put head through opening of pull over shirt/dress;Pull shirt over trunk Upper body dressing/undressing: 5: Supervision: Safety issues/verbal cues FIM - Lower Body Dressing/Undressing Lower body dressing/undressing steps patient completed: Thread/unthread right pants leg;Pull pants up/down;Don/Doff right sock;Don/Doff left sock Lower body dressing/undressing: 2: Max-Patient completed 25-49% of tasks  FIM - Toileting Toileting steps completed by patient: Performs perineal hygiene Toileting Assistive Devices: Grab bar or rail for support Toileting: 1: Total-Patient completed zero steps, helper did all 3  FIM - Radio producer Devices: Bedside commode Toilet Transfers: 2-To toilet/BSC: Max A (lift and lower assist);2-From toilet/BSC: Max A (lift and lower assist)  FIM - Engineer, site Assistive Devices: Arm rests Bed/Chair Transfer: 3: Chair or W/C > Bed: Mod  A (lift or lower assist)  FIM - Locomotion: Wheelchair Distance: 100 Locomotion: Wheelchair: 1: Total  Assistance/staff pushes wheelchair (Pt<25%) FIM - Locomotion: Ambulation Locomotion: Ambulation Assistive Devices: Other (comment) (grocery cart) Ambulation/Gait Assistance: 1: +2 Total assist Locomotion: Ambulation: 1: Two helpers  Comprehension Comprehension Mode: Auditory Comprehension: 3-Understands basic 50 - 74% of the time/requires cueing 25 - 50%  of the time  Expression Expression Mode: Verbal Expression: 3-Expresses basic 50 - 74% of the time/requires cueing 25 - 50% of the time. Needs to repeat parts of sentences.  Social Interaction Social Interaction: 3-Interacts appropriately 50 - 74% of the time - May be physically or verbally inappropriate.  Problem Solving Problem Solving: 2-Solves basic 25 - 49% of the time - needs direction more than half the time to initiate, plan or complete simple activities  Memory Memory: 1-Recognizes or recalls less than 25% of the time/requires cueing greater than 75% of the time   Medical Problem List and Plan:  Right temporal occipital metastatic tumor (SCLC)--  -XRT completed  -steroid taper--decadron--decrease to 4mg  q12 -CTX as outpt  1. DVT Prophylaxis/Anticoagulation: Pharmaceutical: Lovenox  2. Pain Management: tylenol, hydrocodone  3. Mood: Resumed Lexapro.   4. Neuropsych: This patient is not capable of making decisions on her own behalf.   -attention has improved a bit with ritalin but still with profound impairments cognitively 5. FEN/malnutrition---D3 Nectars---   -eating 100% at present  -continue to push fluids   -water protocol---lytes improving 6. Sz prophylaxis---keppra  LOS (Days) 14 A FACE TO FACE EVALUATION WAS PERFORMED  Meredith Staggers 12/20/2013 8:15 AM

## 2013-12-20 NOTE — Progress Notes (Signed)
Speech Language Pathology Daily Session Note  Patient Details  Name: Katie [name] MRN: [id number] Date of Birth: Mar 30, 1951  Today's Date: 12/20/2013 Time: 0930-1015 Time Calculation (min): 45 min  Short Term Goals: Week 2: SLP Short Term Goal 1 (Week 2): Patient will consume PO with Min level verbal cues for pacing and portion control. SLP Short Term Goal 2 (Week 2): Patient will sustain attention to task for 1 minute with Max verbal cues for redirection. SLP Short Term Goal 3 (Week 2): Patient will utilize external aids for orientation with Max assist.  SLP Short Term Goal 4 (Week 2): Patient will label 2 deficits with Max clinician cues.  SLP Short Term Goal 5 (Week 2): Patient will solve basic problems with Max assist. SLP Short Term Goal 6 (Week 2): Patient maintain topic of conversation for 2 turns with Mod clinician cues.  Skilled Therapeutic Interventions: Skilled treatment session focused on cognitive-linguistic goals. SLP facilitated session by providing Max A multimodal cueing for functional problem solving and visual scanning during and total A for recall during calendar making task. Pt also required Mod verbal and tactile cues to sustain attention to task and keep eyes open for 45-60 seconds throughout the session. Continue with current plan of care.   FIM:  Comprehension Comprehension Mode: Auditory Comprehension: 3-Understands basic 50 - 74% of the time/requires cueing 25 - 50%  of the time Expression Expression: 3-Expresses basic 50 - 74% of the time/requires cueing 25 - 50% of the time. Needs to repeat parts of sentences. Social Interaction Social Interaction: 3-Interacts appropriately 50 - 74% of the time - May be physically or verbally inappropriate. Problem Solving Problem Solving: 2-Solves basic 25 - 49% of the time - needs direction more than half the time to initiate, plan or complete simple activities Memory Memory: 1-Recognizes or recalls less than 25% of the  time/requires cueing greater than 75% of the time  Pain Pain Assessment Pain Assessment: No/denies pain  Therapy/Group: Individual Therapy  Buzzy Han 12/20/2013, 2:07 PM

## 2013-12-20 NOTE — Progress Notes (Signed)
Physical Therapy Session Note  Patient Details  Name: Katie [name] MRN: [id number] Date of Birth: May 14, 1951  Today's Date: 12/20/2013 Time: 1884-1660 Time Calculation (min): 61 min  Short Term Goals: Week 1:  PT Short Term Goal 1 (Week 1): Pt will increase bed mobility to mod A.  PT Short Term Goal 1 - Progress (Week 1): Met PT Short Term Goal 2 (Week 1): Pt will increase transfer bed to chair, chair to bed to mod A.  PT Short Term Goal 2 - Progress (Week 1): Partly met PT Short Term Goal 3 (Week 1): Pt will increase ambulation with LRAD to mod A about 25 feet.  PT Short Term Goal 3 - Progress (Week 1): Partly met PT Short Term Goal 4 (Week 1): Pt will ascend/descend 2 stairs with B rails and max A.  PT Short Term Goal 4 - Progress (Week 1): Partly met PT Short Term Goal 5 (Week 1): Pt will propel w/c about 100 feet with B LEs and min A.  PT Short Term Goal 5 - Progress (Week 1): Partly met Week 2:  PT Short Term Goal 1 (Week 2): Pt to perform bed mobility with min Ax1person PT Short Term Goal 2 (Week 2): Pt to perform bed<>w/c tranfsers w/ mod Ax1person 75% of time PT Short Term Goal 3 (Week 2): Pt will ambulate 100' w/ mod A x1person 50% of time PT Short Term Goal 4 (Week 2): Pt will negotiate up/down 2 steps w/ max A x1person PT Short Term Goal 5 (Week 2): Pt will propel w/c x100' w/ B LE and min Ax1person 50% of time  Skilled Therapeutic Interventions/Progress Updates:   Pt resting in w/c with sister and niece present.  When cued pt unable to recall or report activities that she has been doing with PT; did report naming colors, etc that she has been doing with OT and SLP.  Pt cued to transport herself down the hallway in the w/c; pt self selected to use bilat feet for propulsion.  Pt performed w/c mobility x 150' with bilat feet propulsion with mod A, extra time and moderate one step, global verbal cues to initiate and sustain attention to task.  In gym pt cued to remove socks and don  shoes over TED hose; pt initiated removal of socks with min cues but required max cues to sequence donning and tying shoes by reaching down to them to facilitate anterior weight shift/leaning.  Pt performed sit > stand from w/c with max A with therapist placing pt UE over shoulder to allow therapist to provide manual facilitation to initiate anterior lean/weight shift and allow pt extra time to initiate hip and knee extension once COG over BOS.  Once in standing pt performed transfer to simulated car by ambulating with +2 HHA.  Once seated in car pt given global, one step commands and allowed extra time to initiate and sequence placing feet into and out of car, buckling seat belt, and closing/opening door.  Performed sit > stand from car with max A and performed gait x 100' with mod-max A with RUE around therapist's shoulders with therapist providing manual facilitation for trunk control (activation of abdominal secondary to posterior lean), anterior and lateral weight shifting and cues for navigation; pt also required cues for initiation but then able to maintain slow but consistent gait velocity but pt would present with intermittent bilat knee buckling with pt attempting to sit prior to a chair being present.  Attempted automatic sit >  stand training from elevated mat with use of functional activity typically performed in standing (tennis); pt would initiate forward weight shift to place feet on the floor but demonstrated resistance to anterior lean and attempting to pull on therapist to stand or asking therapist to "pull me up!".  With continued encouragement pt became more resistant.  Performed squat pivot back to w/c max A and in room pt required +2 for stand pivot back to bed secondary to fatigue and increased resistance to standing.  Required mod A for sit > supine secondary to poor initiation.      Therapy Documentation Precautions:  Precautions Precautions: Fall Precaution Comments: impulsive,  decreased safety awareness Restrictions Weight Bearing Restrictions: No Pain: Pain Assessment Pain Assessment: No/denies pain Locomotion : Ambulation Ambulation/Gait Assistance: 3: Mod assist Wheelchair Mobility Distance: 150   See FIM for current functional status  Therapy/Group: Individual Therapy   Faucette Hall 12/20/2013, 2:32 PM  

## 2013-12-21 ENCOUNTER — Inpatient Hospital Stay (HOSPITAL_COMMUNITY): Payer: BC Managed Care – PPO | Admitting: Physical Therapy

## 2013-12-21 ENCOUNTER — Inpatient Hospital Stay (HOSPITAL_COMMUNITY): Payer: BC Managed Care – PPO | Admitting: Speech Pathology

## 2013-12-21 DIAGNOSIS — F341 Dysthymic disorder: Secondary | ICD-10-CM

## 2013-12-21 DIAGNOSIS — I10 Essential (primary) hypertension: Secondary | ICD-10-CM

## 2013-12-21 DIAGNOSIS — C7949 Secondary malignant neoplasm of other parts of nervous system: Secondary | ICD-10-CM

## 2013-12-21 DIAGNOSIS — C7931 Secondary malignant neoplasm of brain: Secondary | ICD-10-CM

## 2013-12-21 DIAGNOSIS — R4182 Altered mental status, unspecified: Secondary | ICD-10-CM

## 2013-12-21 LAB — GLUCOSE, CAPILLARY
GLUCOSE-CAPILLARY: 82 mg/dL (ref 70–99)
GLUCOSE-CAPILLARY: 95 mg/dL (ref 70–99)
GLUCOSE-CAPILLARY: 111 mg/dL — AB (ref 70–99)
Glucose-Capillary: 117 mg/dL — ABNORMAL HIGH (ref 70–99)
Glucose-Capillary: 77 mg/dL (ref 70–99)

## 2013-12-21 NOTE — Progress Notes (Signed)
Physical Therapy Session Note  Patient Details  Name: Katie [name] MRN: [id number] Date of Birth: April 01, 1951  Today's Date: 12/21/2013 Time: 4401-0272 Time Calculation (min): 45 min  Short Term Goals: Week 2:  PT Short Term Goal 1 (Week 2): Pt to perform bed mobility with min Ax1person PT Short Term Goal 2 (Week 2): Pt to perform bed<>w/c tranfsers w/ mod Ax1person 75% of time PT Short Term Goal 3 (Week 2): Pt will ambulate 100' w/ mod A x1person 50% of time PT Short Term Goal 4 (Week 2): Pt will negotiate up/down 2 steps w/ max A x1person PT Short Term Goal 5 (Week 2): Pt will propel w/c x100' w/ B LE and min Ax1person 50% of time  Skilled Therapeutic Interventions/Progress Updates:   Pt received semi reclined in bed, husband reporting that pt requested need for bathroom. Supine > sit with HOB elevated with mod A and max multimodal cues to initiate scooting LEs to edge of bed. Therapist assisted pt with donning TED hose and shoes/AFO and threading pants through BLEs, max multimodal cues for pt to perform anterior lean to pull pants up. Sit <> stand with max A x 1 and UE around therapist while husband assisted in pulling pants all the way up. Sitting in w/c pt donned undershirt/shirt with min assist, max multimodal cues to complete task. Pt transported to/from bathroom in w/c and performed stand pivot w/c <> commode max A x 1. Pt noted to be incontinent of urine, brief changed. Pt requires max verbal cues for sequencing hygiene. W/c propulsion 100 ft using BLEs and global verbal cues for initiating and sustaining attention to task/encouragement with mod A. Sit <> stand from w/c with max A and pt UE over therapist's shoulder to facilitate anterior weightshift and hip/knee extension in standing. Gait training 300 ft x 2 with pt UE over therapist shoulder with mod A and HHA from husband, therapist facilitating anterior weightshift secondary to posterior lean/inability to maintain COG over BOS. Pt with  no episodes of knee buckling this session. Pt performed car transfer with global cues for sequencing and mod A for B hip flexion to bring feet into/out of car. Pt unable to identify simulated car or name car parts (steering wheel, seatbelt) when questioned by spouse. Pt returned to room and left sitting in w/c with quick release belt on and husband present.  Therapy Documentation Precautions:  Precautions Precautions: Fall Precaution Comments: impulsive, decreased safety awareness Restrictions Weight Bearing Restrictions: No Pain: Pain Assessment Pain Assessment: No/denies pain Locomotion : Ambulation Ambulation/Gait Assistance: 3: Mod assist Wheelchair Mobility Distance: 100   See FIM for current functional status  Therapy/Group: Individual Therapy  Laretta Alstrom 12/21/2013, 12:14 PM

## 2013-12-21 NOTE — Progress Notes (Signed)
INITIAL NUTRITION ASSESSMENT  DOCUMENTATION CODES Per approved criteria  -Not Applicable   INTERVENTION: 1.  Supplements; continue Ensure Pudding po TID, each supplement provides 170 kcal and 4 grams of protein 2.  Continue MVI.  NUTRITION DIAGNOSIS: Increased nutrient needs related to healing, increased nutrient demand as evidenced by pt admitted to intensive rehab program, s/p brain surgery.   Monitor:  1.  Food/Beverage; pt meeting >/=90% estimated needs with tolerance. 2.  Wt/wt change; monitor trends  Reason for Assessment: MST  63 y.o. female  Admitting Dx: deconditioning  ASSESSMENT: Pt admitted with deconditioning s/p brain tumor removal.  Pt has been eating well since admission to rehab.  PO 100% of meals.  She is also ordered Ensure Pudding TID.  Pt is easily distracted during visit. Unable to contribute to nutrition hx.  Family at bedside states she is eating well and "cleaning her plate."  Nutrition Focused Physical Exam: Subcutaneous Fat:  Orbital Region: WNL Upper Arm Region: WNL Thoracic and Lumbar Region: WNL  Muscle:  Temple Region: WNL Clavicle Bone Region: mild wasting Clavicle and Acromion Bone Region: mild wasting Scapular Bone Region: mild wasting Dorsal Hand: mild wasting Patellar Region: not assessed Anterior Thigh Region: not assessed Posterior Calf Region: not assessed  Edema: none present  RD notes pt with recent dx of moderate malnutrition.  This is likely in setting of acute illness due to recent onset of symptoms at home as well as high acuity admission r/t to brain surgery.  This is resolving/resolved as pt has been supported with artificial nutrition during acute admission, and is eating well on inpatient rehab.  RD to follow for ongoing needs.   Height: Ht Readings from Last 1 Encounters:  12/06/13 5\' 7"  (1.702 m)    Weight: Wt Readings from Last 1 Encounters:  12/18/13 130 lb 8.2 oz (59.2 kg)    Ideal Body Weight: 135  lbs  % Ideal Body Weight: 96%  Wt Readings from Last 10 Encounters:  12/18/13 130 lb 8.2 oz (59.2 kg)  12/03/13 119 lb 14.9 oz (54.4 kg)  12/03/13 119 lb 14.9 oz (54.4 kg)  12/03/13 119 lb 14.9 oz (54.4 kg)    Usual Body Weight: 130-135 lbs per chart review  % Usual Body Weight: 100%  BMI:  Body mass index is 20.44 kg/(m^2).  Estimated Nutritional Needs: Kcal: 1650-1850 Protein: 90-110g Fluid: ~1.8 L/day  Skin: healed incision to head  Diet Order: Dysphagia 3, nectar-thick  EDUCATION NEEDS: -No education needs identified at this time   Intake/Output Summary (Last 24 hours) at 12/21/13 1332 Last data filed at 12/21/13 0800  Gross per 24 hour  Intake    340 ml  Output      0 ml  Net    340 ml    Last BM: 4/1  Labs:   Recent Labs Lab 12/18/13 0445  NA 134*  K 4.6  CL 95*  CO2 28  BUN 25*  CREATININE 0.47*  CALCIUM 8.5  GLUCOSE 113*    CBG (last 3)   Recent Labs  12/21/13 0352 12/21/13 0721 12/21/13 1121  GLUCAP 117* 82 77    Scheduled Meds: . antiseptic oral rinse  15 mL Mouth Rinse QID  . atorvastatin  10 mg Oral QPM  . cyanocobalamin  500 mcg Oral Daily  . dexamethasone  4 mg Oral Q12H  . enoxaparin (LOVENOX) injection  40 mg Subcutaneous Q24H  . escitalopram  10 mg Oral Daily  . feeding supplement (ENSURE)  1 Container  Oral TID BM  . heparin lock flush  500 Units Intracatheter Q30 days  . hydrocerin   Topical BID  . insulin aspart  0-15 Units Subcutaneous 6 times per day  . levETIRAcetam  500 mg Oral BID  . methylphenidate  10 mg Oral BID WC  . multivitamin with minerals  1 tablet Oral Daily  . pantoprazole  40 mg Oral Daily    Continuous Infusions:   Past Medical History  Diagnosis Date  . Anxiety     takes lexapro    Past Surgical History  Procedure Laterality Date  . Appendectomy    . Colon surgery      polyps  removed  . Craniotomy Right 11/12/2013    Procedure: RIGHT PARIETAL CRANIOTOMY;  Surgeon: Kristeen Miss, MD;   Location: [place] [facility];  Service: Neurosurgery;  Laterality: Right;  . 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

## 2013-12-21 NOTE — Progress Notes (Signed)
[facility]     PROGRESS NOTE    Subjective/Complaints: Good appetite this am per husband. Slept well. No complaints.Difficult locating clock on wall BM 0150 last noc pt doesn't recall Ros limited due to cognition/language.  Objective: Vital Signs: Blood pressure 119/69, pulse 62, temperature 97.8 F (36.6 C), temperature source Axillary, resp. rate 17, height 5\' 7"  (1.702 m), weight 59.2 kg (130 lb 8.2 oz), SpO2 100.00%. No results found. No results found for this basename: WBC, HGB, HCT, PLT,  in the last 72 hours No results found for this basename: NA, K, CL, CO, GLUCOSE, BUN, CREATININE, CALCIUM,  in the last 72 hours CBG (last 3)   Recent Labs  12/20/13 2350 12/21/13 0352 12/21/13 0721  GLUCAP 118* 117* 82    Wt Readings from Last 3 Encounters:  12/18/13 59.2 kg (130 lb 8.2 oz)  12/03/13 54.4 kg (119 lb 14.9 oz)  12/03/13 54.4 kg (119 lb 14.9 oz)    Physical Exam:  Constitutional: No distress.  Frail appearing, alert  HENT:  Head: Normocephalic and atraumatic.  Right Ear: External ear normal.  Left Ear: External ear normal.  Eyes: Conjunctivae and EOM are normal. Pupils are equal, round, and reactive to light.  Neck: No JVD present. No tracheal deviation present. No thyromegaly present.  Cardiovascular: Normal rate and regular rhythm. Exam reveals friction rub. Exam reveals no gallop.  No murmur heard.  Respiratory: No respiratory distress. She has no wheezes. She has no rales. She exhibits no tenderness.  GI: She exhibits no distension.Soft  There is no tenderness. There is no rebound.  Lymphadenopathy:  She has no cervical adenopathy.  Neurological:  Attention waxes and wanes stil.    Language of confusion persistent. Typically keeps eyes closed unless cued. moves all 4 limbs. Senses pain.  left HH and likely ?diplopia  Skin: Skin is warm. Op site intact with some fluid still under skin---no real change Psychiatric:    confused   Assessment/Plan: 1. 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 Physiatrist is providing close team supervision and 24 hour management of active medical problems listed below. Physiatrist and rehab team continue to assess barriers to discharge/monitor patient progress toward functional and medical goals.  3 hrs of therapy per day---pace/spread out sessions.    FIM: FIM - Bathing Bathing Steps Patient Completed: Chest;Right Arm;Left Arm;Abdomen;Front perineal area;Right upper leg;Left upper leg;Right lower leg (including foot) Bathing: 4: Min-Patient completes 8-9 71f 10 parts or 75+ percent  FIM - Upper Body Dressing/Undressing Upper body dressing/undressing steps patient completed: Thread/unthread right sleeve of pullover shirt/dresss;Thread/unthread left sleeve of pullover shirt/dress;Put head through opening of pull over shirt/dress;Pull shirt over trunk Upper body dressing/undressing: 5: Supervision: Safety issues/verbal cues FIM - Lower Body Dressing/Undressing Lower body dressing/undressing steps patient completed: Thread/unthread left pants leg;Thread/unthread right pants leg;Don/Doff right sock;Don/Doff left sock Lower body dressing/undressing: 2: Max-Patient completed 25-49% of tasks  FIM - Toileting Toileting steps completed by patient: Performs perineal hygiene;Adjust clothing prior to toileting Toileting Assistive Devices: Grab bar or rail for support Toileting: 3: Mod-Patient completed 2 of 3 steps  FIM - Radio producer Devices: Grab bars Toilet Transfers: 3-To toilet/BSC: Mod A (lift or lower assist);3-From toilet/BSC: Mod A (lift or lower assist)  FIM - Bed/Chair Transfer Bed/Chair Transfer Assistive Devices: Arm rests Bed/Chair Transfer: 3: Supine > Sit: Mod A (lifting assist/Pt. 50-74%/lift  2 legs;3: Sit  > Supine: Mod A (lifting assist/Pt. 50-74%/lift 2 legs);2: Bed > Chair or W/C: Max A (lift and lower assist);2: Chair or W/C > Bed: Max A (lift and lower assist)  FIM - Locomotion: Wheelchair Distance: 150 Locomotion: Wheelchair: 3: Travels 150 ft or more: maneuvers on rugs and over door sills with moderate assistance  (Pt: 50 - 74%) FIM - Locomotion: Ambulation Locomotion: Ambulation Assistive Devices: Other (comment) (UE over therapist shoulders) Ambulation/Gait Assistance: 3: Mod assist Locomotion: Ambulation: 2: Travels 50 - 149 ft with moderate assistance (Pt: 50 - 74%)  Comprehension Comprehension Mode: Auditory Comprehension: 3-Understands basic 50 - 74% of the time/requires cueing 25 - 50%  of the time  Expression Expression Mode: Verbal Expression: 3-Expresses basic 50 - 74% of the time/requires cueing 25 - 50% of the time. Needs to repeat parts of sentences.  Social Interaction Social Interaction: 3-Interacts appropriately 50 - 74% of the time - May be physically or verbally inappropriate.  Problem Solving Problem Solving: 2-Solves basic 25 - 49% of the time - needs direction more than half the time to initiate, plan or complete simple activities  Memory Memory: 1-Recognizes or recalls less than 25% of the time/requires cueing greater than 75% of the time   Medical Problem List and Plan:  Right temporal occipital metastatic tumor (SCLC)--  -XRT completed  -steroid taper--decadron--decrease to 4mg  q12 -CTX as outpt  1. DVT Prophylaxis/Anticoagulation: Pharmaceutical: Lovenox  2. Pain Management: tylenol, hydrocodone  3. Mood: Resumed Lexapro.   4. Neuropsych: This patient is not capable of making decisions on her own behalf.   -attention has improved a bit with ritalin but still with profound impairments cognitively 5. FEN/malnutrition---D3 Nectars---   -eating 100% at present  -continue to push fluids   -water protocol---lytes improving 6. Sz  prophylaxis---keppra  LOS (Days) 15 A FACE TO FACE EVALUATION WAS PERFORMED  Charlett Blake 12/21/2013 8:34 AM

## 2013-12-21 NOTE — Progress Notes (Signed)
Speech Language Pathology Daily Session Note  Patient Details  Name: Katie [name] MRN: [id number] Date of Birth: 11/28/1950  Today's Date: 12/21/2013 Time: 1430-1500 Time Calculation (min): 30 min  Short Term Goals: Week 2: SLP Short Term Goal 1 (Week 2): Patient will consume PO with Min level verbal cues for pacing and portion control. SLP Short Term Goal 2 (Week 2): Patient will sustain attention to task for 1 minute with Max verbal cues for redirection. SLP Short Term Goal 3 (Week 2): Patient will utilize external aids for orientation with Max assist.  SLP Short Term Goal 4 (Week 2): Patient will label 2 deficits with Max clinician cues.  SLP Short Term Goal 5 (Week 2): Patient will solve basic problems with Max assist. SLP Short Term Goal 6 (Week 2): Patient maintain topic of conversation for 2 turns with Mod clinician cues.  Skilled Therapeutic Interventions: Skilled treatment session focused on dysphagia and cognitive goals. SLP provided setup and supervision for patient to complete oral care prior to PO.'s of plain water by cup sip. Patient responded well to verbal cues for "itty bitty sip" and complied without additional cues. No overt s/s aspiration noted, and swallow initiation was timely for all of the approximately 10-12 sips. Patient's spouse and sister were both present, and they asked about when she could be advanced with liquids (curently on nectar thick). SLP informed them that another swallow study would need to be completed first. SLP also directed patient in cognitive task, for attention and accuracy in naming/describing. Patient presented with playing cards with different shapes, colors of shapes, and numbers of shapes (Blink card game). Patient was 75% accurate in identifying shape, 70% accurate in identifying number of shapes, and fluctuated with accuracy for identifying colors (60-75%). She became perseverative on calling brown and blue "black", which may be because she was  thinking about regular playing cards. Her accuracy improved when she was cued to hold the card and look, and also benefitted from verbal cues to redirect away from perseveration on thoughts/incorrect response. Continue with plan of care.   FIM:  Comprehension Comprehension Mode: Auditory Comprehension: 3-Understands basic 50 - 74% of the time/requires cueing 25 - 50%  of the time Expression Expression Mode: Verbal Expression: 2-Expresses basic 25 - 49% of the time/requires cueing 50 - 75% of the time. Uses single words/gestures. Social Interaction Social Interaction: 2-Interacts appropriately 25 - 49% of time - Needs frequent redirection. Problem Solving Problem Solving: 2-Solves basic 25 - 49% of the time - needs direction more than half the time to initiate, plan or complete simple activities Memory Memory: 1-Recognizes or recalls less than 25% of the time/requires cueing greater than 75% of the time  Pain Pain Assessment Pain Assessment: No/denies pain Pain Score: 0-No pain  Therapy/Group: Individual Therapy  Dannial Monarch 12/21/2013, 3:51 PM  Sonia Baller, MA, CCC-SLP [facility] Speech-Language Pathologist

## 2013-12-22 ENCOUNTER — Inpatient Hospital Stay (HOSPITAL_COMMUNITY): Payer: BC Managed Care – PPO | Admitting: Physical Therapy

## 2013-12-22 LAB — GLUCOSE, CAPILLARY
GLUCOSE-CAPILLARY: 128 mg/dL — AB (ref 70–99)
GLUCOSE-CAPILLARY: 133 mg/dL — AB (ref 70–99)
GLUCOSE-CAPILLARY: 152 mg/dL — AB (ref 70–99)
Glucose-Capillary: 122 mg/dL — ABNORMAL HIGH (ref 70–99)
Glucose-Capillary: 111 mg/dL — ABNORMAL HIGH (ref 70–99)
Glucose-Capillary: 173 mg/dL — ABNORMAL HIGH (ref 70–99)

## 2013-12-22 NOTE — Progress Notes (Signed)
PHYSICAL MEDICINE & REHABILITATION     PROGRESS NOTE    Subjective/Complaints: Requires cues for scanning  No pain c/o no breathing problems reported  Ros limited due to cognition/language.  Objective: Vital Signs: Blood pressure 115/68, pulse 65, temperature 98.4 F (36.9 C), temperature source Oral, resp. rate 19, height 5\' 7"  (1.702 m), weight 59.2 kg (130 lb 8.2 oz), SpO2 97.00%. No results found. No results found for this basename: WBC, HGB, HCT, PLT,  in the last 72 hours No results found for this basename: NA, K, CL, CO, GLUCOSE, BUN, CREATININE, CALCIUM,  in the last 72 hours CBG (last 3)   Recent Labs  12/21/13 1955 12/22/13 0005 12/22/13 0406  GLUCAP 111* 133* 122*    Wt Readings from Last 3 Encounters:  12/18/13 59.2 kg (130 lb 8.2 oz)  12/03/13 54.4 kg (119 lb 14.9 oz)  12/03/13 54.4 kg (119 lb 14.9 oz)    Physical Exam:  Constitutional: No distress.  Frail appearing, alert  HENT:  Head: Normocephalic and atraumatic.  Right Ear: External ear normal.  Left Ear: External ear normal.  Eyes: Conjunctivae and EOM are normal. Pupils are equal, round, and reactive to light.  Neck: No JVD present. No tracheal deviation present. No thyromegaly present.  Cardiovascular: Normal rate and regular rhythm. Exam reveals friction rub. Exam reveals no gallop.  No murmur heard.  Respiratory: No respiratory distress. She has no wheezes. She has no rales. She exhibits no tenderness.  GI: She exhibits no distension.Soft  There is no tenderness. There is no rebound.  Lymphadenopathy:  She has no cervical adenopathy.  Neurological:  Attention waxes and wanes stil.    Language of confusion persistent. Typically keeps eyes closed unless cued. moves all 4 limbs. Senses pain.  left HH and likely ?diplopia  Skin: Skin is warm. Op site intact with some fluid still under skin---no real change Psychiatric:   confused   Assessment/Plan: 1. 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 Physiatrist is providing close team supervision and 24 hour management of active medical problems listed below. Physiatrist and rehab team continue to assess barriers to discharge/monitor patient progress toward functional and medical goals.  3 hrs of therapy per day---pace/spread out sessions.  Today has only one hour   FIM: FIM - Bathing Bathing Steps Patient Completed: Chest;Right Arm;Left Arm;Abdomen;Front perineal area;Right upper leg;Left upper leg;Right lower leg (including foot) Bathing: 4: Min-Patient completes 8-9 11f 10 parts or 75+ percent  FIM - Upper Body Dressing/Undressing Upper body dressing/undressing steps patient completed: Thread/unthread right sleeve of pullover shirt/dresss;Thread/unthread left sleeve of pullover shirt/dress;Put head through opening of pull over shirt/dress;Pull shirt over trunk Upper body dressing/undressing: 5: Supervision: Safety issues/verbal cues FIM - Lower Body Dressing/Undressing Lower body dressing/undressing steps patient completed: Thread/unthread left pants leg;Thread/unthread right pants leg;Don/Doff right sock;Don/Doff left sock Lower body dressing/undressing: 2: Max-Patient completed 25-49% of tasks  FIM - Toileting Toileting steps completed by patient: Performs perineal hygiene;Adjust clothing prior to toileting Toileting Assistive Devices: Grab bar or rail for support Toileting: 3: Mod-Patient completed 2 of 3 steps  FIM - Radio producer Devices: Grab bars Toilet Transfers: 3-To toilet/BSC: Mod A (lift or lower assist);3-From toilet/BSC: Mod A (lift or lower assist)  FIM - Bed/Chair Transfer Bed/Chair Transfer Assistive Devices: Arm rests Bed/Chair Transfer: 3: Supine > Sit: Mod A (lifting assist/Pt. 50-74%/lift 2 legs;2: Bed >  Chair or W/C: Max A (lift and lower  assist);2: Chair or W/C > Bed: Max A (lift and lower assist)  FIM - Locomotion: Wheelchair Distance: 100 Locomotion: Wheelchair: 2: Travels 50 - 149 ft with moderate assistance (Pt: 50 - 74%) FIM - Locomotion: Ambulation Locomotion: Ambulation Assistive Devices: Other (comment) (3 musketeers style/therapist under UE ) Ambulation/Gait Assistance: 3: Mod assist Locomotion: Ambulation: 2: Travels 50 - 149 ft with moderate assistance (Pt: 50 - 74%)  Comprehension Comprehension Mode: Auditory Comprehension: 3-Understands basic 50 - 74% of the time/requires cueing 25 - 50%  of the time  Expression Expression Mode: Verbal Expression: 2-Expresses basic 25 - 49% of the time/requires cueing 50 - 75% of the time. Uses single words/gestures.  Social Interaction Social Interaction: 2-Interacts appropriately 25 - 49% of time - Needs frequent redirection.  Problem Solving Problem Solving: 2-Solves basic 25 - 49% of the time - needs direction more than half the time to initiate, plan or complete simple activities  Memory Memory: 1-Recognizes or recalls less than 25% of the time/requires cueing greater than 75% of the time   Medical Problem List and Plan:  Right temporal occipital metastatic tumor (SCLC)--  -XRT completed  -steroid taper--decadron--decrease to 4mg  q12 -CTX as outpt  1. DVT Prophylaxis/Anticoagulation: Pharmaceutical: Lovenox  2. Pain Management: tylenol, hydrocodone  3. Mood: Resumed Lexapro.   4. Neuropsych: This patient is not capable of making decisions on her own behalf.   -attention has improved a bit with ritalin but still with profound impairments cognitively 5. FEN/malnutrition---D3 Nectars---   -eating 100% at present  -continue to push fluids   -water protocol---lytes improving 6. Sz prophylaxis---keppra  LOS (Days) 16 A FACE TO FACE EVALUATION WAS PERFORMED  Charlett Blake 12/22/2013 7:03 AM

## 2013-12-22 NOTE — Progress Notes (Signed)
Physical Therapy Session Note  Patient Details  Name: Katie [name] MRN: [id number] Date of Birth: 1950-10-27  Today's Date: 12/22/2013 Time: 1120-1205 Time Calculation (min): 45 min  Short Term Goals: Week 2:  PT Short Term Goal 1 (Week 2): Pt to perform bed mobility with min Ax1person PT Short Term Goal 2 (Week 2): Pt to perform bed<>w/c tranfsers w/ mod Ax1person 75% of time PT Short Term Goal 3 (Week 2): Pt will ambulate 100' w/ mod A x1person 50% of time PT Short Term Goal 4 (Week 2): Pt will negotiate up/down 2 steps w/ max A x1person PT Short Term Goal 5 (Week 2): Pt will propel w/c x100' w/ B LE and min Ax1person 50% of time  Skilled Therapeutic Interventions/Progress Updates:   Pt received sitting in w/c, husband present. Husband requesting shower for pt this date and RN tech requesting assist from therapy for patient safety with mobility. Pt engaged in bathing at shower level and dressing with sit <> stand from w/c with RN tech and therapist. Pt performed stand pivot transfer w/c <> shower seat with max A. Pt with max-total assist for bathing tasks. Therapist threaded pants and pt cued for anterior lean to pull up pants. Sit <> stand with max A and pt assisted as husband pulled up pants in standing. Pt required min assist and max verbal cues for sequencing UB dressing. Gait training with pt UE over therapist's shoulder room <> day room on hard level surface and carpet with mod assist and manual facilitation of COG over BOS as pt continues to demo posterior lean. Sit <> stand from standard chair with arm rests in day room and max A, pt with improved command following for anterior weightshift throughout session. Returned to room and pt left sitting in w/c with quick release belt on and husband present.   Therapy Documentation Precautions:  Precautions Precautions: Fall Precaution Comments: impulsive, decreased safety awareness Restrictions Weight Bearing Restrictions: No Pain: Pain  Assessment Pain Assessment: No/denies pain  See FIM for current functional status  Therapy/Group: Individual Therapy  Laretta Alstrom 12/22/2013, 12:34 PM

## 2013-12-23 ENCOUNTER — Inpatient Hospital Stay (HOSPITAL_COMMUNITY): Payer: BC Managed Care – PPO

## 2013-12-23 ENCOUNTER — Inpatient Hospital Stay (HOSPITAL_COMMUNITY): Payer: BC Managed Care – PPO | Admitting: Occupational Therapy

## 2013-12-23 ENCOUNTER — Inpatient Hospital Stay (HOSPITAL_COMMUNITY): Payer: BC Managed Care – PPO | Admitting: Speech Pathology

## 2013-12-23 DIAGNOSIS — R4182 Altered mental status, unspecified: Secondary | ICD-10-CM

## 2013-12-23 DIAGNOSIS — I10 Essential (primary) hypertension: Secondary | ICD-10-CM

## 2013-12-23 DIAGNOSIS — C7949 Secondary malignant neoplasm of other parts of nervous system: Secondary | ICD-10-CM

## 2013-12-23 DIAGNOSIS — C7931 Secondary malignant neoplasm of brain: Secondary | ICD-10-CM

## 2013-12-23 DIAGNOSIS — F341 Dysthymic disorder: Secondary | ICD-10-CM

## 2013-12-23 LAB — GLUCOSE, CAPILLARY
GLUCOSE-CAPILLARY: 119 mg/dL — AB (ref 70–99)
Glucose-Capillary: 104 mg/dL — ABNORMAL HIGH (ref 70–99)
Glucose-Capillary: 111 mg/dL — ABNORMAL HIGH (ref 70–99)
Glucose-Capillary: 115 mg/dL — ABNORMAL HIGH (ref 70–99)
Glucose-Capillary: 122 mg/dL — ABNORMAL HIGH (ref 70–99)
Glucose-Capillary: 123 mg/dL — ABNORMAL HIGH (ref 70–99)

## 2013-12-23 NOTE — Plan of Care (Signed)
Problem: RH Bed Mobility Goal: LTG Patient will perform bed mobility with assist (PT) LTG: Patient will perform bed mobility with assistance, with/without cues (PT).  Goal downgraded due to anticipated level of physical assistance needed at d/c.   Problem: RH Bed to Chair Transfers Goal: LTG Patient will perform bed/chair transfers w/assist (PT) LTG: Patient will perform bed/chair transfers with assistance, with/without cues (PT).  Goal downgraded due to anticipated level of physical assistance required at d/c.   Problem: RH Car Transfers Goal: LTG Patient will perform car transfers with assist (PT) LTG: Patient will perform car transfers with assistance (PT).  Goal downgraded due to anticipated level of physical assistance required at d/c.   Problem: RH Ambulation Goal: LTG Patient will ambulate in controlled environment (PT) LTG: Patient will ambulate in a controlled environment, # of feet with assistance (PT).  Goal downgraded due to anticipated level of physical assistance required at d/c.  Goal: LTG Patient will ambulate in home environment (PT) LTG: Patient will ambulate in home environment, # of feet with assistance (PT).  Outcome: Not Applicable Date Met:  15/87/27 Goal not applicable as pt is not anticipated to safely ambulate with family in home environment at d/c.   Problem: RH Wheelchair Mobility Goal: LTG Patient will propel w/c in community environment (PT) LTG: Patient will propel wheelchair in community environment, # of feet with assist (PT)  Outcome: Not Applicable Date Met:  61/84/85 Goal not appropriate for pt current and anticipated level of functional mobility.   Problem: RH Stairs Goal: LTG Patient will ambulate up and down stairs w/assist (PT) LTG: Patient will ambulate up and down # of stairs with assistance (PT)  Outcome: Not Applicable Date Met:  92/76/39 D/c goal due to pt's inconsistent ability to perform and level of assistance required. Husband  educated on benefit of w/c ramp for safety and consistency.

## 2013-12-23 NOTE — Progress Notes (Signed)
Physical Therapy Session Note  Patient Details  Name: Katie [name] MRN: [id number] Date of Birth: 1951/06/07  Today's Date: 12/23/2013 Time: Treatment Session 1: 0900-1000; Treatment Session 2: 1305-1330 Time Calculation (min): Treatment Session 1: 60 min; Treatment Session 2: 87min  Short Term Goals: Week 2:  PT Short Term Goal 1 (Week 2): Pt to perform bed mobility with min Ax1person PT Short Term Goal 2 (Week 2): Pt to perform bed<>w/c tranfsers w/ mod Ax1person 75% of time PT Short Term Goal 3 (Week 2): Pt will ambulate 100' w/ mod A x1person 50% of time PT Short Term Goal 4 (Week 2): Pt will negotiate up/down 2 steps w/ max A x1person PT Short Term Goal 5 (Week 2): Pt will propel w/c x100' w/ B LE and min Ax1person 50% of time  Skilled Therapeutic Interventions/Progress Updates:  Treatment Session 1:  1:1. Pt received sitting in w/c, ready for therapy. Focus this session on sustained attention, initiation, motor planning and following simple single step command during repeated functional mobility. Pt req max multimodal cues throughout session to complete functional task. Pt demonstrates increased success or decreased need for physical assist when EO and visualizing directions from therapist as well as target. Pt initially req mod-max A x1person for multiple transfers car<>w/c<>bed/mat.  Ambulation and blocked t/f sit<>stand/minisquats completed w/ min-max A x2persons using 3 musketeer technique w/ manual facilitation for hip flexion for improved alignment over BOS. Pt propelled w/c 100' back to room w/ B LE and mod A back to room at end of session due to fatigue. Pt supine in bed at end of session w/ all needs in reach, bed alarm on and husband in room.    Treatment Session 2:  1:1. Pt received sitting in w/c, ready for therapy. Continued focus this session sustained attention, initiation and motor planning during functional mobility. Pt able to propel w/c room>thearpy apartment w/ min A by  therapist holding pt's L hand to assist with steering/propulsion. Pt req intermittent mod cueing due to drifting L as well as redirection to task 3x. Emphasis in therapy apartment on t/f bed<>w/c, trialing different methods including therapist positioned in front with use of bobath technique, side and behind. Overall, pt req mod-max A. Regardless of method, pt req decreased physical assistance whan able to achieved adequate trunk flexion for anterior weight shift to engage B LE for bottom clearance. Pt sitting in w/c at end of session w/ all needs in reach, quick release belt in place and husband in room.   Therapy Documentation Precautions:  Precautions Precautions: Fall Precaution Comments: impulsive, decreased safety awareness Restrictions Weight Bearing Restrictions: No  See FIM for current functional status  Therapy/Group: Individual Therapy  Gilmore Laroche 12/23/2013, 10:04 AM

## 2013-12-23 NOTE — Progress Notes (Signed)
Occupational Therapy Session Note  Patient Details  Name: Katie [name] MRN: [id number] Date of Birth: November 17, 1950  Today's Date: 12/23/2013 Time: 1100-1200 Time Calculation (min): 60 min  Short Term Goals: Week 2:  OT Short Term Goal 1 (Week 2): Pt will complete toileting with max A OT Short Term Goal 2 (Week 2): Pt will complete lower body dressing with max A OT Short Term Goal 3 (Week 2): Pt will perform toilet transfer with min A OT Short Term Goal 4 (Week 2): Pt will perform shower transfer with mod A  Skilled Therapeutic Interventions/Progress Updates:  Patient sleeping in bed upon arrival with husband at her side.  Engaged in self care retraining to include shower, dress and groom.  Focused session on activity tolerance, keeping eyes open, sustained attention, initiation and sequencing, decrease perseveration, safe transfers, sit><stands.  Patient required max assist with all mobility and occassionally +2 for sit>stand to pull up pants   Husband left at beginning of session then returned for ~last 20 minutes.  Husband often assisting when assistance might not be needed and occasionally providing cues when might not be needed.  Husband appears very supportive and helpful.  Therapy Documentation Precautions:  Precautions Precautions: Fall Precaution Comments: impulsive, decreased safety awareness Restrictions Weight Bearing Restrictions: No Pain: Denies pain ADL: See FIM for current functional status  Therapy/Group: Individual Therapy  Gaye Pollack 12/23/2013, 5:12 PM

## 2013-12-23 NOTE — Progress Notes (Signed)
[facility] PHYSICAL MEDICINE & REHABILITATION     PROGRESS NOTE    Subjective/Complaints: No major changes.  A 12 point review of systems has been performed and if not noted above is otherwise negative.  Ros limited due to cognition/language.  Objective: Vital Signs: Blood pressure 122/74, pulse 67, temperature 97.9 F (36.6 C), temperature source Oral, resp. rate 18, height 5\' 7"  (1.702 m), weight 59.2 kg (130 lb 8.2 oz), SpO2 100.00%. No results found. No results found for this basename: WBC, HGB, HCT, PLT,  in the last 72 hours No results found for this basename: NA, K, CL, CO, GLUCOSE, BUN, CREATININE, CALCIUM,  in the last 72 hours CBG (last 3)   Recent Labs  12/22/13 1946 12/23/13 0015 12/23/13 0415  GLUCAP 128* 111* 119*    Wt Readings from Last 3 Encounters:  12/18/13 59.2 kg (130 lb 8.2 oz)  12/03/13 54.4 kg (119 lb 14.9 oz)  12/03/13 54.4 kg (119 lb 14.9 oz)    Physical Exam:  Constitutional: No distress.  Frail appearing, alert  HENT:  Head: Normocephalic and atraumatic.  Right Ear: External ear normal.  Left Ear: External ear normal.  Eyes: Conjunctivae and EOM are normal. Pupils are equal, round, and reactive to light.  Neck: No JVD present. No tracheal deviation present. No thyromegaly present.  Cardiovascular: Normal rate and regular rhythm. Exam reveals friction rub. Exam reveals no gallop.  No murmur heard.  Respiratory: No respiratory distress. She has no wheezes. She has no rales. She exhibits no tenderness.  GI: She exhibits no distension.Soft  There is no tenderness. There is no rebound.  Lymphadenopathy:  She has no cervical adenopathy.  Neurological:  Attention waxes and wanes stil.    Language of confusion persistent. Typically keeps eyes closed unless cued. moves all 4 limbs. Senses pain.  left HH and likely ?diplopia  Skin: Skin is warm. Op site intact with some fluid still under skin---no real change Psychiatric:    confused   Assessment/Plan: 1. 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 Physiatrist is providing close team supervision and 24 hour management of active medical problems listed below. Physiatrist and rehab team continue to assess barriers to discharge/monitor patient progress toward functional and medical goals.  3 hrs of therapy per day---pace/spread out sessions.  Will need to look at changing gears and family ed     FIM: FIM - Bathing Bathing Steps Patient Completed: Chest;Right Arm;Left Arm;Abdomen;Front perineal area;Right upper leg;Left upper leg;Right lower leg (including foot) Bathing: 4: Min-Patient completes 8-9 76f 10 parts or 75+ percent  FIM - Upper Body Dressing/Undressing Upper body dressing/undressing steps patient completed: Thread/unthread right sleeve of pullover shirt/dresss;Thread/unthread left sleeve of pullover shirt/dress;Put head through opening of pull over shirt/dress;Pull shirt over trunk Upper body dressing/undressing: 5: Supervision: Safety issues/verbal cues FIM - Lower Body Dressing/Undressing Lower body dressing/undressing steps patient completed: Thread/unthread left pants leg;Thread/unthread right pants leg;Don/Doff right sock;Don/Doff left sock Lower body dressing/undressing: 2: Max-Patient completed 25-49% of tasks  FIM - Toileting Toileting steps completed by patient: Performs perineal hygiene;Adjust clothing prior to toileting Toileting Assistive Devices: Grab bar or rail for support Toileting: 3: Mod-Patient completed 2 of 3 steps  FIM - Radio producer Devices: Grab bars Toilet Transfers: 3-To toilet/BSC: Mod A (lift or lower assist);3-From toilet/BSC: Mod A (lift or lower assist)  FIM - Control and instrumentation engineer Devices:  Arm rests Bed/Chair Transfer: 0: Activity did  not occur  FIM - Locomotion: Wheelchair Distance: 100 Locomotion: Wheelchair: 0: Activity did not occur FIM - Locomotion: Ambulation Locomotion: Ambulation Assistive Devices:  (Therapist under pt RUE) Ambulation/Gait Assistance: 3: Mod assist Locomotion: Ambulation: 3: Travels 150 ft or more with moderate assistance (Pt: 50 - 74%)  Comprehension Comprehension Mode: Auditory Comprehension: 3-Understands basic 50 - 74% of the time/requires cueing 25 - 50%  of the time  Expression Expression Mode: Verbal Expression: 2-Expresses basic 25 - 49% of the time/requires cueing 50 - 75% of the time. Uses single words/gestures.  Social Interaction Social Interaction: 2-Interacts appropriately 25 - 49% of time - Needs frequent redirection.  Problem Solving Problem Solving: 2-Solves basic 25 - 49% of the time - needs direction more than half the time to initiate, plan or complete simple activities  Memory Memory: 1-Recognizes or recalls less than 25% of the time/requires cueing greater than 75% of the time   Medical Problem List and Plan:  Right temporal occipital metastatic tumor (SCLC)--  -XRT completed  -steroid taper--decadron--decrease to 4mg  q12 -CTX as outpt  -will check head CT today follow up mets 1. DVT Prophylaxis/Anticoagulation: Pharmaceutical: Lovenox  2. Pain Management: tylenol, hydrocodone  3. Mood: Resumed Lexapro.   4. Neuropsych: This patient is not capable of making decisions on her own behalf.   -attention has improved a bit with ritalin but still with profound impairments cognitively 5. FEN/malnutrition---D3 Nectars---   -eating 100% at present  -continue to push fluids   -water protocol---lytes improving 6. Sz prophylaxis---keppra  LOS (Days) [place] EVALUATION WAS PERFORMED  Meredith Staggers 12/23/2013 7:43 AM

## 2013-12-23 NOTE — Progress Notes (Signed)
Speech Language Pathology Weekly Progress and Session Note  Patient Details  Name: Katie [name] MRN: [id number] Date of Birth: 12/21/1950  Beginning of progress report period: December 16, 2013 End of progress report period: Dec 23, 2013  Today's Date: 12/23/2013 Time: 2637-8588 Time Calculation (min): 45 min  Short Term Goals: Week 2: SLP Short Term Goal 1 (Week 2): Patient will consume PO with Min level verbal cues for pacing and portion control. SLP Short Term Goal 1 - Progress (Week 2): Met SLP Short Term Goal 2 (Week 2): Patient will sustain attention to task for 1 minute with Max verbal cues for redirection. SLP Short Term Goal 2 - Progress (Week 2): Met SLP Short Term Goal 3 (Week 2): Patient will utilize external aids for orientation with Max assist.  SLP Short Term Goal 3 - Progress (Week 2): Not met SLP Short Term Goal 4 (Week 2): Patient will label 2 deficits with Max clinician cues.  SLP Short Term Goal 4 - Progress (Week 2): Not met SLP Short Term Goal 5 (Week 2): Patient will solve basic problems with Max assist. SLP Short Term Goal 5 - Progress (Week 2): Not met SLP Short Term Goal 6 (Week 2): Patient maintain topic of conversation for 2 turns with Mod clinician cues. SLP Short Term Goal 6 - Progress (Week 2): Met    New Short Term Goals: Week 3: SLP Short Term Goal 1 (Week 3): Patient will consume PO with supervision level verbal cues for pacing and portion control. SLP Short Term Goal 2 (Week 3): Patient will sustain attention to task for 1 minute with Mod verbal cues for redirection. SLP Short Term Goal 3 (Week 3): Patient will utilize external aids for orientation with Max assist.  SLP Short Term Goal 4 (Week 3): Patient will label 2 deficits with Max clinician cues.  SLP Short Term Goal 5 (Week 3): Patient will solve basic problems with Max assist. SLP Short Term Goal 6 (Week 3): Patient maintain topic of conversation for 2 turns with Min clinician cues.  Weekly  Progress Updates: Pt has made functional gains and has met 3 of 6 STG's this reporting period due to increased topic maintenance, attention to task and swallowing function.  Currently, pt is consuming Dys. 3 textures with nectar-thick liquids with minimal overt s/s of aspiration and requires Min multimodal cues for utilization of swallowing compensatory strategies. Pt is also tolerating the water protocol and continues to show intermittent s/s of aspiration with thin liquids via cup. Pt continues to require max-total multimodal cues for task initiation, attention with keeping her eyes open when performing tasks, problem solving, awareness, working memory and safety with functional and familiar tasks. The pt is also verbose with tangential language at times, however, pt' s overall verbosity and topic maintenance continues to improve. [name] family attends sessions daily and education is ongoing. Pt would benefit from continued skilled SLP intervention to maximize cognitive and swallowing function and overall functional independence prior to discharge home.   Intensity: Minumum of 1-2 x/day, 30 to 90 minutes Frequency: 5 out of 7 days Duration/Length of Stay: 01/01/14 Treatment/Interventions: Cognitive remediation/compensation;Cueing hierarchy;Dysphagia/aspiration precaution training;Environmental controls;Functional tasks;Internal/external aids;Patient/family education;Speech/Language facilitation;Therapeutic Activities   Daily Session Skilled Therapeutic Interventions: Skilled treatment session focused on dysphagia and cognitive goals. SLP facilitated session by providing Min A multimodal cues for utilization of small bites/sips and pacing with self-feeing during breakfast meal of Dys. 3 textures with nectar-thick liquids. Pt consumed meal with cough X 2, suspect due  to talking while oral cavity was full. Pt sustained attention to self-feeding for ~30 minutes with Min A verbal cues for redirection and  demonstrated decreased verbosity throughout the meal. Pt also required total A to utilize external memory aids for orientation to place and date and for intellectual awareness of physical and cognitive deficits. Family education ongoing. Continue with current plan of care.     FIM:  Comprehension Comprehension: 3-Understands basic 50 - 74% of the time/requires cueing 25 - 50%  of the time Expression Expression Mode: Verbal Expression: 3-Expresses basic 50 - 74% of the time/requires cueing 25 - 50% of the time. Needs to repeat parts of sentences. Social Interaction Social Interaction: 3-Interacts appropriately 50 - 74% of the time - May be physically or verbally inappropriate. Problem Solving Problem Solving: 2-Solves basic 25 - 49% of the time - needs direction more than half the time to initiate, plan or complete simple activities Memory Memory: 1-Recognizes or recalls less than 25% of the time/requires cueing greater than 75% of the time FIM - Eating Eating Activity: 5: Supervision/cues Pain Pain Assessment Pain Assessment: No/denies pain  Therapy/Group: Individual Therapy  Buzzy Han 12/23/2013, 8:45 AM

## 2013-12-24 ENCOUNTER — Inpatient Hospital Stay (HOSPITAL_COMMUNITY): Payer: BC Managed Care – PPO | Admitting: Speech Pathology

## 2013-12-24 ENCOUNTER — Inpatient Hospital Stay (HOSPITAL_COMMUNITY): Payer: BC Managed Care – PPO

## 2013-12-24 ENCOUNTER — Encounter (HOSPITAL_COMMUNITY): Payer: BC Managed Care – PPO

## 2013-12-24 LAB — GLUCOSE, CAPILLARY
GLUCOSE-CAPILLARY: 108 mg/dL — AB (ref 70–99)
GLUCOSE-CAPILLARY: 145 mg/dL — AB (ref 70–99)
Glucose-Capillary: 114 mg/dL — ABNORMAL HIGH (ref 70–99)
Glucose-Capillary: 123 mg/dL — ABNORMAL HIGH (ref 70–99)
Glucose-Capillary: 137 mg/dL — ABNORMAL HIGH (ref 70–99)
Glucose-Capillary: 88 mg/dL (ref 70–99)

## 2013-12-24 NOTE — Progress Notes (Signed)
Occupational Therapy Weekly Progress Note  Patient Details  Name: Katie [name] MRN: [id number] Date of Birth: 05/24/1951  Beginning of progress report period: December 16, 2013 End of progress report period: Dec 24, 2013  Today's Date: 12/24/2013  Patient has met 1 of 4 short term goals.  Pt progress has been slow and inconsistent during the past week.  Pt has exhibited gains in dressing tasks, task initiation, sequencing, and attention to task.  Pt continues to require mod verbal cues for sequencing with sit<>stand and min/mod verbal cues for task initiation for BADLs. Pt required mod A/max A for sit<>stand during bathing and dressing tasks. [name] husband, niece, and sister have been present for therapy sessions and have begun family education.  Patient continues to demonstrate the following deficits: muscle weakness, decreased cardiorespitatory and functional endurance, decreased coordination, decreased visual perceptual skills and inattention to the left, decreased midline orientation and decreased attention to left, decreased initiation, decreased sustained attention, decreased awareness, decreased problem solving, decreased safety awareness, decreased memory and delayed processing and decreased standing balance, decreased postural control, decreased balance strategies and difficulty maintaining precautions Therefore, patient will continue to benefit from skilled OT intervention to enhance overall performance with iADL and Reduce care partner burden.  Patient not progressing toward long term goals.  See goal revision to include 5 LTGs downgraded to min-mod assist.  OT Short Term Goals Week 2:  OT Short Term Goal 1 (Week 2): Pt will complete toileting with max A OT Short Term Goal 1 - Progress (Week 2): Progressing toward goal OT Short Term Goal 2 (Week 2): Pt will complete lower body dressing with max A OT Short Term Goal 2 - Progress (Week 2): Met OT Short Term Goal 3 (Week 2): Pt will perform  toilet transfer with min A OT Short Term Goal 3 - Progress (Week 2): Revised due to lack of progress OT Short Term Goal 4 (Week 2): Pt will perform shower transfer with mod A OT Short Term Goal 4 - Progress (Week 2): Progressing toward goal  Therapy Documentation Precautions:  Precautions Precautions: Fall Precaution Comments: impulsive, decreased safety awareness Restrictions Weight Bearing Restrictions: No Pain: Pain Assessment Pain Assessment: No/denies pain ADL: See FIM  Leroy Libman 12/24/2013, 3:02 PM

## 2013-12-24 NOTE — Progress Notes (Signed)
Speech Language Pathology Daily Session Note  Patient Details  Name: Katie [name] MRN: [id number] Date of Birth: 1951-04-03  Today's Date: 12/24/2013 Time: 0800-0845 Time Calculation (min): 45 min  Short Term Goals: Week 3: SLP Short Term Goal 1 (Week 3): Patient will consume PO with supervision level verbal cues for pacing and portion control. SLP Short Term Goal 2 (Week 3): Patient will sustain attention to task for 1 minute with Mod verbal cues for redirection. SLP Short Term Goal 3 (Week 3): Patient will utilize external aids for orientation with Max assist.  SLP Short Term Goal 4 (Week 3): Patient will label 2 deficits with Max clinician cues.  SLP Short Term Goal 5 (Week 3): Patient will solve basic problems with Max assist. SLP Short Term Goal 6 (Week 3): Patient maintain topic of conversation for 2 turns with Min clinician cues.  Skilled Therapeutic Interventions: Skilled treatment session focused on cognitive goals. SLP facilitated session by providing Min A multimodal cues for utilization of swallowing compensatory strategies with breakfast meal of Dys. 3 textures with nectar-thick liquids. Pt requested to use the bathroom and required total A multimodal cues for transfer from bed to wheelchair and from wheelchair to commode. Pt demonstrated decreased verbosity and increased sustained attention throughout all self-care tasks. [name] niece present and educated on appropriate cueing for patient to increase success with functional tasks. Continue with current plan of care.    FIM:  Comprehension Comprehension Mode: Auditory;Visual Comprehension: 3-Understands basic 50 - 74% of the time/requires cueing 25 - 50%  of the time Expression Expression Mode: Verbal Expression: 2-Expresses basic 25 - 49% of the time/requires cueing 50 - 75% of the time. Uses single words/gestures. Social Interaction Social Interaction: 3-Interacts appropriately 50 - 74% of the time - May be physically or  verbally inappropriate. Problem Solving Problem Solving: 1-Solves basic less than 25% of the time - needs direction nearly all the time or does not effectively solve problems and may need a restraint for safety Memory Memory: 1-Recognizes or recalls less than 25% of the time/requires cueing greater than 75% of the time FIM - Eating Eating Activity: 5: Supervision/cues;5: Set-up assist for open containers;5: Set-up assist for cut food;5: Needs verbal cues/supervision  Pain Pain Assessment Pain Assessment: No/denies pain    Therapy/Group: Individual Therapy  Buzzy Han 12/24/2013, 11:45 AM

## 2013-12-24 NOTE — Progress Notes (Signed)
Physical Therapy Session Note  Patient Details  Name: Katie [name] MRN: [id number] Date of Birth: 1951/06/07  Today's Date: 12/24/2013 Time: 1100-1200 Time Calculation (min): 60 min  Short Term Goals: Week 2:  PT Short Term Goal 1 (Week 2): Pt to perform bed mobility with min Ax1person PT Short Term Goal 2 (Week 2): Pt to perform bed<>w/c tranfsers w/ mod Ax1person 75% of time PT Short Term Goal 3 (Week 2): Pt will ambulate 100' w/ mod A x1person 50% of time PT Short Term Goal 4 (Week 2): Pt will negotiate up/down 2 steps w/ max A x1person PT Short Term Goal 4 - Progress (Week 2): Discontinued (comment) (education regarding need/benefit of ramp) PT Short Term Goal 5 (Week 2): Pt will propel w/c x100' w/ B LE and min Ax1person 50% of time  Skilled Therapeutic Interventions/Progress Updates:  1:1. Pt received sitting in w/c, ready for therapy. Focus this session on ambulation, functional transfers and initiation of family training with pt's niece. Pt able to amb 225' w/ max A x1person, therapist facilitating trunk flexion for improved alignment over BOS. Pt continues to demonstrate R knee hyperextension w/ use of AFO and heel wedge, will continue to assess. Pt req min cues to quietly attend to task, intermittently demonstrating scissoring of B LE. Pt practiced multiple t/f bed<>w/c<>tx mat as well as t/f sit<>stand w/ mod A and mod-max multimodal cues. Following demonstration by therapist, pt's niece assisting pt with successful performance of transfers. Education provided regarding appropriate cueing as well as anticipated DME needs at d/c including hospital bed and w/c.  Emphasis on importance of consistency of verbal/tactile cues, schedule, set up etc. Upon d/c home for increased success. Pt sitting in w/c at end of session w/ all needs in reach, quick release belt in place and niece in room.   Therapy Documentation Precautions:  Precautions Precautions: Fall Precaution Comments: impulsive,  decreased safety awareness Restrictions Weight Bearing Restrictions: No  See FIM for current functional status  Therapy/Group: Individual Therapy  Gilmore Laroche 12/24/2013, 12:16 PM

## 2013-12-24 NOTE — Progress Notes (Signed)
I saw her this AM. She just had a CT scan. 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  No new disease.   She still has the functional deficits due to the CNS disease. She is very pleasant.  She is trying to do PT/OT but i am not sure that she has the mental capabilities to understand.  She has some aphasia.  Physically, she is pretty strong.  Her last labs loooked ok.  I would repeat these in 1-2 days.  As far as chemotherapy is concerned, i would consider starting it in 2 weeks. i do not think that her systemic disease is progressing that much, if at all.  She is asymptomatic with her systemic disease.  i assume she will be home or in an outpatient facility in the very near future.  If her family wishes for her to have chemotherapy close to home, we can arrange for the MDs at [facility] to take over.    Just let me know when she will be discharged.  i thank ALL the staff at [facility] for their OUTSTANDING care!!!!  Pete E.  Romans 8:28

## 2013-12-24 NOTE — Progress Notes (Signed)
Occupational Therapy Session Note  Patient Details  Name: Katie [name] MRN: [id number] Date of Birth: [birth year]  Today's Date: 12/24/2013  Session 1 Time: 0930-1025 Time Calculation (min): 55 min  Short Term Goals: Week 2:  OT Short Term Goal 1 (Week 2): Pt will complete toileting with max A OT Short Term Goal 2 (Week 2): Pt will complete lower body dressing with max A OT Short Term Goal 3 (Week 2): Pt will perform toilet transfer with min A OT Short Term Goal 4 (Week 2): Pt will perform shower transfer with mod A  Skilled Therapeutic Interventions/Progress Updates:    Pt engaged in ADL retraining including bathing at shower level and dressing with sit<>stand from w/c at sink.  [name] niece present for family education and observed as well as provided verbal cues throughout the session.  [name] niece educated on appropriate cueing and allowing patient to attempt to initiate and complete tasks before providing assistance.  Pt required mod verbal cues for task initiation during bathing and dressing tasks.  Pt required mod verbal cues for redirection to task and to open eyes to attend to tasks.  Pt continues to required mod/max A for sit<>stand and BLE/BUE placement prior to transitional movements. Focus on family education, activity tolerance, sit<>stand, standing balance, task initiation, sequencing, and attention to task.  Therapy Documentation Precautions:  Precautions Precautions: Fall Precaution Comments: impulsive, decreased safety awareness Restrictions Weight Bearing Restrictions: No    Pain: Pain Assessment Pain Assessment: No/denies pain Pain Score: 0-No pain  See FIM for current functional status  Therapy/Group: Individual Therapy  Session 2 Time: 6226-3335 Pt denies pain Individual Therapy  Pt resting in bed with husband at side upon arrival.  Husband actively participated in transfer training in tub room for w/c<>tub bench transfers.  Discussed bathroom setup and  pt's husband stated that w/c will fit into bathroom and they already own a tub bench from when they provided care for pt's wife. Pt required mod A for squat pivot transfer w/c<>tub bench. Focus on activity tolerance, transfers, family education, and safety awareness.  Leroy Libman 12/24/2013, 10:29 AM

## 2013-12-24 NOTE — Progress Notes (Signed)
[place] PHYSICAL MEDICINE & REHABILITATION     PROGRESS NOTE    Subjective/Complaints: No new issues. Already back from CT. Slow progress A 12 point review of systems has been performed and if not noted above is otherwise negative.  Ros limited due to cognition/language.  Objective: Vital Signs: Blood pressure 107/64, pulse 66, temperature 97.6 F (36.4 C), temperature source Oral, resp. rate 18, height 5\' 7"  (1.702 m), weight 59.2 kg (130 lb 8.2 oz), SpO2 98.00%. Ct Head Wo Contrast  12/24/2013   CLINICAL DATA:  Right craniotomy for probable metastatic tumor section, confusion.  EXAM: CT HEAD WITHOUT CONTRAST  TECHNIQUE: Contiguous axial images were obtained from the base of the skull through the vertex without intravenous contrast.  COMPARISON:  CT HEAD W/O CM dated 11/27/2013; CT HEAD W/O CM dated 11/23/2013; MR HEAD WO/W CM dated 11/11/2013  FINDINGS: Moderately motion degraded examination.  14 x 15 mm right frontal dense mass, measuring smaller though the difference could be technical. The probable hemorrhage along the inferior component on prior examination not seen on this motion degraded examination. Slightly decreased surrounding vasogenic edema.  Left mesial occipital lobe encephalomalacia with mild ex vacuo dilatation of the left occipital horn, unchanged. Interval increase in size of ventricles, with mild ventriculomegaly seen on today's examination.  Frontotemporal occipital craniotomy, with underlying cystic resection cavity, no extra-axial fluid collections on today's examination. No midline shift. No definite acute large vascular territory infarct.  Basal cisterns are patent. No skull fracture. Limited assessment of the skullbase, no definite paranasal sinus air-fluid levels. The mastoid air cells appear well-aerated.  IMPRESSION: Moderately motion degraded examination.  Status post right craniotomy with underlying cystic resection cavity and posttreatment changes. Right frontal 15 x 14  mm dense metastasis appears smaller, with slightly decreased surrounding vasogenic edema.  Increased ventricle size from prior examination, it is unclear what this reflects overall reduction in mass-effect or, treatment related changes/possibly hydrocephalus.  Left medial occipital lobe encephalomalacia with propagation from prior examination could reflect subacute left posterior cerebral artery territory infarct.   Electronically Signed   By: Elon Alas   On: 12/24/2013 06:34   No results found for this basename: WBC, HGB, HCT, PLT,  in the last 72 hours No results found for this basename: NA, K, CL, CO, GLUCOSE, BUN, CREATININE, CALCIUM,  in the last 72 hours CBG (last 3)   Recent Labs  12/24/13 0002 12/24/13 0419 12/24/13 0744  GLUCAP 137* 123* 108*    Wt Readings from Last 3 Encounters:  12/18/13 59.2 kg (130 lb 8.2 oz)  12/03/13 54.4 kg (119 lb 14.9 oz)  12/03/13 54.4 kg (119 lb 14.9 oz)    Physical Exam:  Constitutional: No distress.  Frail appearing, alert  HENT:  Head: Normocephalic and atraumatic.  Right Ear: External ear normal.  Left Ear: External ear normal.  Eyes: Conjunctivae and EOM are normal. Pupils are equal, round, and reactive to light.  Neck: No JVD present. No tracheal deviation present. No thyromegaly present.  Cardiovascular: Normal rate and regular rhythm. Exam reveals friction rub. Exam reveals no gallop.  No murmur heard.  Respiratory: No respiratory distress. She has no wheezes. She has no rales. She exhibits no tenderness.  GI: She exhibits no distension.Soft  There is no tenderness. There is no rebound.  Lymphadenopathy:  She has no cervical adenopathy.  Neurological:  Attention waxes and wanes stil.    Language of confusion persistent. Typically keeps eyes closed unless cued. moves all 4 limbs. Senses  pain.  left HH and likely ?diplopia. Impaired depth perception/color? Skin: Skin is warm. Op site intact with some fluid still under skin---no  real change Psychiatric:   confused   Assessment/Plan: 1. 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 Physiatrist is providing close team supervision and 24 hour management of active medical problems listed below. Physiatrist and rehab team continue to assess barriers to discharge/monitor patient progress toward functional and medical goals.  3 hrs of therapy per day---pace/spread out sessions.  Will need to look at changing gears and family ed     FIM: FIM - Bathing Bathing Steps Patient Completed: Chest;Right Arm;Left Arm;Abdomen;Front perineal area;Right upper leg;Left upper leg Bathing: 3: Mod-Patient completes 5-7 5f 10 parts or 50-74%  FIM - Upper Body Dressing/Undressing Upper body dressing/undressing steps patient completed: Thread/unthread right sleeve of pullover shirt/dresss;Thread/unthread left sleeve of pullover shirt/dress;Put head through opening of pull over shirt/dress;Pull shirt over trunk Upper body dressing/undressing: 5: Supervision: Safety issues/verbal cues FIM - Lower Body Dressing/Undressing Lower body dressing/undressing steps patient completed: Thread/unthread left pants leg;Thread/unthread right pants leg;Don/Doff right shoe (attempted to tie left shoe) Lower body dressing/undressing: 2: Max-Patient completed 25-49% of tasks  FIM - Toileting Toileting steps completed by patient: Performs perineal hygiene;Adjust clothing prior to toileting Toileting Assistive Devices: Grab bar or rail for support Toileting: 3: Mod-Patient completed 2 of 3 steps  FIM - Radio producer Devices: Grab bars Toilet Transfers: 4-To toilet/BSC: Min A (steadying Pt. > 75%);4-From toilet/BSC: Min A (steadying Pt. > 75%)  FIM - Bed/Chair Transfer Bed/Chair Transfer Assistive Devices: Arm rests Bed/Chair Transfer: 4: Supine > Sit:  Min A (steadying Pt. > 75%/lift 1 leg);4: Sit > Supine: Min A (steadying pt. > 75%/lift 1 leg);2: Chair or W/C > Bed: Max A (lift and lower assist);2: Bed > Chair or W/C: Max A (lift and lower assist)  FIM - Locomotion: Wheelchair Distance: 100 Locomotion: Wheelchair: 4: Travels 150 ft or more: maneuvers on rugs and over door sillls with minimal assistance (Pt.>75%) FIM - Locomotion: Ambulation Locomotion: Ambulation Assistive Devices: Other (comment) (3 musketeers) Ambulation/Gait Assistance: 1: +2 Total assist Locomotion: Ambulation: 1: Two helpers  Comprehension Comprehension Mode: Auditory Comprehension: 3-Understands basic 50 - 74% of the time/requires cueing 25 - 50%  of the time  Expression Expression Mode: Verbal Expression: 3-Expresses basic 50 - 74% of the time/requires cueing 25 - 50% of the time. Needs to repeat parts of sentences.  Social Interaction Social Interaction: 3-Interacts appropriately 50 - 74% of the time - May be physically or verbally inappropriate.  Problem Solving Problem Solving: 2-Solves basic 25 - 49% of the time - needs direction more than half the time to initiate, plan or complete simple activities  Memory Memory: 1-Recognizes or recalls less than 25% of the time/requires cueing greater than 75% of the time   Medical Problem List and Plan:  Right temporal occipital metastatic tumor (SCLC)--  -XRT completed  -steroid taper--decadron--decrease to 4mg  q12---probably will keep her on this dose for the time being -CTX as outpt --pt would like treatment to be in [place] -head CT with improvement of edema around tumor. Some increase in ventricle size 1. DVT Prophylaxis/Anticoagulation: Pharmaceutical: Lovenox  2. Pain Management: tylenol, hydrocodone  3. Mood: Resumed Lexapro.   4. Neuropsych: This patient is not capable of making decisions on her own behalf.   -attention has improved a bit with  ritalin but still with profound impairments  cognitively 5. FEN/malnutrition---D3 Nectars---   -eating 100% at present  -continue to push fluids   -water protocol---lytes improving--recheck tomorrow 6. Sz prophylaxis---keppra  LOS (Days) 18 A FACE TO FACE EVALUATION WAS PERFORMED  Meredith Staggers 12/24/2013 8:04 AM

## 2013-12-24 NOTE — Progress Notes (Signed)
Orthopedic Tech Progress Note Patient Details:  Katie [name] 1951/02/03 [id number] Spoke with [facility] to place brace order Patient ID: Iva Boop, female   DOB: Dec 08, 1950, 63 y.o.   MRN: [id number]   [place]enton Foy 12/24/2013, 2:48 PM

## 2013-12-25 ENCOUNTER — Inpatient Hospital Stay (HOSPITAL_COMMUNITY): Payer: BC Managed Care – PPO

## 2013-12-25 ENCOUNTER — Inpatient Hospital Stay (HOSPITAL_COMMUNITY): Payer: BC Managed Care – PPO | Admitting: Speech Pathology

## 2013-12-25 ENCOUNTER — Telehealth: Payer: Self-pay | Admitting: *Deleted

## 2013-12-25 ENCOUNTER — Encounter (HOSPITAL_COMMUNITY): Payer: BC Managed Care – PPO

## 2013-12-25 DIAGNOSIS — R4182 Altered mental status, unspecified: Secondary | ICD-10-CM

## 2013-12-25 DIAGNOSIS — C7949 Secondary malignant neoplasm of other parts of nervous system: Secondary | ICD-10-CM

## 2013-12-25 DIAGNOSIS — C7931 Secondary malignant neoplasm of brain: Secondary | ICD-10-CM

## 2013-12-25 DIAGNOSIS — F341 Dysthymic disorder: Secondary | ICD-10-CM

## 2013-12-25 DIAGNOSIS — I10 Essential (primary) hypertension: Secondary | ICD-10-CM

## 2013-12-25 LAB — COMPREHENSIVE METABOLIC PANEL
ALT: 59 U/L — AB (ref 0–35)
AST: 27 U/L (ref 0–37)
Albumin: 2.3 g/dL — ABNORMAL LOW (ref 3.5–5.2)
Alkaline Phosphatase: 18 U/L — ABNORMAL LOW (ref 39–117)
BILIRUBIN TOTAL: 0.7 mg/dL (ref 0.3–1.2)
BUN: 21 mg/dL (ref 6–23)
CHLORIDE: 94 meq/L — AB (ref 96–112)
CO2: 27 mEq/L (ref 19–32)
Calcium: 8.4 mg/dL (ref 8.4–10.5)
Creatinine, Ser: 0.4 mg/dL — ABNORMAL LOW (ref 0.50–1.10)
GFR calc non Af Amer: >90 mL/min (ref >90)
GLUCOSE: 104 mg/dL — AB (ref 70–99)
Potassium: 4.4 mEq/L (ref 3.7–5.3)
SODIUM: 133 meq/L — AB (ref 137–147)
TOTAL PROTEIN: 4.5 g/dL — AB (ref 6.0–8.3)

## 2013-12-25 LAB — GLUCOSE, CAPILLARY
GLUCOSE-CAPILLARY: 105 mg/dL — AB (ref 70–99)
GLUCOSE-CAPILLARY: 120 mg/dL — AB (ref 70–99)
GLUCOSE-CAPILLARY: 151 mg/dL — AB (ref 70–99)
Glucose-Capillary: 105 mg/dL — ABNORMAL HIGH (ref 70–99)
Glucose-Capillary: 127 mg/dL — ABNORMAL HIGH (ref 70–99)
Glucose-Capillary: 154 mg/dL — ABNORMAL HIGH (ref 70–99)

## 2013-12-25 LAB — CBC
HEMATOCRIT: 33.2 % — AB (ref 36.0–46.0)
HEMOGLOBIN: 11.2 g/dL — AB (ref 12.0–15.0)
MCH: 32.3 pg (ref 26.0–34.0)
MCHC: 33.7 g/dL (ref 30.0–36.0)
MCV: 95.7 fL (ref 78.0–100.0)
Platelets: 136 10*3/uL — ABNORMAL LOW (ref 150–400)
RBC: 3.47 MIL/uL — AB (ref 3.87–5.11)
RDW: 15.2 % (ref 11.5–15.5)
WBC: 7.8 10*3/uL (ref 4.0–10.5)

## 2013-12-25 LAB — PREALBUMIN: PREALBUMIN: 32.2 mg/dL (ref 17.0–34.0)

## 2013-12-25 LAB — LACTATE DEHYDROGENASE: LDH: 393 U/L — ABNORMAL HIGH (ref 94–250)

## 2013-12-25 NOTE — Telephone Encounter (Signed)
Received call from [place].  She is inquiring whether patient can receive chemotherapy at [facility]  Dr. Marin Olp saw patient in hospital.  Patient cannot receive chemo at [place] if Dr. Marin Olp was her oncologist. However, Dr. Dicie Beam intent was to refer her to an oncologist at [facility].  He will arrange this in the hospital for patient

## 2013-12-25 NOTE — Progress Notes (Signed)
Physical Therapy Weekly Progress Note  Patient Details  Name: Katie [name] MRN: [id number] Date of Birth: April 18, 1951  Beginning of progress report period: December 17, 2013 End of progress report period: Dec 24, 2013  Today's Date: 12/25/2013 Time: Treatment Session 1: 1030-1130; Treatment Session 2: 1300-1330 Time Calculation (min): Treatment Session 1: 60 min; Treatment Session 2: 48mn  Pt continues to make slow and inconsistent progress, but has recently achieved 4/4 STGs. With optimized multimodal cues, pt able to perform bed mobility and w/c propulsion w/ min A, functional transfers w/ mod A, sit<>stand transfer and ambulation with mod-max A x1person. Without appropriate cueing, pt can req max-total A x1person due to decreased sustained attention, initiation and motor planning. [name] husband, sister and niece have been present for therapy sessions and family education/training has been initiated.   Patient continues to demonstrate the following deficits: muscle weakness, decreased cardiorespitatory and functional endurance, decreased coordination, decreased visual perceptual skills and inattention to the left, decreased midline orientation and decreased attention to left, decreased initiation, decreased sustained attention, decreased awareness, decreased motor planning, decreased problem solving, decreased safety awareness, decreased memory and delayed processing and decreased standing balance, decreased postural control, decreased balance strategies and difficulty maintaining precautions and therefore will continue to benefit from skilled PT intervention to enhance overall performance with activity tolerance, balance, postural control, attention, awareness and coordination.  Patient progressing toward long term goals.  Continue plan of care.  PT Short Term Goals Week 1:  PT Short Term Goal 1 (Week 1): Pt will increase bed mobility to mod A.  PT Short Term Goal 1 - Progress (Week 1): Met PT  Short Term Goal 2 (Week 1): Pt will increase transfer bed to chair, chair to bed to mod A.  PT Short Term Goal 2 - Progress (Week 1): Partly met PT Short Term Goal 3 (Week 1): Pt will increase ambulation with LRAD to mod A about 25 feet.  PT Short Term Goal 3 - Progress (Week 1): Partly met PT Short Term Goal 4 (Week 1): Pt will ascend/descend 2 stairs with B rails and max A.  PT Short Term Goal 4 - Progress (Week 1): Partly met PT Short Term Goal 5 (Week 1): Pt will propel w/c about 100 feet with B LEs and min A.  PT Short Term Goal 5 - Progress (Week 1): Partly met Week 2:  PT Short Term Goal 1 (Week 2): Pt to perform bed mobility with min Ax1person PT Short Term Goal 1 - Progress (Week 2): Met PT Short Term Goal 2 (Week 2): Pt to perform bed<>w/c tranfsers w/ mod Ax1person 75% of time PT Short Term Goal 2 - Progress (Week 2): Met PT Short Term Goal 3 (Week 2): Pt will ambulate 100' w/ mod A x1person 50% of time PT Short Term Goal 3 - Progress (Week 2): Met PT Short Term Goal 4 (Week 2): Pt will negotiate up/down 2 steps w/ max A x1person PT Short Term Goal 4 - Progress (Week 2): Discontinued (comment) (education regarding need/benefit of ramp) PT Short Term Goal 5 (Week 2): Pt will propel w/c x100' w/ B LE and min Ax1person 50% of time PT Short Term Goal 5 - Progress (Week 2): Met Week 3:  PT Short Term Goal 1 (Week 3): STGs=LTGs due to anticipated LOS  Skilled Therapeutic Interventions/Progress Updates:  Treatment Session 1:  1:1. Pt received sitting in w/c, ready for therapy. Pt noted to be more alert with improved sustained attention and ability  to follow simple commands this session. Focus this session on transfer training with pt's sister. Following demonstration by therapist, pt's sister physically assisting pt with t/f sup<>sit, bed<>w/c transfer, t/f sit<>stand and SPT bed>w/c. Pt req max multimodal cueing overall for sustained attention, initiation and motor planning. Education  provided regarding importance of consistency with schedule and multimodal cueing. Pt amb 100' and 47' w/ mod-max A from therapist this session, orthotist present to assess for appropriate R LE AFO to address knee hyperextension. Pt req min A for w/c propulsion 50'x1. Pt sitting in w/c at end of session w/ all needs in reach, quick release belt in place and sister in room.  Treatment Session 2:  1:1. Pt received sitting in w/c, ready for therapy. Continued focus this session on family education and d/c planning with pt's sister and niece. Family educated on appropriate fit in shoe as well as donning/doffing R AFO. Family stating that they have a w/c at home, recommended that they bring it in for therapist to assess if appropriate for pt. Therapist performing w/c<>bed t/f with pt set to 29" as this is the height of pt's bed at home. Pt unable to safely sit on edge due to height requiring therapist to block pt. Pt with increased safety when bed positioned at lower height of 23", encouraged that they utilize this bed set up for improved safety/success of transfers. Pt req mod A overall for squat pivot t/f bed<>w/c, therapist emphasizing appropriate multimodal cues. Pt sitting in w/c at end of session w/ quick release belt in place, all needs in reach.   Therapy Documentation Precautions:  Precautions Precautions: Fall Precaution Comments: impulsive, decreased safety awareness Restrictions Weight Bearing Restrictions: No  See FIM for current functional status  Therapy/Group: Individual Therapy  Gilmore Laroche 12/25/2013, 12:27 PM

## 2013-12-25 NOTE — Progress Notes (Signed)
Social Work Patient ID: Katie [name], female   DOB: 10-May-1951, 63 y.o.   MRN: [id number]  Met with pt and husband yesterday following team conference.  Both aware and agreeable with change in d/c to 12/28/13.  Husband agrees some improvement this week in her overall attention.  Family education continues.  Provided husband with information on local ramp rental agencies.  He is taking today "off" to get things ready at home.  Continue to follow for d/c planning needs and support.  Lennart Pall, LCSW

## 2013-12-25 NOTE — Progress Notes (Signed)
[place] PHYSICAL MEDICINE & REHABILITATION     PROGRESS NOTE    Subjective/Complaints: No changes. Family in room A 12 point review of systems has been performed and if not noted above is otherwise negative.  Ros limited due to cognition/language.  Objective: Vital Signs: Blood pressure 128/75, pulse 70, temperature 97.6 F (36.4 C), temperature source Oral, resp. rate 18, height 5\' 7"  (1.702 m), weight 59.2 kg (130 lb 8.2 oz), SpO2 99.00%. Ct Head Wo Contrast  12/24/2013   CLINICAL DATA:  Right craniotomy for probable metastatic tumor section, confusion.  EXAM: CT HEAD WITHOUT CONTRAST  TECHNIQUE: Contiguous axial images were obtained from the base of the skull through the vertex without intravenous contrast.  COMPARISON:  CT HEAD W/O CM dated 11/27/2013; CT HEAD W/O CM dated 11/23/2013; MR HEAD WO/W CM dated 11/11/2013  FINDINGS: Moderately motion degraded examination.  14 x 15 mm right frontal dense mass, measuring smaller though the difference could be technical. The probable hemorrhage along the inferior component on prior examination not seen on this motion degraded examination. Slightly decreased surrounding vasogenic edema.  Left mesial occipital lobe encephalomalacia with mild ex vacuo dilatation of the left occipital horn, unchanged. Interval increase in size of ventricles, with mild ventriculomegaly seen on today's examination.  Frontotemporal occipital craniotomy, with underlying cystic resection cavity, no extra-axial fluid collections on today's examination. No midline shift. No definite acute large vascular territory infarct.  Basal cisterns are patent. No skull fracture. Limited assessment of the skullbase, no definite paranasal sinus air-fluid levels. The mastoid air cells appear well-aerated.  IMPRESSION: Moderately motion degraded examination.  Status post right craniotomy with underlying cystic resection cavity and posttreatment changes. Right frontal 15 x 14 mm dense metastasis  appears smaller, with slightly decreased surrounding vasogenic edema.  Increased ventricle size from prior examination, it is unclear what this reflects overall reduction in mass-effect or, treatment related changes/possibly hydrocephalus.  Left medial occipital lobe encephalomalacia with propagation from prior examination could reflect subacute left posterior cerebral artery territory infarct.   Electronically Signed   By: Elon Alas   On: 12/24/2013 06:34    Recent Labs  12/25/13 0505  WBC 7.8  HGB 11.2*  HCT 33.2*  PLT 136*    Recent Labs  12/25/13 0505  NA 133*  K 4.4  CL 94*  GLUCOSE 104*  BUN 21  CREATININE 0.40*  CALCIUM 8.4   CBG (last 3)   Recent Labs  12/25/13 0011 12/25/13 0411 12/25/13 0725  GLUCAP 127* 105* 120*    Wt Readings from Last 3 Encounters:  12/18/13 59.2 kg (130 lb 8.2 oz)  12/03/13 54.4 kg (119 lb 14.9 oz)  12/03/13 54.4 kg (119 lb 14.9 oz)    Physical Exam:  Constitutional: No distress.  Frail appearing, alert  HENT:  Head: Normocephalic and atraumatic.  Right Ear: External ear normal.  Left Ear: External ear normal.  Eyes: Conjunctivae and EOM are normal. Pupils are equal, round, and reactive to light.  Neck: No JVD present. No tracheal deviation present. No thyromegaly present.  Cardiovascular: Normal rate and regular rhythm. Exam reveals friction rub. Exam reveals no gallop.  No murmur heard.  Respiratory: No respiratory distress. She has no wheezes. She has no rales. She exhibits no tenderness.  GI: She exhibits no distension.Soft  There is no tenderness. There is no rebound.  Lymphadenopathy:  She has no cervical adenopathy.  Neurological:  Attention waxes and wanes stil.    Language of confusion persistent. Typically keeps  eyes closed unless cued. moves all 4 limbs. Senses pain.  left HH and likely ?diplopia. Impaired depth perception/color? Skin: Skin is warm. Op site intact with some fluid still under skin---no real  change Psychiatric:   confused   Assessment/Plan: 1. 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 Physiatrist is providing close team supervision and 24 hour management of active medical problems listed below. Physiatrist and rehab team continue to assess barriers to discharge/monitor patient progress toward functional and medical goals.  Finalizing dc planning for Saturday dc    FIM: FIM - Bathing Bathing Steps Patient Completed: Chest;Right Arm;Left Arm;Abdomen;Front perineal area;Right upper leg;Left upper leg;Right lower leg (including foot);Left lower leg (including foot) Bathing: 4: Min-Patient completes 8-9 38f 10 parts or 75+ percent  FIM - Upper Body Dressing/Undressing Upper body dressing/undressing steps patient completed: Thread/unthread right sleeve of pullover shirt/dresss;Thread/unthread left sleeve of pullover shirt/dress;Put head through opening of pull over shirt/dress;Pull shirt over trunk Upper body dressing/undressing: 5: Supervision: Safety issues/verbal cues FIM - Lower Body Dressing/Undressing Lower body dressing/undressing steps patient completed: Thread/unthread right pants leg;Thread/unthread left pants leg;Pull pants up/down;Don/Doff left sock;Don/Doff right sock Lower body dressing/undressing: 3: Mod-Patient completed 50-74% of tasks  FIM - Toileting Toileting steps completed by patient: Performs perineal hygiene Toileting Assistive Devices: Grab bar or rail for support Toileting: 2: Max-Patient completed 1 of 3 steps  FIM - Radio producer Devices: Grab bars Toilet Transfers: 3-To toilet/BSC: Mod A (lift or lower assist);3-From toilet/BSC: Mod A (lift or lower assist)  FIM - Bed/Chair Transfer Bed/Chair Transfer Assistive Devices: Arm rests Bed/Chair Transfer: 4: Supine > Sit: Min A (steadying Pt. >  75%/lift 1 leg);3: Bed > Chair or W/C: Mod A (lift or lower assist);3: Chair or W/C > Bed: Mod A (lift or lower assist);4: Sit > Supine: Min A (steadying pt. > 75%/lift 1 leg)  FIM - Locomotion: Wheelchair Distance: 100 Locomotion: Wheelchair: 1: Total Assistance/staff pushes wheelchair (Pt<25%) FIM - Locomotion: Ambulation Locomotion: Ambulation Assistive Devices: Other (comment) (L UE over therapists shoulder) Ambulation/Gait Assistance: 2: Max assist Locomotion: Ambulation: 2: Travels 150 ft or more with maximal assistance (Pt: 25 - 49%)  Comprehension Comprehension Mode: Auditory;Visual Comprehension: 3-Understands basic 50 - 74% of the time/requires cueing 25 - 50%  of the time  Expression Expression Mode: Verbal Expression: 2-Expresses basic 25 - 49% of the time/requires cueing 50 - 75% of the time. Uses single words/gestures.  Social Interaction Social Interaction: 3-Interacts appropriately 50 - 74% of the time - May be physically or verbally inappropriate.  Problem Solving Problem Solving: 1-Solves basic less than 25% of the time - needs direction nearly all the time or does not effectively solve problems and may need a restraint for safety  Memory Memory: 1-Recognizes or recalls less than 25% of the time/requires cueing greater than 75% of the time   Medical Problem List and Plan:  Right temporal occipital metastatic tumor (SCLC)--  -XRT completed  -steroid taper--decadron--decrease to 4mg  q12---probably will keep her on this dose for the time being -CTX as outpt --pt would like treatment to be in [place].. Dr. Marin Olp stated that he could arrange for pt to have CTX at [facility]  -head CT with improvement of edema around tumor. Some increase in ventricle size -CBC stable today (watch plts) 1. DVT Prophylaxis/Anticoagulation: Pharmaceutical: Lovenox  2. Pain Management: tylenol, hydrocodone  3. Mood: Resumed Lexapro.  4. Neuropsych: This patient is not capable of making  decisions on her own behalf.   -attention has improved a bit with ritalin but still with profound impairments cognitively 5. FEN/malnutrition---D3 Nectars---   -eating 100% at present  -continue to push fluids   -water protocol---BMET looked ok today 6. Sz prophylaxis---keppra  LOS (Days) 19 A FACE TO FACE EVALUATION WAS PERFORMED  Meredith Staggers 12/25/2013 7:58 AM

## 2013-12-25 NOTE — Patient Care Conference (Signed)
Inpatient RehabilitationTeam Conference and Plan of Care Update Date: 12/24/2013   Time: 2:45 PM    Patient Name: Katie [name]      Medical Record Number: [id number]  Date of Birth: 17-Dec-1950 Sex: Female         Room/Bed: 4W02C/4W02C-01 Payor Info: Payor: [place] / Plan: [employer] [place] PPO / Product Type: *No Product type* /    Admitting Diagnosis: LUNG CA WITH BRAIN METS  Admit Date/Time:  12/06/2013  4:05 PM Admission Comments: No comment available   Primary Diagnosis:  <principal problem not specified> Principal Problem: <principal problem not specified>  Patient Active Problem List   Diagnosis Date Noted  . Brain tumor 12/06/2013  . 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 Malnutrition of moderate degree 11/19/2013  . Small cell lung cancer 11/18/2013  . HTN (hypertension) 11/18/2013  . Steroid-induced hyperglycemia 11/18/2013  . Fall with head trauma 11/18/2013  . Underweight 11/18/2013  . Protein-calorie malnutrition, severe 11/18/2013  . 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 brain metastases 11/15/2013  . Acute respiratory failure 11/12/2013  . Altered mental status 11/12/2013  . Syncope secondary to brain metastasis 11/11/2013    Expected Discharge Date: Expected Discharge Date: 12/28/13  Team Members Present: Physician leading conference: Dr. Alger Simons Social Worker Present: Lennart Pall, LCSW Nurse Present: Elliot Cousin, RN PT Present: Otis Brace, PT;Bridgett Ripa, PT OT Present: Gareth Morgan, OT;Jennifer Tamala Julian, [place];Roanna Epley, COTA SLP Present: Weston Anna, SLP PPS Coordinator present : Daiva Nakayama, RN, CRRN;Becky Alwyn Ren, PT     Current Status/Progress Goal Weekly Team Focus  Medical   slow progress. impaired insight  family ed  family training, insight   Bowel/Bladder   toileting every 3 hours . occasional incontinence episodes > 50% of time . LBM 5-4   managed mod assist   educate on bladder program    Swallow/Nutrition/ Hydration   Dys. 3 textures with nectar-thick liquids, Min A for use of strategies  Supervision for use of safe swallow strategies  increased utilization of swallowing compensatory strategies    ADL's   UB dressing-supervision; bathing-min A/mod A; LB dressing-mod A; transfers-max A; decreased activity tolerance and attention  bathing-min A; UB dressing-supervisoin; LB dressing-mod A; transfers-mod A; toileting-mod A  activity tolerance, attention to task, transfers, sit<>stand, standing balance, family educaiton   Mobility   Ax2 persons-max A x1person for ambulation; Mod-Max A for transfers; Min-mod A for w/c propulsion, bed mobility; Max cueing overall  Downgraded due to level of physical assistance required. (S) for sitting balance; Min A for bed mobility, w/c propulsion; Mod A standing balance, transfers; Max A for amb in controlled enviornemtn  functional endurance, attention to task, transfers, standing balance, ambulation, w/c propulsion, family education   Communication   Min-Mod A  Min A to express wants/needs  increased awareness of verbosity and tangents   Safety/Cognition/ Behavioral Observations  Max-Total A   Mod-Max A  attention, orientation, awareness    Pain   n/a         Skin   right posterior scalp incision healing barier cream to buttocks prevention of breakdown deaccessed porta cath   no new breakdown   educate family on skin care     Rehab Goals Patient on target to meet rehab goals: Yes *See Care Plan and progress notes for long and short-term goals.  Barriers to Discharge: profound deficits    Possible Resolutions to Barriers:  see above, family ed    Discharge Planning/Teaching Needs:  home with  husband and other family to provide 24/7 assistance      Team Discussion:  Limited progress this week.  Recent CT check ok.  PA to f/u with oncology about plans for chemo.  Focus to be on transfers and family education.  Still working on cues and consistency.  Safety for pt is  primary concern.  Overall her attention has improved somewhat.  Still coughing with thin liquids so will d/c on nectar thick and H2O protocol.  Family has been educated.  Can be continent b/b with timed toileting.  Revisions to Treatment Plan:  Recommend change in d/c to end of week - target now 12/28/13.   Continued Need for Acute Rehabilitation Level of Care: The patient requires daily medical management by a physician with specialized training in physical medicine and rehabilitation for the following conditions: Daily direction of a multidisciplinary physical rehabilitation program to ensure safe treatment while eliciting the highest outcome that is of practical value to the patient.: Yes Daily medical management of patient stability for increased activity during participation in an intensive rehabilitation regime.: Yes Daily analysis of laboratory values and/or radiology reports with any subsequent need for medication adjustment of medical intervention for : Post surgical problems;Neurological problems  Lennart Pall 12/25/2013, 11:13 AM

## 2013-12-25 NOTE — Progress Notes (Signed)
Speech Language Pathology Daily Session Note  Patient Details  Name: Katie [name] MRN: [id number] Date of Birth: Feb 21, 1951  Today's Date: 12/25/2013 Time: 1450-1530 Time Calculation (min): 40 min  Short Term Goals: Week 3: SLP Short Term Goal 1 (Week 3): Patient will consume PO with supervision level verbal cues for pacing and portion control. SLP Short Term Goal 2 (Week 3): Patient will sustain attention to task for 1 minute with Mod verbal cues for redirection. SLP Short Term Goal 3 (Week 3): Patient will utilize external aids for orientation with Max assist.  SLP Short Term Goal 4 (Week 3): Patient will label 2 deficits with Max clinician cues.  SLP Short Term Goal 5 (Week 3): Patient will solve basic problems with Max assist. SLP Short Term Goal 6 (Week 3): Patient maintain topic of conversation for 2 turns with Min clinician cues.  Skilled Therapeutic Interventions: Skilled treatment session focused on cognitive goals and family education with the patient's niece in regards to patient's current cognitive function and strategies at home to increase safety, attention and working memory. [name] niece verbalized and demonstrated understanding of appropriate cueing during functional conversation to gain patient's attention and increase accuracy with verbal responses. [name] niece also educated on pt's current swallowing function, diet recommendations, appropriate textures, compensatory strategies and water protocol. [name] niece verbalized understanding and asked appropriate questions. Continue with current plan of care.    FIM:  Comprehension Comprehension Mode: Auditory;Visual Comprehension: 3-Understands basic 50 - 74% of the time/requires cueing 25 - 50%  of the time Expression Expression Mode: Verbal Expression: 2-Expresses basic 25 - 49% of the time/requires cueing 50 - 75% of the time. Uses single words/gestures. Social Interaction Social Interaction: 2-Interacts appropriately 25 - 49%  of time - Needs frequent redirection. Problem Solving Problem Solving: 1-Solves basic less than 25% of the time - needs direction nearly all the time or does not effectively solve problems and may need a restraint for safety Memory Memory: 1-Recognizes or recalls less than 25% of the time/requires cueing greater than 75% of the time  Pain Pain Assessment Pain Assessment: No/denies pain  Therapy/Group: Individual Therapy  Buzzy Han 12/25/2013, 3:40 PM

## 2013-12-25 NOTE — Progress Notes (Signed)
Occupational Therapy Session Note  Patient Details  Name: Katie [name] MRN: [id number] Date of Birth: [birth year]  Today's Date: 12/25/2013 Time: 8657-8469 Time Calculation (min): 60 min  Short Term Goals: Week 3:  OT Short Term Goal 1 (Week 3): Pt will complete toileting tasks with mod A OT Short Term Goal 2 (Week 3): Pt will perform showe transfers with mod A OT Short Term Goal 3 (Week 3): Pt will perform toilet transfers with mod A  Skilled Therapeutic Interventions/Progress Updates:    Pt resting in bed with sister at side.  Pt engaged in BADL retraining including bathing at shower level and dressing with sit<>stand from w/c at sink.  [name] sister present throughout session and intermittently provided verbal cues.  [name] sister reeducated on appropriate quantity of verbal cues and allowing patient to process requests and initiate/attempt performing task independently.  Sister verbalized understanding.  Pt required mod verbal cues for task initiation during shower and perseverated on bathing face and legs requiring verbal cues to initiate bathing other body parts.  Recommended to sister that patient not stand in tub but have someone wash patient's buttocks while in bed before bathing.  Pt required mod verbal cues for task initiation with dressing tasks.  Pt required assistance pulling up pants this morning and presented with increased fatigue near the end of the session and required increased assistance.  Focus on activity tolerance, bed mobility, transfers, BADLs, task initiation, sequencing, attention to task, sit<>stand, standing balance, and safety awareness.  Therapy Documentation Precautions:  Precautions Precautions: Fall Precaution Comments: impulsive, decreased safety awareness Restrictions Weight Bearing Restrictions: No   Pain: Pain Assessment Pain Assessment: No/denies pain  See FIM for current functional status  Therapy/Group: Individual Therapy  Leroy Libman 12/25/2013, 9:49 AM

## 2013-12-26 ENCOUNTER — Inpatient Hospital Stay (HOSPITAL_COMMUNITY): Payer: BC Managed Care – PPO

## 2013-12-26 ENCOUNTER — Inpatient Hospital Stay (HOSPITAL_COMMUNITY): Payer: BC Managed Care – PPO | Admitting: Speech Pathology

## 2013-12-26 ENCOUNTER — Encounter (HOSPITAL_COMMUNITY): Payer: BC Managed Care – PPO

## 2013-12-26 ENCOUNTER — Inpatient Hospital Stay (HOSPITAL_COMMUNITY): Payer: BC Managed Care – PPO | Admitting: Physical Therapy

## 2013-12-26 LAB — GLUCOSE, CAPILLARY
GLUCOSE-CAPILLARY: 104 mg/dL — AB (ref 70–99)
GLUCOSE-CAPILLARY: 141 mg/dL — AB (ref 70–99)
GLUCOSE-CAPILLARY: 146 mg/dL — AB (ref 70–99)
Glucose-Capillary: 137 mg/dL — ABNORMAL HIGH (ref 70–99)
Glucose-Capillary: 156 mg/dL — ABNORMAL HIGH (ref 70–99)
Glucose-Capillary: 111 mg/dL — ABNORMAL HIGH (ref 70–99)

## 2013-12-26 MED ORDER — METHYLPHENIDATE HCL 5 MG PO TABS
5.0000 mg | ORAL_TABLET | Freq: Two times a day (BID) | ORAL | Status: DC
  Administered 2013-12-26 – 2013-12-28 (×4): 5 mg via ORAL
  Filled 2013-12-26 (×4): qty 1

## 2013-12-26 NOTE — Progress Notes (Signed)
[place] PHYSICAL MEDICINE & REHABILITATION     PROGRESS NOTE    Subjective/Complaints: No new issues. Pt comfortable. A 12 point review of systems has been performed and if not noted above is otherwise negative.  Ros limited due to cognition/language.  Objective: Vital Signs: Blood pressure 124/72, pulse 71, temperature 97.6 F (36.4 C), temperature source Oral, resp. rate 18, height 5\' 7"  (1.702 m), weight 58 kg (127 lb 13.9 oz), SpO2 99.00%. No results found.  Recent Labs  12/25/13 0505  WBC 7.8  HGB 11.2*  HCT 33.2*  PLT 136*    Recent Labs  12/25/13 0505  NA 133*  K 4.4  CL 94*  GLUCOSE 104*  BUN 21  CREATININE 0.40*  CALCIUM 8.4   CBG (last 3)   Recent Labs  12/26/13 0008 12/26/13 0417 12/26/13 0718  GLUCAP 146* 137* 141*    Wt Readings from Last 3 Encounters:  12/25/13 58 kg (127 lb 13.9 oz)  12/03/13 54.4 kg (119 lb 14.9 oz)  12/03/13 54.4 kg (119 lb 14.9 oz)    Physical Exam:  Constitutional: No distress.  Keeps eyes closed HENT:  Head: Normocephalic and atraumatic.  Right Ear: External ear normal.  Left Ear: External ear normal.  Eyes: Conjunctivae and EOM are normal. Pupils are equal, round, and reactive to light.  Neck: No JVD present. No tracheal deviation present. No thyromegaly present.  Cardiovascular: Normal rate and regular rhythm. Exam reveals friction rub. Exam reveals no gallop.  No murmur heard.  Respiratory: No respiratory distress. She has no wheezes. She has no rales. She exhibits no tenderness.  GI: She exhibits no distension.Soft  There is no tenderness. There is no rebound.  Lymphadenopathy:  She has no cervical adenopathy.  Neurological:  Attention waxes and wanes stil.    Language of confusion persistent. Typically keeps eyes closed unless cued. moves all 4 limbs. Senses pain.  left HH and likely ?diplopia. Impaired depth perception/color? Skin: Skin is warm. Op site intact with some fluid still under skin---no  real change Psychiatric:   confused   Assessment/Plan: 1. 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 Physiatrist is providing close team supervision and 24 hour management of active medical problems listed below. Physiatrist and rehab team continue to assess barriers to discharge/monitor patient progress toward functional and medical goals.  Finalizing dc planning for Saturday dc    FIM: FIM - Bathing Bathing Steps Patient Completed: Chest;Right Arm;Left Arm;Abdomen;Front perineal area;Right upper leg;Left upper leg;Right lower leg (including foot);Left lower leg (including foot) Bathing: 4: Min-Patient completes 8-9 85f 10 parts or 75+ percent  FIM - Upper Body Dressing/Undressing Upper body dressing/undressing steps patient completed: Thread/unthread right sleeve of pullover shirt/dresss;Thread/unthread left sleeve of pullover shirt/dress;Put head through opening of pull over shirt/dress;Pull shirt over trunk Upper body dressing/undressing: 5: Supervision: Safety issues/verbal cues FIM - Lower Body Dressing/Undressing Lower body dressing/undressing steps patient completed: Thread/unthread right pants leg;Thread/unthread left pants leg;Pull pants up/down;Don/Doff left sock;Don/Doff right sock Lower body dressing/undressing: 3: Mod-Patient completed 50-74% of tasks  FIM - Toileting Toileting steps completed by patient: Performs perineal hygiene Toileting Assistive Devices: Grab bar or rail for support Toileting: 2: Max-Patient completed 1 of 3 steps  FIM - Radio producer Devices: Product manager Transfers: 0-Activity did not occur  FIM - Control and instrumentation engineer Devices: Arm rests Bed/Chair Transfer: 0: Activity did not  occur  FIM - Locomotion: Wheelchair Distance: 100 Locomotion: Wheelchair: 2:  Travels 50 - 149 ft with minimal assistance (Pt.>75%) FIM - Locomotion: Ambulation Locomotion: Ambulation Assistive Devices: Other (comment) (R UE over therapist's shoulder) Ambulation/Gait Assistance: 2: Max assist Locomotion: Ambulation: 2: Travels 50 - 149 ft with maximal assistance (Pt: 25 - 49%)  Comprehension Comprehension Mode: Auditory Comprehension: 2-Understands basic 25 - 49% of the time/requires cueing 51 - 75% of the time  Expression Expression Mode: Verbal Expression: 2-Expresses basic 25 - 49% of the time/requires cueing 50 - 75% of the time. Uses single words/gestures.  Social Interaction Social Interaction: 2-Interacts appropriately 25 - 49% of time - Needs frequent redirection.  Problem Solving Problem Solving: 1-Solves basic less than 25% of the time - needs direction nearly all the time or does not effectively solve problems and may need a restraint for safety  Memory Memory: 1-Recognizes or recalls less than 25% of the time/requires cueing greater than 75% of the time   Medical Problem List and Plan:  Right temporal occipital metastatic tumor (SCLC)--  -XRT completed  -steroid taper--decadron--decrease to 4mg  q12---probably will keep her on this dose for the time being -CTX as outpt - . Dr. Marin Olp is arranging for pt to have CTX at [facility] under another provider -head CT with improvement of edema around tumor. Some increase in ventricle size -CBC stable   1. DVT Prophylaxis/Anticoagulation: Pharmaceutical: Lovenox  2. Pain Management: tylenol, hydrocodone  3. Mood: Resumed Lexapro.   4. Neuropsych: This patient is not capable of making decisions on her own behalf.   -haven't seen enough cognitively with ritalin to justify. Will wean off before dc 5. FEN/malnutrition---D3 Nectars---   -eating 100% at present  -continue to push fluids   -water protocol---BMET ok 6. Sz prophylaxis---keppra  LOS (Days) 20 A FACE TO FACE EVALUATION WAS PERFORMED  Meredith Staggers 12/26/2013 8:23 AM

## 2013-12-26 NOTE — Progress Notes (Signed)
Assisted husband with transfer of patient to toilet requiring max assistance from staff. Will continue to educate husband with toilet and bsc transfers.

## 2013-12-26 NOTE — Progress Notes (Signed)
Physical Therapy Session Note  Patient Details  Name: Katie [name] MRN: [id number] Date of Birth: 1951/05/08  Today's Date: 12/26/2013 Time: 0930-1030 Time Calculation (min): 60 min  Short Term Goals: Week 3:  PT Short Term Goal 1 (Week 3): STGs=LTGs due to anticipated LOS  Skilled Therapeutic Interventions/Progress Updates:   Pt received sitting in w/c with husband present. Pt and husband agreeable to family training for car transfer to pt's actual car. W/c mobility using BLEs and LUE holding therapist's hand with min assist for steering > 150 ft on rehab unit while husband pulled car around. Pt performed car transfer with husband x 2 with inconsistent performance mod A-total A with verbal cues and setup from therapist. Pt with increased difficulty performing sit > stand from car seat. Therapist assessed pt's personal w/c as appropriate for pt upon d/c and switched pt into this chair with CIR cushion. [name] husband reports he plans to buy w/c cushion (cushion without w/c not covered). Pt left sitting in personal w/c with quick release belt on and husband present.   Therapy Documentation Precautions:  Precautions Precautions: Fall Precaution Comments: impulsive, decreased safety awareness Restrictions Weight Bearing Restrictions: No Pain: Pain Assessment Pain Assessment: No/denies pain  See FIM for current functional status  Therapy/Group: Individual Therapy  Laretta Alstrom 12/26/2013, 10:30 AM

## 2013-12-26 NOTE — Progress Notes (Signed)
Speech Language Pathology Daily Session Note  Patient Details  Name: Katie [name] MRN: [id number] Date of Birth: [birth year]  Today's Date: 12/26/2013 Time: 1425-1510 Time Calculation (min): 45 min  Short Term Goals: Week 3: SLP Short Term Goal 1 (Week 3): Patient will consume PO with supervision level verbal cues for pacing and portion control. SLP Short Term Goal 2 (Week 3): Patient will sustain attention to task for 1 minute with Mod verbal cues for redirection. SLP Short Term Goal 3 (Week 3): Patient will utilize external aids for orientation with Max assist.  SLP Short Term Goal 4 (Week 3): Patient will label 2 deficits with Max clinician cues.  SLP Short Term Goal 5 (Week 3): Patient will solve basic problems with Max assist. SLP Short Term Goal 6 (Week 3): Patient maintain topic of conversation for 2 turns with Min clinician cues.  Skilled Therapeutic Interventions: Skilled treatment session focused on cognitive goals and family education with the patient's sister in regards to patient's current cognitive function and strategies at home to increase safety, attention and working memory. [name] sister verbalized and demonstrated understanding of appropriate cueing during functional conversation to gain patient's attention and increase accuracy with verbal responses. [name] sister also educated on pt's current swallowing function, diet recommendations, appropriate textures, compensatory strategies and water protocol. [name] sister verbalized understanding and asked appropriate questions. Pt appeared lethargic throughout the session with increased verbosity and language of confusion and required total A for functional problem solving and sustained attention for 30 seconds to simple card game. Continue with current plan of care.   FIM:  Comprehension Comprehension Mode: Auditory Comprehension: 2-Understands basic 25 - 49% of the time/requires cueing 51 - 75% of the time Expression Expression Mode:  Verbal Expression: 2-Expresses basic 25 - 49% of the time/requires cueing 50 - 75% of the time. Uses single words/gestures. Social Interaction Social Interaction: 2-Interacts appropriately 25 - 49% of time - Needs frequent redirection. Problem Solving Problem Solving: 1-Solves basic less than 25% of the time - needs direction nearly all the time or does not effectively solve problems and may need a restraint for safety Memory Memory: 1-Recognizes or recalls less than 25% of the time/requires cueing greater than 75% of the time  Pain Pain Assessment Pain Assessment: No/denies pain  Therapy/Group: Individual Therapy  Buzzy Han 12/26/2013, 3:46 PM

## 2013-12-26 NOTE — Progress Notes (Signed)
NUTRITION FOLLOW UP  Intervention:   1. Supplements; continue Ensure Pudding po TID, each supplement provides 170 kcal and 4 grams of protein  2. Continue MVI.   NUTRITION DIAGNOSIS:  Increased nutrient needs related to healing, increased nutrient demand as evidenced by pt admitted to intensive rehab program, s/p brain surgery.   Monitor:  1. Food/Beverage; pt meeting >/=90% estimated needs with tolerance. Met. 2. Wt/wt change; monitor trends. Met.    Assessment:   Pt admitted with deconditioning s/p brain tumor removal.  Pt has been eating well since admission to rehab. PO continues to be 100% of meals. She is also ordered Ensure Pudding TID which can be continued at d/c if warranted by pt, but is not likely required to support intake as pt has been eating well consistently. Weight has been variable, but is overall consistent with admission wt. Note plans for d/c soon.    Height: Ht Readings from Last 1 Encounters:  12/06/13 5' 7"  (1.702 m)    Weight Status:   Wt Readings from Last 1 Encounters:  12/25/13 127 lb 13.9 oz (58 kg)  Admission wt: 125 lbs  Re-estimated needs:  Kcal: 1650-1850  Protein: 90-110g  Fluid: ~1.8 L/day  Skin: intact  Diet Order: Dysphagia 3, nectar thick   Intake/Output Summary (Last 24 hours) at 12/26/13 1542 Last data filed at 12/26/13 1300  Gross per 24 hour  Intake   1200 ml  Output      0 ml  Net   1200 ml    Last BM: 5/7   Labs:   Recent Labs Lab 12/25/13 0505  NA 133*  K 4.4  CL 94*  CO2 27  BUN 21  CREATININE 0.40*  CALCIUM 8.4  GLUCOSE 104*    CBG (last 3)   Recent Labs  12/26/13 0417 12/26/13 0718 12/26/13 1137  GLUCAP 137* 141* 104*    Scheduled Meds: . antiseptic oral rinse  15 mL Mouth Rinse QID  . atorvastatin  10 mg Oral QPM  . cyanocobalamin  500 mcg Oral Daily  . dexamethasone  4 mg Oral Q12H  . enoxaparin (LOVENOX) injection  40 mg Subcutaneous Q24H  . escitalopram  10 mg Oral Daily  .  feeding supplement (ENSURE)  1 Container Oral TID BM  . heparin lock flush  500 Units Intracatheter Q30 days  . hydrocerin   Topical BID  . insulin aspart  0-15 Units Subcutaneous 6 times per day  . levETIRAcetam  500 mg Oral BID  . methylphenidate  5 mg Oral BID WC  . multivitamin with minerals  1 tablet Oral Daily  . pantoprazole  40 mg Oral Daily    Continuous Infusions:   Brynda Greathouse, MS RD LDN Clinical Inpatient Dietitian Pager: [phone] Weekend/After hours pager: [phone]

## 2013-12-26 NOTE — Progress Notes (Signed)
Occupational Therapy Session Note  Patient Details  Name: Katie [name] MRN: [id number] Date of Birth: 12-Oct-1950  Today's Date: 12/26/2013 Time: 0800-0900 Time Calculation (min): 60 min  Short Term Goals: Week 3:  OT Short Term Goal 1 (Week 3): Pt will complete toileting tasks with mod A OT Short Term Goal 2 (Week 3): Pt will perform showe transfers with mod A OT Short Term Goal 3 (Week 3): Pt will perform toilet transfers with mod A  Skilled Therapeutic Interventions/Progress Updates:    Pt engaged in BADL retraining including bathing at shower level and dressing with sit<>stand from w/c.  [name] husband present and provided assistance and appropriate verbal cues with bathing and dressing tasks. Recommended to husband that patient not stand in shower at home. Pt continues to require mod verbal cues for task initiation and attention to task.  Pt fatigues near the end of the session and requires increased assistance and verbal cues.  Discussed energy conservation strategies with husband and importance of schedule at home.  Focus on transfers, activity tolerance, sit<>stand, standing balance, task initiation, attention to task, and safety awareness.  Therapy Documentation Precautions:  Precautions Precautions: Fall Precaution Comments: impulsive, decreased safety awareness Restrictions Weight Bearing Restrictions: No   Pain: Pain Assessment Pain Assessment: No/denies pain  See FIM for current functional status  Therapy/Group: Individual Therapy  Time: 1130-1200 Pt denied pain Individual Therapy  Pt resting in w/c with husband at side upon arrival.  Focus on practicing tub bench transfers.  Pt exhibited increased difficulty following commands and was unable to complete tub bench transfer with husband.  Discussed safety issues with husband and recommended not taking a shower if patient is unable to follow commands and actively participate in transfers.  Recommended to husband to  practice tub bench transfers with home health OT/PT before taking shower at home.    Anselmo Rod Meldrick Buttery 12/26/2013, 10:24 AM

## 2013-12-26 NOTE — Progress Notes (Signed)
Recreational Therapy Discharge Summary Patient Details  Name: Katie [name] MRN: [id number] Date of Birth: 01/01/1951 Today's Date: 12/26/2013  Long term goals set: 1  Long term goals met: 1  Comments on progress toward goals: Pt is scheduled for discharge home with family on 5/9.  Pt is discharging at overall mod assist/moderate multimodal cues for simple leisure activities.  Pt is most limited by decreased cognition, specifically sustained attention during tasks.  [name] family has been present observing & participating in therapy session throughout LOS and educated on appropriate physical assistance and cuing.  Reasons for discharge: discharge from hospital  Patient/family agrees with progress made and goals achieved: Yes  [name] 12/26/2013, 8:36 AM

## 2013-12-27 ENCOUNTER — Inpatient Hospital Stay (HOSPITAL_COMMUNITY): Payer: BC Managed Care – PPO

## 2013-12-27 ENCOUNTER — Encounter: Payer: Self-pay | Admitting: Nurse Practitioner

## 2013-12-27 ENCOUNTER — Encounter (HOSPITAL_COMMUNITY): Payer: BC Managed Care – PPO

## 2013-12-27 ENCOUNTER — Inpatient Hospital Stay (HOSPITAL_COMMUNITY): Payer: BC Managed Care – PPO | Admitting: Speech Pathology

## 2013-12-27 LAB — GLUCOSE, CAPILLARY
GLUCOSE-CAPILLARY: 131 mg/dL — AB (ref 70–99)
GLUCOSE-CAPILLARY: 150 mg/dL — AB (ref 70–99)
Glucose-Capillary: 114 mg/dL — ABNORMAL HIGH (ref 70–99)
Glucose-Capillary: 123 mg/dL — ABNORMAL HIGH (ref 70–99)
Glucose-Capillary: 124 mg/dL — ABNORMAL HIGH (ref 70–99)
Glucose-Capillary: 127 mg/dL — ABNORMAL HIGH (ref 70–99)

## 2013-12-27 MED ORDER — DEXAMETHASONE 4 MG PO TABS: 4.0000 mg | ORAL_TABLET | Freq: Two times a day (BID) | ORAL | Status: AC

## 2013-12-27 MED ORDER — LEVETIRACETAM 500 MG PO TABS: 500.0000 mg | ORAL_TABLET | Freq: Two times a day (BID) | ORAL | Status: AC

## 2013-12-27 MED ORDER — METHYLPHENIDATE HCL 5 MG PO TABS: 5.0000 mg | ORAL_TABLET | Freq: Two times a day (BID) | ORAL | Status: AC

## 2013-12-27 MED ORDER — PANTOPRAZOLE SODIUM 40 MG PO TBEC: 40.0000 mg | DELAYED_RELEASE_TABLET | Freq: Every day | ORAL | Status: AC

## 2013-12-27 MED ORDER — ADULT MULTIVITAMIN W/MINERALS CH: 1.0000 | ORAL_TABLET | Freq: Every day | ORAL | Status: AC

## 2013-12-27 MED ORDER — HYDROCODONE-ACETAMINOPHEN 5-325 MG PO TABS: 1.0000 | ORAL_TABLET | Freq: Four times a day (QID) | ORAL | Status: AC | PRN

## 2013-12-27 MED ORDER — ATORVASTATIN CALCIUM 10 MG PO TABS
10.0000 mg | ORAL_TABLET | Freq: Every evening | ORAL | Status: AC
Start: 2013-12-27 — End: ?

## 2013-12-27 MED ORDER — ESCITALOPRAM OXALATE 10 MG PO TABS: 10.0000 mg | ORAL_TABLET | ORAL | Status: AC

## 2013-12-27 NOTE — Progress Notes (Signed)
Occupational Therapy Discharge Summary  Patient Details  Name: Katie [name] MRN: [id number] Date of Birth: 1951-02-06  Today's Date: 12/27/2013  Patient has met 7 of 8 long term goals due to improved activity tolerance, improved balance, postural control, improved attention and improved coordination.  Pt progress has been inconsistent with BADLs but has progressed in bahting to min A seated on shower seat, supervision with UB dressing, and mod A with LB dressing.  Pt performs tub transfers and shower transfers with mod A but continues to require total A for toileting tasks.  Pt requires mod verbal cues for task initiation and redirection to task.  Pt demonstrated difficulty locating items on left and occasionally undershoot/overshoots when reaching for objects on left.  Suspect a left visual field cut but unable to formally assess secondary to patient decreased cognition/attention.  [name] husband, sister, and niece have been present for therapy sessions and participated in providing care. Patient to discharge at overall Min-Mod Assist level (Total assist with toileting).  Patient's care partner is independent to provide the necessary physical and cognitive assistance at discharge.    Reasons goals not met: decreased attention, cognition, delayed processing, motor planning, sequencing, initiation, postural control, balance strategies, and vision.  Recommendation:  Patient will benefit from ongoing skilled OT services in home health setting to continue to advance functional skills in the area of BADL and Reduce care partner burden.  Equipment: drop arm BSC, pt owns tub transfer bench  Reasons for discharge: discharge from hospital  Patient/family agrees with progress made and goals achieved: Yes  OT Discharge   ADL  SEE FIM Vision/Perception  Vision- History Baseline Vision/History: Wears glasses Wears Glasses: At all times Patient Visual Report: Other (comment) (pt unable to verbalize any  changes) Vision- Assessment Vision Assessment?: Vision impaired- to be further tested in functional context Additional Comments: Pt exhibits difficulty locating items in left visual field - suspect left field cut but unable to formally assess secondary to pt's difficutly following commands and attending to tasks  Cognition Overall Cognitive Status: Impaired/Different from baseline Arousal/Alertness: Awake/alert Orientation Level: Oriented to person Attention: Sustained Focused Attention: Appears intact Focused Attention Impairment: Verbal basic;Functional basic Sustained Attention: Impaired Sustained Attention Impairment: Verbal basic;Functional basic Memory: Impaired Memory Impairment: Storage deficit;Decreased recall of new information;Retrieval deficit Decreased Short Term Memory: Verbal basic;Functional basic Awareness Impairment: Intellectual impairment Problem Solving: Impaired Problem Solving Impairment: Verbal basic;Functional basic Reasoning: Impaired Decision Making: Impaired Behaviors: Perseveration Safety/Judgment: Impaired Sensation Sensation Light Touch: Not tested Stereognosis: Not tested Hot/Cold: Appears Intact Proprioception: Not tested Additional Comments: difficult to formally assess secondary to pt's inability to follow commands and accurately self report Coordination Gross Motor Movements are Fluid and Coordinated: Yes Fine Motor Movements are Fluid and Coordinated: Not tested    Trunk/Postural Assessment  Cervical Assessment Cervical Assessment: Within Functional Limits Thoracic Assessment Thoracic Assessment: Within Functional Limits Lumbar Assessment Lumbar Assessment: Within Functional Limits Postural Control Postural Control: Within Functional Limits  Balance Static Sitting Balance Static Sitting - Balance Support: Feet supported;Bilateral upper extremity supported Static Sitting - Level of Assistance: 5: Stand by assistance;3: Mod  assist Extremity/Trunk Assessment RUE Assessment RUE Assessment: Within Functional Limits LUE Assessment LUE Assessment: Within Functional Limits  See FIM for current functional status  Leroy Libman 12/27/2013, 12:18 PM

## 2013-12-27 NOTE — Progress Notes (Signed)
Dr. Marin Olp has spoken to Dr. Janeece Riggers @ [facility]. Pt has been set-up to see him for further tx and evaluation. All medical records have been faxed to [phone] as requested. Confirmation has been received.

## 2013-12-27 NOTE — Discharge Instructions (Signed)
Inpatient Rehab Discharge Instructions  Katie [name] Discharge date and time:    Activities/Precautions/ Functional Status: Activity: activity as tolerated with supervision. No driving.  Diet:  Wound Care: keep wound clean and dry  Functional status:  ___ No restrictions     ___ Walk up steps independently _X__ 24/7 supervision/assistance   ___ Walk up steps with assistance ___ Intermittent supervision/assistance  ___ Bathe/dress independently ___ Walk with walker     ___ Bathe/dress with assistance ___ Walk Independently    ___ Shower independently _X__ Walk with assistance    _X__ Shower with assistance ___ No alcohol     ___ Return to work/school ________   COMMUNITY REFERRALS UPON DISCHARGE:    Home Health:   PT     OT                     Agency: [facility]   Phone: [phone]   Medical Equipment/Items Ordered: commode & 30 inch transfer board                                                     Agency/Supplier: [facility] @ [phone]         Special Instructions:  Dr. Marin Olp arranging for ongoing treatments at [facility] with appointment to be made  My questions have been answered and I understand these instructions. I will adhere to these goals and the provided educational materials after my discharge from the hospital.  Patient/Caregiver Signature _______________________________ Date __________  Clinician Signature _______________________________________ Date __________  Please bring this form and your medication list with you to all your follow-up doctor's appointments.

## 2013-12-27 NOTE — Progress Notes (Signed)
Physical Therapy Discharge Summary  Patient Details  Name: Katie [name] MRN: [id number] Date of Birth: [birth year]  Today's Date: 12/27/2013 Time: Treatment Session 1: 11-12; Treatment Session 2: 1355-1420 Time Calculation (min): Treatment Session 1: 60 min; Treatment Session 2: 27mn  Patient has met 10 of 11 long term goals due to improved activity tolerance, improved balance, improved postural control, increased strength, improved attention, improved awareness and improved coordination. Pt currently demonstrates ability to perform bed mobility and w/c propulsion w/ min A; bed<>w/c transfers w/ mod A, car transfers w/ max A and use of slide board; t/f sit<>stand and SPT bed<>w/c w/ mod-max Ax1person and ambulation w/ max A x1person (therapist only). Pt req mod-max multimodal cues for performance of safe functional mobility. Family not cleared to ambulate with pt in home environment due to level of assistance as well as manual facilitation required for safe completion. Patient's husband, sister and niece have been present for therapy and completed family training to provide the necessary physical and cognitive assistance at discharge.  Reasons goals not met: Pt continues to require mod-max A vs. Target goal of mod A for consistent completion of car transfers with use of slide board.   Recommendation:  Patient will benefit from ongoing skilled PT services in home health setting to continue to advance safe functional mobility, address ongoing impairments in muscle weakness, decreased cardiorespitatory and functional endurance, decreased coordination, decreased visual perceptual skills and inattention to the left, decreased midline orientation and decreased attention to left, decreased initiation, decreased sustained attention, decreased awareness, decreased motor planning, decreased problem solving, decreased safety awareness, decreased memory and delayed processing and decreased standing balance,  decreased postural control, decreased balance strategies, and minimize fall risk.  Equipment: Slide board, AFO. Pt owns a wheelchair appropriate for pt use.  Reasons for discharge: treatment goals met and discharge from hospital  Patient/family agrees with progress made and goals achieved: Yes  Skilled Therapeutic Interventions Treatment Session 1: 1:1. Pt received sitting in w/c, ready for therapy. Focus this session on d/c planning, family education, toileting and car transfer training. [name] husband able to appropriate don pt's AFO. Pt found to be incontinent of bladder. Husband providing mod A for toilet transfer and total A for clean up. Therapist providing intermittent cueing to husband for safety and improved sequencing of task. Pt, husband and sister educated regarding goals of [facility], need for w/c cushion (they owned a w/c appropriate for pt's use), chux pads as well as use of quick release belt/gait belt for pt safety in home environment. Following demonstration by therapist, pt's husband assisting pt w/ SBT w/c<>car, req mod-max A. Emphasized use of towel under pt's bottom to assist with repositioning in w/c and ease of SBTs especially when pt fatigued and therefore requiring increased physical assistance from family. Overall, pt req mod-max multimodal cues throughout session for sustained attention, initiation and sequencing. Family verbalized understanding of all education this session. Pt sitting in w/c at end of session w/ all needs in reach, quick release belt in place and family in room.   Treatment Session 2: 1:1. Pt received sitting in w/c, ready for therapy. Focus this session on family training during functional transfers, w/c propulsion and ambulation. Pt req mod-max A x1person to ambulate from r[employer] Corporationw/ manual facilitation to promote trunk flexion improved postioning over BOS. Gait characterized by narrow BOS and dysmetric step lengths, use of AFO + heel wedges for improved  stability and prevention of genu recurvatum in R LE. [name] husband  able to safely assist pt w/ t/f sup<>sit on tx mat w/ min A, squat pivot t/f w/c>tx mat w/ mod A as well as t/f sit<>stand and SPT w/ max A. [name] family verbalized understanding of all education this session. Pt req min A for w/c propulsion x150' back towards room w/ L HHA to guide pt. Pt sitting in w/c at end of session w/ all needs in reach, quick release belt in place and family in room.   PT Discharge Precautions/Restrictions Precautions Precautions: Fall Restrictions Weight Bearing Restrictions: No Vital Signs Therapy Vitals Temp: 98.1 F (36.7 C) Temp src: Oral Pulse Rate: 68 Resp: 18 BP: 114/71 mmHg Patient Position, if appropriate: Sitting Oxygen Therapy SpO2: 100 % O2 Device: None (Room air) Pain   Vision/Perception  Vision- History  Baseline Vision/History: Wears glasses  Wears Glasses: At all times  Patient Visual Report: Other (comment) (pt unable to verbalize any changes)  Vision- Assessment  Vision Assessment?: Vision impaired- to be further tested in functional context  Additional Comments: Pt exhibits difficulty locating items in left visual field - suspect left field cut but unable to formally assess secondary to pt's difficutly following commands and attending to tasks  Cognition Overall Cognitive Status: Impaired/Different from baseline Arousal/Alertness: Awake/alert Orientation Level: Oriented to person Attention: Sustained Focused Attention: Appears intact Focused Attention Impairment: Verbal basic;Functional basic Sustained Attention: Impaired Sustained Attention Impairment: Verbal basic;Functional basic Selective Attention: Impaired Selective Attention Impairment: Verbal basic;Functional basic Memory: Impaired Memory Impairment: Storage deficit;Decreased recall of new information;Retrieval deficit Decreased Short Term Memory: Verbal basic;Functional basic Awareness: Impaired Awareness  Impairment: Intellectual impairment Problem Solving: Impaired Problem Solving Impairment: Verbal basic;Functional basic Executive Function:  (all impaired due to lower level deficits) Reasoning: Impaired Reasoning Impairment: Verbal basic;Functional basic Decision Making: Impaired Decision Making Impairment: Verbal basic;Functional basic Behaviors: Perseveration;Confabulation Safety/Judgment: Impaired Sensation Sensation Light Touch: Impaired by gross assessment Proprioception: Impaired by gross assessment Additional Comments: difficult to formally assess secondary to pt's inability to follow commands and accurately self report Coordination Gross Motor Movements are Fluid and Coordinated: No Fine Motor Movements are Fluid and Coordinated: Not tested Motor  Motor Motor: Other (comment) Motor - Discharge Observations: Proximal weakness  Mobility Bed Mobility Bed Mobility: Supine to Sit;Sit to Supine Supine to Sit: 4: Min assist Supine to Sit Details: Tactile cues for sequencing;Verbal cues for sequencing;Tactile cues for placement Sit to Supine: 4: Min assist Sit to Supine - Details: Tactile cues for sequencing;Verbal cues for sequencing;Tactile cues for placement Transfers Transfers: Yes Sit to Stand: 3: Mod assist;2: Max assist Sit to Stand Details: Manual facilitation for weight shifting;Tactile cues for sequencing;Tactile cues for placement;Verbal cues for sequencing;Verbal cues for precautions/safety;Manual facilitation for placement;Tactile cues for initiation Sit to Stand Details (indicate cue type and reason): dependent upon level of alertness and sustained attention to task Stand to Sit: 3: Mod assist;2: Max assist Stand to Sit Details (indicate cue type and reason): Tactile cues for sequencing;Tactile cues for placement;Tactile cues for weight beaing;Verbal cues for sequencing;Verbal cues for precautions/safety;Manual facilitation for weight shifting;Manual facilitation for  placement;Tactile cues for initiation Stand to Sit Details: dependent upon level of alertness and sustained attention to task Stand Pivot Transfers: 3: Mod assist;2: Max assist Stand Pivot Transfer Details: Verbal cues for precautions/safety;Manual facilitation for weight shifting;Tactile cues for weight beaing;Tactile cues for sequencing;Tactile cues for placement;Verbal cues for sequencing;Manual facilitation for placement;Tactile cues for initiation Stand Pivot Transfer Details (indicate cue type and reason): dependent upon level of alertness and sustained attention to task  Squat Pivot Transfers: 3: Mod assist Squat Pivot Transfer Details: Tactile cues for initiation Locomotion  Ambulation Ambulation: Yes Ambulation/Gait Assistance: 2: Max assist Ambulation Distance (Feet): 200 Feet Assistive device: Other (Comment) ([name] L UE placed over therapist's shoulders) Ambulation/Gait Assistance Details: Manual facilitation for weight shifting;Manual facilitation for placement;Verbal cues for gait pattern;Verbal cues for precautions/safety;Tactile cues for posture Ambulation/Gait Assistance Details: Manual facilitation for hip flexion for improved alignment over BOS; Dysmetic step legnth, narrow BOS, intermittent scissoring, AFO w/ heel wedge for increased stability and decreased gen recurvatum in R LE. Gait Gait: Yes Gait Pattern: Impaired Gait Pattern: Narrow base of support;Scissoring;Right genu recurvatum Gait velocity: decreased Stairs / Additional Locomotion Stairs: No (Unsafe) Wheelchair Mobility Wheelchair Mobility: Yes Wheelchair Assistance: 4: Advertising account executive Details: Verbal cues for sequencing;Verbal cues for technique;Tactile cues for sequencing Wheelchair Propulsion: Both lower extermities Wheelchair Parts Management: Needs assistance Distance: 150'  Trunk/Postural Assessment  Cervical Assessment Cervical Assessment: Within Functional Limits Thoracic  Assessment Thoracic Assessment: Within Functional Limits Lumbar Assessment Lumbar Assessment: Within Functional Limits Postural Control Postural Control: Deficits on evaluation Trunk Control: Pt demonstrates proximal weakness  Balance Static Sitting Balance Static Sitting - Balance Support: Feet supported;Left upper extremity supported;Right upper extremity supported;No upper extremity supported Static Sitting - Level of Assistance: 5: Stand by assistance Dynamic Sitting Balance Dynamic Sitting - Balance Support: Right upper extremity supported;Left upper extremity supported;Bilateral upper extremity supported;Feet supported Dynamic Sitting - Level of Assistance: 5: Stand by assistance;4: Min assist Dynamic Sitting - Balance Activities: Lateral lean/weight shifting;Forward lean/weight shifting;Reaching for objects Static Standing Balance Static Standing - Balance Support: During functional activity;Left upper extremity supported Static Standing - Level of Assistance: 3: Mod assist Dynamic Standing Balance Dynamic Standing - Balance Support: During functional activity;Left upper extremity supported Dynamic Standing - Level of Assistance: 2: Max assist Dynamic Standing - Balance Activities: Lateral lean/weight shifting;Forward lean/weight shifting;Reaching for objects Extremity Assessment      RLE Assessment RLE Assessment: Exceptions to [facility] RLE Strength RLE Overall Strength: Deficits RLE Overall Strength Comments: Unable to formally assess due to cognitive impairments. Pt grossly demonstrates 3+/5 to 4-/5 functional strength LLE Assessment LLE Assessment: Exceptions to [facility] LLE Strength LLE Overall Strength: Deficits LLE Overall Strength Comments: Unable to formally assess due to congnitive impairments. Pt grossly demonstrates 4/5 functional strength  See FIM for current functional status  Gilmore Laroche 12/27/2013, 6:21 PM

## 2013-12-27 NOTE — Progress Notes (Signed)
Speech Language Pathology Session Note & Discharge Summary  Patient Details  Name: Katie [name] MRN: [id number] Date of Birth: [birth year]  Today's Date: 12/27/2013 Time: 1000-1045 Time Calculation (min): 45 min  Skilled Therapeutic Interventions:  Skilled treatment session focused on cognitive goals and family education. SLP facilitated session by providing decreased visual field during a basic reading comprehension task at the word and phrase level, pt completed with extra time and Mod A verbal and visual cues. Pt consistently followed 1 step commands but required increased cueing for 2 step commands due to decreased attention and working memory. Pt demonstrated increased language of confusion and required total A for recall of day of discharge after a 5 second delay. [name] sister present and asked appropriate questions. Family education complete and handouts given to reinforce information.   Patient has met 3 of 7 long term goals.  Patient to discharge at [facility] Max;Total level.   Reasons goals not met: Pt continues to require overall Max-total A for orientation, problem solving, memory and awareness with functional tasks    Clinical Impression/Discharge Summary: Pt has made small, inconsistent gains and has met 3 of 7 LTG's this reporting period due to increased utilization in swallowing compensatory strategies and attention with functional tasks. Currently, pt is consuming Dys. 3 textures with nectar-thick liquids and is on the water protocol. Pt had repeat MBS and demonstrated intermittent silent penetration and aspiration with thin liquids. Pt unable to utilize swallowing compensatory strategies due to her poor cognition. Pt requires overall Min A verbal cues to keep her eyes open during functional tasks, Mod A for sustained attention and initiation and Mod-Max A for problem solving, awareness, safety and working memory with functional tasks. Pt also consistently demonstrates language of  confusion with verbosity and has decreased awareness of errors. [name] husband, sister, and niece have been present for therapy sessions and participated in providing care and education is complete. Patient will discharge home with 24 hour supervision and would benefit from f/u home health services to maximize cognitive and swallowing function and overall functional independence.   Care Partner:  Caregiver Able to Provide Assistance: Yes  Type of Caregiver Assistance: Physical;Cognitive  Recommendation:  Home Health SLP;24 hour supervision/assistance  Rationale for SLP Follow Up: Maximize cognitive function and independence;Maximize swallowing safety;Maximize functional communication;Reduce caregiver burden   Equipment: Thickener    Reasons for discharge: Discharged from hospital   Patient/Family Agrees with Progress Made and Goals Achieved: Yes   See FIM for current functional status  Buzzy Han 12/27/2013, 3:47 PM

## 2013-12-27 NOTE — Progress Notes (Signed)
Social Work Patient ID: Katie [name], female   DOB: 24-Aug-1950, 63 y.o.   MRN: [id number] Therapist added a 30 inch transfer board to pt's DME order.  Have contacted [facility] and added to existing order.

## 2013-12-27 NOTE — Progress Notes (Signed)
Occupational Therapy Session Note  Patient Details  Name: Katie [name] MRN: [id number] Date of Birth: 1950/12/28  Today's Date: 12/27/2013 Time: 0800-0855 Time Calculation (min): 55 min  Short Term Goals: Week 3:  OT Short Term Goal 1 (Week 3): Pt will complete toileting tasks with mod A OT Short Term Goal 2 (Week 3): Pt will perform showe transfers with mod A OT Short Term Goal 3 (Week 3): Pt will perform toilet transfers with mod A  Skilled Therapeutic Interventions/Progress Updates:    Pt in bed finishing breakfast with sister at side assisting.  Pt engaged in ADL retraining including bathing at shower level and dressing with sit<>stand from w/c.  Pt performed squat pivot transfer bed->w/c with mod A and mod verbal cues for BUE placement.  Pt required extra time to initiate transitional movement.  Pt required mod verbal cues for task initiation for bathing tasks and for redirection.  Pt perseverates on bathing face and upper legs. Pt continues to require increased assistance with sit<>stand (mod/max A) and max verbal cues for BUE/BLE placement prior to sit<>stand.  Pt fatigues quickly and requires extra time to complete tasks with multiple rest breaks throughout session.   Therapy Documentation Precautions:  Precautions Precautions: Fall Precaution Comments: impulsive, decreased safety awareness Restrictions Weight Bearing Restrictions: No Pain: Pain Assessment Pain Assessment: No/denies pain  See FIM for current functional status  Therapy/Group: Individual Therapy  Leroy Libman 12/27/2013, 8:56 AM

## 2013-12-27 NOTE — Progress Notes (Signed)
Social Work Discharge Note Discharge Note  The overall goal for the admission was met for:   Discharge location: Yes-HOME WITH HUSBAND-[facility] HR CARE  Length of Stay: Yes-22 DAYS  Discharge activity level: Yes-SUPERVISION/MIN LEVEL  Home/community participation: Yes  Services provided included: MD, RD, PT, OT, SLP, RN, CM, TR, Pharmacy, Neuropsych and [place]: Private Insurance: [place]  Follow-up services arranged: Home Health: [facility]-PT,OT,SPT, DME: [facility] [place] and Patient/Family has no preference for HH/DME agencies  Comments (or additional information):FAMILY TRAINING [place]  Patient/Family verbalized understanding of follow-up arrangements: Yes  Individual responsible for coordination of the follow-up plan: JOHN-HUSBAND  Confirmed correct DME delivered: Elease Hashimoto 12/27/2013    Gardiner Rhyme Kawehi Hostetter

## 2013-12-27 NOTE — Progress Notes (Signed)
[facility] PHYSICAL MEDICINE & REHABILITATION     PROGRESS NOTE    Subjective/Complaints: No complaints. Knew she was going home but thought it was today A 12 point review of systems has been performed and if not noted above is otherwise negative.  Ros limited due to cognition/language.  Objective: Vital Signs: Blood pressure 116/74, pulse 68, temperature 98 F (36.7 C), temperature source Oral, resp. rate 18, height 5\' 7"  (1.702 m), weight 58 kg (127 lb 13.9 oz), SpO2 98.00%. No results found.  Recent Labs  12/25/13 0505  WBC 7.8  HGB 11.2*  HCT 33.2*  PLT 136*    Recent Labs  12/25/13 0505  NA 133*  K 4.4  CL 94*  GLUCOSE 104*  BUN 21  CREATININE 0.40*  CALCIUM 8.4   CBG (last 3)   Recent Labs  12/27/13 0043 12/27/13 0434 12/27/13 0700  GLUCAP 124* 123* 114*    Wt Readings from Last 3 Encounters:  12/25/13 58 kg (127 lb 13.9 oz)  12/03/13 54.4 kg (119 lb 14.9 oz)  12/03/13 54.4 kg (119 lb 14.9 oz)    Physical Exam:  Constitutional: No distress.  Keeps eyes closed HENT:  Head: Normocephalic and atraumatic.  Right Ear: External ear normal.  Left Ear: External ear normal.  Eyes: Conjunctivae and EOM are normal. Pupils are equal, round, and reactive to light.  Neck: No JVD present. No tracheal deviation present. No thyromegaly present.  Cardiovascular: Normal rate and regular rhythm. Exam reveals friction rub. Exam reveals no gallop.  No murmur heard.  Respiratory: No respiratory distress. She has no wheezes. She has no rales. She exhibits no tenderness.  GI: She exhibits no distension.Soft  There is no tenderness. There is no rebound.  Lymphadenopathy:  She has no cervical adenopathy.  Neurological:  Attention waxes and wanes stil.    Language of confusion persistent. Typically keeps eyes closed unless cued. moves all 4 limbs. Senses pain.  left HH and likely ?diplopia. Impaired depth perception Skin: Skin is warm. Op site intact with some fluid  still under skin---no real change Psychiatric:   confused   Assessment/Plan: 1. 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 Physiatrist is providing close team supervision and 24 hour management of active medical problems listed below. Physiatrist and rehab team continue to assess barriers to discharge/monitor patient progress toward functional and medical goals.     FIM: FIM - Bathing Bathing Steps Patient Completed: Chest;Right Arm;Left Arm;Abdomen;Front perineal area;Right upper leg;Left upper leg;Right lower leg (including foot);Left lower leg (including foot) Bathing: 4: Min-Patient completes 8-9 25f 10 parts or 75+ percent  FIM - Upper Body Dressing/Undressing Upper body dressing/undressing steps patient completed: Thread/unthread right sleeve of pullover shirt/dresss;Thread/unthread left sleeve of pullover shirt/dress;Put head through opening of pull over shirt/dress;Pull shirt over trunk Upper body dressing/undressing: 5: Supervision: Safety issues/verbal cues FIM - Lower Body Dressing/Undressing Lower body dressing/undressing steps patient completed: Thread/unthread right pants leg;Thread/unthread left pants leg;Pull pants up/down;Don/Doff left sock;Don/Doff right sock Lower body dressing/undressing: 3: Mod-Patient completed 50-74% of tasks  FIM - Toileting Toileting steps completed by patient: Performs perineal hygiene Toileting Assistive Devices: Grab bar or rail for support Toileting: 2: Max-Patient completed 1 of 3 steps  FIM - Radio producer Devices: Product manager Transfers: 0-Activity did not occur  FIM - Control and instrumentation engineer Devices: Arm rests Bed/Chair Transfer: 0: Activity did  not occur  FIM - Locomotion: Wheelchair Distance: 150 Locomotion: Wheelchair: 4: Travels 150 ft or more:  maneuvers on rugs and over door sillls with minimal assistance (Pt.>75%) FIM - Locomotion: Ambulation Locomotion: Ambulation Assistive Devices: Other (comment) (R UE over therapist's shoulder) Ambulation/Gait Assistance: 2: Max assist Locomotion: Ambulation: 0: Activity did not occur  Comprehension Comprehension Mode: Auditory Comprehension: 2-Understands basic 25 - 49% of the time/requires cueing 51 - 75% of the time  Expression Expression Mode: Verbal Expression: 2-Expresses basic 25 - 49% of the time/requires cueing 50 - 75% of the time. Uses single words/gestures.  Social Interaction Social Interaction: 2-Interacts appropriately 25 - 49% of time - Needs frequent redirection.  Problem Solving Problem Solving: 1-Solves basic less than 25% of the time - needs direction nearly all the time or does not effectively solve problems and may need a restraint for safety  Memory Memory: 1-Recognizes or recalls less than 25% of the time/requires cueing greater than 75% of the time   Medical Problem List and Plan:  Right temporal occipital metastatic tumor (SCLC)--  -XRT completed  -steroid taper--decadron--decrease to 4mg  q12---probably will keep her on this dose for the time being -CTX as outpt - . Dr. Marin Olp is arranging for pt to have CTX at [facility] under another provider--have not heard from him regarding referral/date/time, etc -head CT with improvement of edema around tumor. Some increase in ventricle size -CBC stable   1. DVT Prophylaxis/Anticoagulation: Pharmaceutical: Lovenox  2. Pain Management: tylenol, hydrocodone  3. Mood: Resumed Lexapro.   4. Neuropsych: This patient is not capable of making decisions on her own behalf.   -haven't seen enough cognitively with ritalin to justify. Will wean off before dc 5. FEN/malnutrition---D3 Nectars---   -eating 100% at present  -continue to push fluids   -water protocol---BMET ok 6. Sz prophylaxis---keppra  LOS (Days) 21 A FACE TO  FACE EVALUATION WAS PERFORMED  Meredith Staggers 12/27/2013 8:14 AM

## 2013-12-28 DIAGNOSIS — R4182 Altered mental status, unspecified: Secondary | ICD-10-CM

## 2013-12-28 LAB — GLUCOSE, CAPILLARY
Glucose-Capillary: 114 mg/dL — ABNORMAL HIGH (ref 70–99)
Glucose-Capillary: 133 mg/dL — ABNORMAL HIGH (ref 70–99)
Glucose-Capillary: 153 mg/dL — ABNORMAL HIGH (ref 70–99)

## 2013-12-28 NOTE — Progress Notes (Signed)
Patient left at 0940 with family. No issues.

## 2013-12-28 NOTE — Progress Notes (Signed)
Patient ID: Katie [name], female   DOB: 1951/07/04, 63 y.o.   MRN: [id number] Patient ID: Katie [name], female   DOB: 1951/01/23, 63 y.o.   MRN: [id number]   Patient ID: Katie [name], female   DOB: 07/05/1951, 63 y.o.   MRN: [id number]   [facility]     PROGRESS NOTE    Subjective/Complaints:  12/28/2013.   63 year old patient who has a history of small cell lung cancer, who was admitted to the hospital with syncope, secondary to brain metastases.  Hospital course, complicated by acute respiratory failure.  Medical problems include hypertension and steroid-induced hyperglycemia.  The follicles associated with some head trauma.  She has malnutrition, secondary to her malignancy.  Slept well again. No new issues. Husband at bedside.  Anxious for d/c today Ros limited due to cognition/language.  Past Medical History  Diagnosis Date  . Anxiety     takes lexapro    History   Social History  . Marital Status: Married    Spouse Name: N/A    Number of Children: N/A  . Years of Education: N/A   Occupational History  . Not on file.   Social History Main Topics  . Smoking status: Current Every Day Smoker -- 1.50 packs/day for 45 years  . Smokeless tobacco: Not on file  . Alcohol Use: Not on file  . Drug Use: Not on file  . Sexual Activity: Not on file   Other Topics Concern  . Not on file   Social History Narrative  . No narrative on file    Past Surgical History  Procedure Laterality Date  . Appendectomy    . Colon surgery      polyps  removed  . Craniotomy Right 11/12/2013    Procedure: RIGHT PARIETAL CRANIOTOMY;  Surgeon: Kristeen Miss, MD;  Location: [facility];  Service: Neurosurgery;  Laterality: Right;  . Craniotomy Right 11/22/2013    Procedure: Revision of Right Parietal occipital wound due to CSF Leak;  Surgeon: Kristeen Miss, MD;  Location: [facility];  Service: Neurosurgery;  Laterality: Right;  Revision of Right Parietal  occipital wound due to CSF Leak    No family history on file.  No Known Allergies  No current facility-administered medications on file prior to encounter.   Current Outpatient Prescriptions on File Prior to Encounter  Medication Sig Dispense Refill  . aspirin EC 81 MG tablet Take 81 mg by mouth daily.      . cyanocobalamin 500 MCG tablet Take 500 mcg by mouth daily.        BP 127/80  Pulse 72  Temp(Src) 98.7 F (37.1 C) (Oral)  Resp 18  Ht 5\' 7"  (1.702 m)  Wt 58 kg (127 lb 13.9 oz)  BMI 20.02 kg/m2  SpO2 98%    Intake/Output Summary (Last 24 hours) at 12/28/13 0831 Last data filed at 12/27/13 1800  Gross per 24 hour  Intake    720 ml  Output      0 ml  Net    720 ml    Patient Vitals for the past 24 hrs:  BP Temp Temp src Pulse Resp SpO2  12/28/13 0626 127/80 mmHg 98.7 F (37.1 C) Oral 72 18 98 %  12/27/13 2224 98/60 mmHg 97.6 F (36.4 C) Oral 73 18 99 %  12/27/13 1422 114/71 mmHg 98.1 F (36.7 C) Oral 68 18 100 %    Objective: Vital Signs: Blood pressure 127/80, pulse 72,  temperature 98.7 F (37.1 C), temperature source Oral, resp. rate 18, height 5\' 7"  (1.702 m), weight 58 kg (127 lb 13.9 oz), SpO2 98.00%.  No results found for this basename: WBC, HGB, HCT, PLT,  in the last 72 hours No results found for this basename: NA, K, CL, CO, GLUCOSE, BUN, CREATININE, CALCIUM,  in the last 72 hours CBG (last 3)   Recent Labs  12/27/13 2358 12/28/13 0400 12/28/13 0705  GLUCAP 153* 114* 133*    Wt Readings from Last 3 Encounters:  12/25/13 58 kg (127 lb 13.9 oz)  12/03/13 54.4 kg (119 lb 14.9 oz)  12/03/13 54.4 kg (119 lb 14.9 oz)    Physical Exam:  Constitutional: No distress.  Frail appearing, alert talkative but tends to keep eyes closed HENT:  Head: Normocephalic and atraumatic.  Right Ear: External ear normal.  Left Ear: External ear normal.  Eyes: Conjunctivae and EOM are normal. Pupils are equal, round, and reactive to light.  Neck: No JVD  present. No tracheal deviation present. No thyromegaly present.  Cardiovascular: Normal rate and regular rhythm. Exam reveals friction rub. Exam reveals no gallop.  No murmur heard.  Respiratory: No respiratory distress. She has no wheezes. She has no rales. She exhibits no tenderness.  Port a cath R chest wall GI: She exhibits no distension. There is no tenderness. There is no rebound.  Lymphadenopathy:  She has no cervical adenopathy.  Neurological:  Attention waxes and wanes. Still confused.    Language of confusion. Typically keeps eyes closed unless cued. moves all 4 limbs.   Assessment/Plan: 1. 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   Medical Problem List and Plan:  Right temporal occipital metastatic tumor (SCLC)--  -XRT ongoing--14 total treatments planned -steroid taper--decadron  1. DVT Prophylaxis/Anticoagulation: Pharmaceutical: Lovenox  2. Pain Management: tylenol, hydrocodone  3. Mood: Resumed Lexapro.   4. Neuropsych: This patient is not capable of making decisions on her own behalf.   -added ritalin for attention/focus--?benefit 5. FEN/malnutrition---D3 Nectars--- f/u swallow study recommended that she continue on this  -eating 100% at present  -continue to push fluids  6. Sz prophylaxis---keppra 7. Stable for discharge  LOS (Days) [place] PERFORMED  Marletta Lor 12/28/2013 8:31 AM

## 2014-01-01 ENCOUNTER — Ambulatory Visit: Payer: Self-pay | Admitting: Internal Medicine

## 2014-01-01 LAB — COMPREHENSIVE METABOLIC PANEL
Albumin: 2.7 g/dL — ABNORMAL LOW (ref 3.4–5.0)
Alkaline Phosphatase: 18 U/L — ABNORMAL LOW
Anion Gap: 4 — ABNORMAL LOW (ref 7–16)
BUN: 22 mg/dL — AB (ref 7–18)
Bilirubin,Total: 1.1 mg/dL — ABNORMAL HIGH (ref 0.2–1.0)
CALCIUM: 8.3 mg/dL — AB (ref 8.5–10.1)
CREATININE: 0.34 mg/dL — AB (ref 0.60–1.30)
Chloride: 95 mmol/L — ABNORMAL LOW (ref 98–107)
Co2: 32 mmol/L (ref 21–32)
EGFR (Non-African Amer.): >60
GLUCOSE: 93 mg/dL (ref 65–99)
Osmolality: 266 (ref 275–301)
Potassium: 4 mmol/L (ref 3.5–5.1)
SGOT(AST): 24 U/L (ref 15–37)
SGPT (ALT): 73 U/L (ref 12–78)
Sodium: 131 mmol/L — ABNORMAL LOW (ref 136–145)
Total Protein: 5.5 g/dL — ABNORMAL LOW (ref 6.4–8.2)

## 2014-01-01 LAB — CBC CANCER CENTER
Bands: 2 %
HCT: 36.8 % (ref 35.0–47.0)
HGB: 12.5 g/dL (ref 12.0–16.0)
Lymphocytes: 20 %
MCH: 32.9 pg (ref 26.0–34.0)
MCHC: 34 g/dL (ref 32.0–36.0)
MCV: 97 fL (ref 80–100)
MONOS PCT: 8 %
Metamyelocyte: 2 %
Platelet: 194 x10 3/mm (ref 150–440)
RBC: 3.8 10*6/uL (ref 3.80–5.20)
RDW: 15.7 % — AB (ref 11.5–14.5)
Segmented Neutrophils: 68 %
WBC: 9.6 x10 3/mm (ref 3.6–11.0)

## 2014-01-01 LAB — MAGNESIUM: MAGNESIUM: 1.8 mg/dL

## 2014-01-06 LAB — CREATININE, SERUM
Creatinine: 0.49 mg/dL — ABNORMAL LOW (ref 0.60–1.30)
EGFR (Non-African Amer.): >60

## 2014-01-07 NOTE — Progress Notes (Signed)
Discharge summary # [phone]

## 2014-01-08 ENCOUNTER — Encounter: Payer: Self-pay | Admitting: Radiation Oncology

## 2014-01-08 NOTE — Discharge Summary (Signed)
NAMEDEMIANA, CRUMBLEY NO.:  [id number]  MEDICAL RECORD NO.:  [id number]  LOCATION:  4W02C                        FACILITY:  [facility]  PHYSICIAN:  Meredith Staggers, M.D.DATE OF BIRTH:  1951-07-20  DATE OF ADMISSION:  12/06/2013 DATE OF DISCHARGE:  12/28/2013                              DISCHARGE SUMMARY   DISCHARGE DIAGNOSES: 1. Metastatic lung cancer to the brain. 2. Depression. 3. Dysphagia.  HISTORY OF PRESENT ILLNESS:  Ms. Katie [name] is a 63 year old female with history of anxiety disorder who was admitted via [facility] on November 11, 2013, with syncopal episode and fall with injury to left frontal region.  MRI of the brain showed 4 x 4 x 6 cm right temporoparietal occipital mass with extensive vasogenic edema and high right frontal mass 2.3 x 2.3 cm likely metastatic disease.  The patient was taken to OR for right parieto-occipital training for resection of tumor on November 12, 2013, by Dr. Ellene Route.  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  CT of lung showed left lower lobe mass with subcarinal adenopathy.  She was evaluated by Dr. Marin Olp and chemo was recommended for micro metastatic disease as well as XRT to the brain.  She developed CSF leak requiring revision of the incision as well as drain on November 22, 2013.  Her mentation has fluctuated.  She was placed on vancomycin for HCAP.  She was noted to have subgaleal fluid collection and LP was done with resolution.  She has had dysphagia requiring D3 diet, nectar liquids. Whole brain XRT was completed on November 29, 2013.  Single-lumen PowerPort was placed by Interventional Radiology, in anticipation of chemo in the future.  Therapies were initiated and the patient with results and impaired balance with ataxia, impulsivity, as well as cognitive deficits.  She was admitted to [facility] for progressive therapies.  PAST MEDICAL HISTORY:   Positive for anxiety disorder, as well as colon polyps and appendectomy.  ALLERGIES:  No known drug allergies.  SOCIAL HISTORY:  The patient is married.  Husband reported that she has been smoking 1-1-1/2 pack per day.  She does not use any alcohol or illicit drugs.  RECENT LABS:  Check of lytes revealed sodium 133, potassium 4.4, chloride 94, CO2 27, BUN 21, creatinine 0.40.  CBC reveals hemoglobin 11.2, hematocrit 33.2, white count 7.8, platelets 136.  HOSPITAL COURSE:  Ms. Katie [name] was admitted to Rehab on December 06, 2013, for inpatient therapies to consist of PT, OT, and speech therapy at least 3 hours 5 days a week.  Past admission, physiatrist, rehab, RN, and therapy team have worked together to provide customized collaborative interdisciplinary care.  Rehab RN has worked with the patient on bowel and bladder program as well as safety plan.  Lexapro was resumed to help with mood stabilization.  She was maintained on dysphagia III diet with nectar liquids.  She was noted to have prerenal azotemia and family was encouraged to push nectar fluids.  Water protocol was initiated as dysphagia improved and followup labs prior to discharge shows acute renal insufficiency has resolved.  She had leukocytosis at  admission likely due to steroid effects and this has resolved by discharge.  She was started on slow steroid taper, and discharged to home on 4 mg Decadron q.12 hours.  She has had poor attention with easy distractibility and Ritalin trial was initiated. However, this was not very effective, therefore this was weaned off prior to discharge.  Her blood pressures have been monitored on b.i.d. basis and have been well controlled.  She has shown improvement in mentation as well as overall mobility during her stay.[facility] was contacted about transferring the patient's care to [facility]. Dr. Marin Olp has spoken to Dr. Barbette Reichmann and the  patient has been  set up to see him for followup treatment and evaluation. On Dec 27, 2013, the patient was discharged to home in improved condition.  During the patient's stay in rehab, weekly team conferences were held to monitor the patient's progress, set goals, as well as discuss barriers to discharge.  At time of discharge, she was able to follow one-step command consistently.  She required increased cueing for two-step commands due to poor attention and working memory.  She continues to demonstrate language of confusion and requires total assist for recall of daily information.  She requires extra time with moderate assist as well as  verbal and visual cues for basic reading comprehension task.  Family education was done with husband and sister with handouts regarding compensatory Strategies as well as assistance with problem solving, memory and awareness with functional tasks.  The patient currently requires min to mod assist for transfers.  She is able to perform car transfers with max assist with use of sliding board.  She requires mod to max assist to ambulate with therapist.  She continues to require moderate to max multimodal cues for safety with functional mobility and family was educated about not ambulating the patient at home due to amount of assistance required. The patient is able to complete bathing tasks with min assist when seated  in a shower.  She requires supervision for upper body dressing, mod assist for lower body dressing with moderate verbal cues for task initiation and Redirection to attend to task.  OT has suspicion that she has left visual field cut however, due to the patient's poor cognition and attention, this could not be  fully tested.  The patient will continue to receive followup home health PT, OT,  speech therapy by [facility] past discharge.    DISCHARGE MEDICATIONS: 1. Lipitor 10 mg p.o. per day. 2. Decadron 4 mg p.o. q.12 hours. 3. Lexapro 10 mg p.o.  per day. 4. Vicodin 5-325 one p.o. q.6 hours p.r.n. moderate pain. 5. Keppra 500 mg p.o. b.i.d. 6. Ritalin 5 mg p.o. with breakfast and lunch. 7. Multivitamin 1 p.o. per day. 8. Protonix 40 mg a day.  DIET:  Dysphagia III nectar liquids with full supervision at meals.  ACTIVITY LEVEL:  As tolerated, wheelchair level.  SPECIAL INSTRUCTIONS:  [facility] to provide PT, OT, speech therapy.  FOLLOWUP:  The patient to follow up with Dr. Naaman Plummer on February 24, 2014, at 11:20; follow up with Dr. Eppie Gibson, on Jan 17, 2014, at 3:10; follow up with Dr. Ezequiel Kayser on Jan 02, 2014, at 11 a.m.; Dr. Charma Igo office to contact you regarding appointment.     Reesa Chew, P.A.   ______________________________ Meredith Staggers, M.D.    PL/MEDQ  D:  01/07/2014  T:  01/08/2014  Job:  [id number]  cc:  Fredrich Romans, M.D.

## 2014-01-14 ENCOUNTER — Telehealth: Payer: Self-pay

## 2014-01-14 DIAGNOSIS — C3490 Malignant neoplasm of unspecified part of unspecified bronchus or lung: Secondary | ICD-10-CM | POA: Diagnosis not present

## 2014-01-14 DIAGNOSIS — G3184 Mild cognitive impairment, so stated: Secondary | ICD-10-CM | POA: Diagnosis not present

## 2014-01-14 DIAGNOSIS — Z483 Aftercare following surgery for neoplasm: Secondary | ICD-10-CM | POA: Diagnosis not present

## 2014-01-14 DIAGNOSIS — R4701 Aphasia: Secondary | ICD-10-CM

## 2014-01-14 DIAGNOSIS — R269 Unspecified abnormalities of gait and mobility: Secondary | ICD-10-CM | POA: Diagnosis not present

## 2014-01-14 LAB — CBC CANCER CENTER
Basophil #: 0 x10 3/mm (ref 0.0–0.1)
Basophil %: 0.5 %
EOS PCT: 0.9 %
Eosinophil #: 0 x10 3/mm (ref 0.0–0.7)
HCT: 33.3 % — ABNORMAL LOW (ref 35.0–47.0)
HGB: 11.6 g/dL — ABNORMAL LOW (ref 12.0–16.0)
LYMPHS PCT: 77.1 %
Lymphocyte #: 1.6 x10 3/mm (ref 1.0–3.6)
MCH: 33.2 pg (ref 26.0–34.0)
MCHC: 34.7 g/dL (ref 32.0–36.0)
MCV: 96 fL (ref 80–100)
Monocyte #: 0.1 x10 3/mm — ABNORMAL LOW (ref 0.2–0.9)
Monocyte %: 3.6 %
Neutrophil #: 0.4 x10 3/mm — ABNORMAL LOW (ref 1.4–6.5)
Neutrophil %: 17.9 %
PLATELETS: 132 x10 3/mm — AB (ref 150–440)
RBC: 3.49 10*6/uL — ABNORMAL LOW (ref 3.80–5.20)
RDW: 15.4 % — AB (ref 11.5–14.5)
WBC: 2 x10 3/mm — AB (ref 3.6–11.0)

## 2014-01-14 LAB — POTASSIUM: Potassium: 3.8 mmol/L (ref 3.5–5.1)

## 2014-01-14 LAB — CREATININE, SERUM
Creatinine: 0.36 mg/dL — ABNORMAL LOW (ref 0.60–1.30)
EGFR (African American): >60
EGFR (Non-African Amer.): >60

## 2014-01-14 LAB — MAGNESIUM: Magnesium: 1.7 mg/dL — ABNORMAL LOW

## 2014-01-14 NOTE — Telephone Encounter (Signed)
[facility] called to get orders to continue home health physical therapy.  Verbal order given.

## 2014-01-17 ENCOUNTER — Ambulatory Visit
Admission: RE | Admit: 2014-01-17 | Discharge: 2014-01-17 | Disposition: A | Discharge status: Home / Self Care | Payer: BC Managed Care – PPO | Source: Ambulatory Visit | Attending: Radiation Oncology | Admitting: Radiation Oncology

## 2014-01-17 DIAGNOSIS — C7931 Secondary malignant neoplasm of brain: Secondary | ICD-10-CM

## 2014-01-17 HISTORY — DX: History of falling: Z91.81

## 2014-01-17 HISTORY — DX: Presence of other vascular implants and grafts: Z95.828

## 2014-01-17 HISTORY — DX: Personal history of irradiation: Z92.3

## 2014-01-17 HISTORY — DX: Neoplasm of unspecified behavior of brain: D49.6

## 2014-01-17 HISTORY — DX: Other nonspecific abnormal finding of lung field: R91.8

## 2014-01-17 LAB — CBC CANCER CENTER
Bands: 12 %
Eosinophil: 1 %
HCT: 35.3 % (ref 35.0–47.0)
HGB: 12.1 g/dL (ref 12.0–16.0)
Lymphocytes: 35 %
MCH: 33.1 pg (ref 26.0–34.0)
MCHC: 34.4 g/dL (ref 32.0–36.0)
MCV: 96 fL (ref 80–100)
METAMYELOCYTE: 3 %
MONOS PCT: 14 %
Myelocyte: 2 %
NRBC/100 WBC: 1 /100
PLATELETS: 114 x10 3/mm — AB (ref 150–440)
PROMYELOCYTE: 1 %
RBC: 3.67 10*6/uL — ABNORMAL LOW (ref 3.80–5.20)
RDW: 15.5 % — ABNORMAL HIGH (ref 11.5–14.5)
Segmented Neutrophils: 28 %
Variant Lymphocyte: 4 %
WBC: 7.1 x10 3/mm (ref 3.6–11.0)

## 2014-01-17 LAB — CREATININE, SERUM
CREATININE: 0.46 mg/dL — AB (ref 0.60–1.30)
EGFR (African American): >60
EGFR (Non-African Amer.): >60

## 2014-01-17 LAB — MAGNESIUM: Magnesium: 1.7 mg/dL — ABNORMAL LOW

## 2014-01-17 LAB — POTASSIUM: Potassium: 4.1 mmol/L (ref 3.5–5.1)

## 2014-01-17 NOTE — Progress Notes (Signed)
  Radiation Oncology         [phone]) [phone] ________________________________  Today the patient's husband requested to come and talk with me alone; due to his wife's limited physical and mental capabilities, she remained at home.  She completed whole brain RT on 12-16-13 to 30Gy in 10 fractions for small cell lung brain metastases.   She started systemic therapy at [facility] with Dr Inez Pilgrim and Mr Broadfoot feels she is tolerating this without obvious ill effects.  She has not seen Dr Ellene Route at an outpatient.  She does does have any worsening headaches or worsening neurologic/cognitive changes per Mr Savino, but remains limited physically and mentally.   We discussed the options of continuing chemotherapy vs hospice and ultimately he knows it is best for him to consult with Dr Inez Pilgrim before any decisions are made. For now, he is leaning towards continuing chemotherapy since she is tolerating it well. I suggested he call Dr Clarice Pole office to see if followup with him is still advisable.  He and his wife will f/u with me PRN.  He knows that [facility] also has a radiation oncologist, Dr Baruch Gouty, if further palliative RT is needed. Also, I asked Mr Lievanos how he is doing under all of this stress. He reports good family and social support, and does not express a need for counseling or support groups at this time.  12/24/13 CT head  Results reviewed and discussed today. -----------------------------------    Eppie Gibson, MD

## 2014-01-19 ENCOUNTER — Encounter: Payer: Self-pay | Admitting: Radiation Oncology

## 2014-01-20 ENCOUNTER — Ambulatory Visit: Payer: Self-pay | Admitting: Internal Medicine

## 2014-01-23 LAB — CBC CANCER CENTER
BANDS NEUTROPHIL: 23 %
COMMENT - H1-COM1: NORMAL
HCT: 34.9 % — ABNORMAL LOW (ref 35.0–47.0)
HGB: 11.7 g/dL — AB (ref 12.0–16.0)
Lymphocytes: 7 %
MCH: 32.3 pg (ref 26.0–34.0)
MCHC: 33.5 g/dL (ref 32.0–36.0)
MCV: 96 fL (ref 80–100)
Metamyelocyte: 5 %
Monocytes: 4 %
Myelocyte: 1 %
Platelet: 143 x10 3/mm — ABNORMAL LOW (ref 150–440)
RBC: 3.62 10*6/uL — AB (ref 3.80–5.20)
RDW: 16.1 % — ABNORMAL HIGH (ref 11.5–14.5)
Segmented Neutrophils: 60 %
Variant Lymphocyte: 0 %
WBC: 51 x10 3/mm — AB (ref 3.6–11.0)

## 2014-01-23 LAB — POTASSIUM: POTASSIUM: 3.6 mmol/L (ref 3.5–5.1)

## 2014-01-23 LAB — MAGNESIUM: MAGNESIUM: 1.8 mg/dL

## 2014-01-28 LAB — MAGNESIUM: Magnesium: 1.6 mg/dL — ABNORMAL LOW

## 2014-01-28 LAB — COMPREHENSIVE METABOLIC PANEL
ALK PHOS: 66 U/L
ANION GAP: 7 (ref 7–16)
AST: 28 U/L (ref 15–37)
Albumin: 2.3 g/dL — ABNORMAL LOW (ref 3.4–5.0)
BILIRUBIN TOTAL: 0.4 mg/dL (ref 0.2–1.0)
BUN: 23 mg/dL — AB (ref 7–18)
CHLORIDE: 96 mmol/L — AB (ref 98–107)
CREATININE: 0.46 mg/dL — AB (ref 0.60–1.30)
Calcium, Total: 8.5 mg/dL (ref 8.5–10.1)
Co2: 31 mmol/L (ref 21–32)
GLUCOSE: 145 mg/dL — AB (ref 65–99)
Osmolality: 275 (ref 275–301)
Potassium: 3.7 mmol/L (ref 3.5–5.1)
SGPT (ALT): 56 U/L (ref 12–78)
SODIUM: 134 mmol/L — AB (ref 136–145)
TOTAL PROTEIN: 5.6 g/dL — AB (ref 6.4–8.2)

## 2014-01-28 LAB — CBC CANCER CENTER
Basophil #: 0.1 x10 3/mm (ref 0.0–0.1)
Basophil %: 0.3 %
Eosinophil #: 0 x10 3/mm (ref 0.0–0.7)
Eosinophil %: 0 %
HCT: 30.5 % — ABNORMAL LOW (ref 35.0–47.0)
HGB: 10.1 g/dL — AB (ref 12.0–16.0)
LYMPHS ABS: 2.3 x10 3/mm (ref 1.0–3.6)
Lymphocyte %: 5.5 %
MCH: 32 pg (ref 26.0–34.0)
MCHC: 33.1 g/dL (ref 32.0–36.0)
MCV: 96 fL (ref 80–100)
Monocyte #: 1.2 x10 3/mm — ABNORMAL HIGH (ref 0.2–0.9)
Monocyte %: 2.9 %
Neutrophil #: 38.1 x10 3/mm — ABNORMAL HIGH (ref 1.4–6.5)
Neutrophil %: 91.3 %
PLATELETS: 244 x10 3/mm (ref 150–440)
RBC: 3.16 10*6/uL — AB (ref 3.80–5.20)
RDW: 16.4 % — AB (ref 11.5–14.5)
WBC: 41.7 x10 3/mm — ABNORMAL HIGH (ref 3.6–11.0)

## 2014-02-03 LAB — POTASSIUM: Potassium: 3.4 mmol/L — ABNORMAL LOW (ref 3.5–5.1)

## 2014-02-03 LAB — CBC CANCER CENTER
BASOS PCT: 0.6 %
Basophil #: 0.1 x10 3/mm (ref 0.0–0.1)
EOS ABS: 0 x10 3/mm (ref 0.0–0.7)
EOS PCT: 0.1 %
HCT: 28.4 % — ABNORMAL LOW (ref 35.0–47.0)
HGB: 10 g/dL — ABNORMAL LOW (ref 12.0–16.0)
LYMPHS ABS: 1.3 x10 3/mm (ref 1.0–3.6)
Lymphocyte %: 12.7 %
MCH: 33.9 pg (ref 26.0–34.0)
MCHC: 35.2 g/dL (ref 32.0–36.0)
MCV: 96 fL (ref 80–100)
MONO ABS: 0 x10 3/mm — AB (ref 0.2–0.9)
MONOS PCT: 0.3 %
NEUTROS ABS: 8.8 x10 3/mm — AB (ref 1.4–6.5)
NEUTROS PCT: 86.3 %
Platelet: 292 x10 3/mm (ref 150–440)
RBC: 2.95 10*6/uL — ABNORMAL LOW (ref 3.80–5.20)
RDW: 15.8 % — ABNORMAL HIGH (ref 11.5–14.5)
WBC: 10.2 x10 3/mm (ref 3.6–11.0)

## 2014-02-03 LAB — MAGNESIUM: Magnesium: 1.7 mg/dL — ABNORMAL LOW

## 2014-02-06 LAB — CBC CANCER CENTER
Basophil #: 0 x10 3/mm (ref 0.0–0.1)
Basophil %: 0.1 %
EOS PCT: 0 %
Eosinophil #: 0 x10 3/mm (ref 0.0–0.7)
HCT: 29.3 % — ABNORMAL LOW (ref 35.0–47.0)
HGB: 10.3 g/dL — AB (ref 12.0–16.0)
LYMPHS PCT: 21.4 %
Lymphocyte #: 1.2 x10 3/mm (ref 1.0–3.6)
MCH: 33.6 pg (ref 26.0–34.0)
MCHC: 35.1 g/dL (ref 32.0–36.0)
MCV: 96 fL (ref 80–100)
MONOS PCT: 2 %
Monocyte #: 0.1 x10 3/mm — ABNORMAL LOW (ref 0.2–0.9)
NEUTROS PCT: 76.5 %
Neutrophil #: 4.4 x10 3/mm (ref 1.4–6.5)
Platelet: 206 x10 3/mm (ref 150–440)
RBC: 3.06 10*6/uL — ABNORMAL LOW (ref 3.80–5.20)
RDW: 16.3 % — AB (ref 11.5–14.5)
WBC: 5.7 x10 3/mm (ref 3.6–11.0)

## 2014-02-06 LAB — MAGNESIUM: Magnesium: 1.6 mg/dL — ABNORMAL LOW

## 2014-02-06 LAB — POTASSIUM: POTASSIUM: 3.5 mmol/L (ref 3.5–5.1)

## 2014-02-10 LAB — CBC CANCER CENTER
BANDS NEUTROPHIL: 5 %
HCT: 29.6 % — ABNORMAL LOW (ref 35.0–47.0)
HGB: 10.5 g/dL — ABNORMAL LOW (ref 12.0–16.0)
LYMPHS PCT: 39 %
MCH: 35 pg — AB (ref 26.0–34.0)
MCHC: 35.5 g/dL (ref 32.0–36.0)
MCV: 99 fL (ref 80–100)
Metamyelocyte: 1 %
Monocytes: 6 %
NRBC/100 WBC: 4 /100
Other Cells Blood: 2 %
Platelet: 194 x10 3/mm (ref 150–440)
RBC: 3 10*6/uL — ABNORMAL LOW (ref 3.80–5.20)
RDW: 17.3 % — AB (ref 11.5–14.5)
SEGMENTED NEUTROPHILS: 47 %
WBC: 2.7 x10 3/mm — ABNORMAL LOW (ref 3.6–11.0)

## 2014-02-10 LAB — MAGNESIUM: Magnesium: 1.7 mg/dL — ABNORMAL LOW

## 2014-02-10 LAB — POTASSIUM: Potassium: 3.8 mmol/L (ref 3.5–5.1)

## 2014-02-14 ENCOUNTER — Ambulatory Visit: Payer: Self-pay | Admitting: Physical Medicine & Rehabilitation

## 2014-02-18 LAB — COMPREHENSIVE METABOLIC PANEL
ALBUMIN: 2.7 g/dL — AB (ref 3.4–5.0)
ALK PHOS: 37 U/L — AB
Anion Gap: 7 (ref 7–16)
BILIRUBIN TOTAL: 0.5 mg/dL (ref 0.2–1.0)
BUN: 23 mg/dL — AB (ref 7–18)
CALCIUM: 9 mg/dL (ref 8.5–10.1)
CHLORIDE: 98 mmol/L (ref 98–107)
CO2: 32 mmol/L (ref 21–32)
Creatinine: 0.43 mg/dL — ABNORMAL LOW (ref 0.60–1.30)
EGFR (African American): >60
EGFR (Non-African Amer.): >60
Glucose: 102 mg/dL — ABNORMAL HIGH (ref 65–99)
OSMOLALITY: 278 (ref 275–301)
Potassium: 3.9 mmol/L (ref 3.5–5.1)
SGOT(AST): 29 U/L (ref 15–37)
SGPT (ALT): 45 U/L (ref 12–78)
Sodium: 137 mmol/L (ref 136–145)
Total Protein: 5.8 g/dL — ABNORMAL LOW (ref 6.4–8.2)

## 2014-02-18 LAB — CBC CANCER CENTER
BASOS PCT: 0.6 %
Basophil #: 0.1 x10 3/mm (ref 0.0–0.1)
EOS PCT: 0.1 %
Eosinophil #: 0 x10 3/mm (ref 0.0–0.7)
HCT: 31.1 % — ABNORMAL LOW (ref 35.0–47.0)
HGB: 10.6 g/dL — ABNORMAL LOW (ref 12.0–16.0)
Lymphocyte #: 2.5 x10 3/mm (ref 1.0–3.6)
Lymphocyte %: 11.5 %
MCH: 34.3 pg — AB (ref 26.0–34.0)
MCHC: 34 g/dL (ref 32.0–36.0)
MCV: 101 fL — ABNORMAL HIGH (ref 80–100)
Monocyte #: 1.9 x10 3/mm — ABNORMAL HIGH (ref 0.2–0.9)
Monocyte %: 8.5 %
NEUTROS PCT: 79.3 %
Neutrophil #: 17.5 x10 3/mm — ABNORMAL HIGH (ref 1.4–6.5)
PLATELETS: 393 x10 3/mm (ref 150–440)
RBC: 3.08 10*6/uL — ABNORMAL LOW (ref 3.80–5.20)
RDW: 21.2 % — ABNORMAL HIGH (ref 11.5–14.5)
WBC: 22.1 x10 3/mm — ABNORMAL HIGH (ref 3.6–11.0)

## 2014-02-18 LAB — MAGNESIUM: Magnesium: 1.8 mg/dL

## 2014-02-19 ENCOUNTER — Ambulatory Visit: Payer: Self-pay | Admitting: Internal Medicine

## 2014-02-24 ENCOUNTER — Encounter: Payer: BC Managed Care – PPO | Admitting: Physical Medicine & Rehabilitation

## 2014-02-25 LAB — MAGNESIUM: Magnesium: 1.7 mg/dL — ABNORMAL LOW

## 2014-02-25 LAB — CBC CANCER CENTER
BASOS ABS: 0.1 x10 3/mm (ref 0.0–0.1)
Basophil %: 0.4 %
Eosinophil #: 0 x10 3/mm (ref 0.0–0.7)
Eosinophil %: 0.1 %
HCT: 28.7 % — AB (ref 35.0–47.0)
HGB: 9.7 g/dL — ABNORMAL LOW (ref 12.0–16.0)
Lymphocyte #: 2.3 x10 3/mm (ref 1.0–3.6)
Lymphocyte %: 13.6 %
MCH: 34.8 pg — ABNORMAL HIGH (ref 26.0–34.0)
MCHC: 33.9 g/dL (ref 32.0–36.0)
MCV: 103 fL — AB (ref 80–100)
Monocyte #: 0.3 x10 3/mm (ref 0.2–0.9)
Monocyte %: 1.6 %
NEUTROS ABS: 14.2 x10 3/mm — AB (ref 1.4–6.5)
Neutrophil %: 84.3 %
PLATELETS: 213 x10 3/mm (ref 150–440)
RBC: 2.79 10*6/uL — ABNORMAL LOW (ref 3.80–5.20)
RDW: 20.2 % — ABNORMAL HIGH (ref 11.5–14.5)
WBC: 16.8 x10 3/mm — AB (ref 3.6–11.0)

## 2014-02-25 LAB — POTASSIUM: Potassium: 3.6 mmol/L (ref 3.5–5.1)

## 2014-02-25 LAB — CREATININE, SERUM
CREATININE: 0.38 mg/dL — AB (ref 0.60–1.30)
EGFR (African American): >60

## 2014-03-04 LAB — CBC CANCER CENTER
BASOS ABS: 0.1 x10 3/mm (ref 0.0–0.1)
Basophil %: 0.4 %
Eosinophil #: 0 x10 3/mm (ref 0.0–0.7)
Eosinophil %: 0.1 %
HCT: 28.2 % — ABNORMAL LOW (ref 35.0–47.0)
HGB: 9.4 g/dL — ABNORMAL LOW (ref 12.0–16.0)
LYMPHS PCT: 11.7 %
Lymphocyte #: 3.7 x10 3/mm — ABNORMAL HIGH (ref 1.0–3.6)
MCH: 35.4 pg — ABNORMAL HIGH (ref 26.0–34.0)
MCHC: 33.5 g/dL (ref 32.0–36.0)
MCV: 106 fL — ABNORMAL HIGH (ref 80–100)
MONO ABS: 0.8 x10 3/mm (ref 0.2–0.9)
Monocyte %: 2.6 %
Neutrophil #: 26.8 x10 3/mm — ABNORMAL HIGH (ref 1.4–6.5)
Neutrophil %: 85.2 %
Platelet: 104 x10 3/mm — ABNORMAL LOW (ref 150–440)
RBC: 2.66 10*6/uL — AB (ref 3.80–5.20)
RDW: 21.9 % — ABNORMAL HIGH (ref 11.5–14.5)
WBC: 31.4 x10 3/mm — ABNORMAL HIGH (ref 3.6–11.0)

## 2014-03-04 LAB — MAGNESIUM: Magnesium: 1.5 mg/dL — ABNORMAL LOW

## 2014-03-04 LAB — POTASSIUM: Potassium: 3.5 mmol/L (ref 3.5–5.1)

## 2014-03-11 LAB — CBC CANCER CENTER
Basophil #: 0.1 x10 3/mm (ref 0.0–0.1)
Basophil %: 0.3 %
EOS PCT: 0 %
Eosinophil #: 0 x10 3/mm (ref 0.0–0.7)
HCT: 29 % — ABNORMAL LOW (ref 35.0–47.0)
HGB: 9.6 g/dL — ABNORMAL LOW (ref 12.0–16.0)
LYMPHS ABS: 3.1 x10 3/mm (ref 1.0–3.6)
LYMPHS PCT: 7.4 %
MCH: 35.8 pg — AB (ref 26.0–34.0)
MCHC: 33.1 g/dL (ref 32.0–36.0)
MCV: 108 fL — AB (ref 80–100)
MONO ABS: 1.5 x10 3/mm — AB (ref 0.2–0.9)
Monocyte %: 3.5 %
NEUTROS ABS: 37 x10 3/mm — AB (ref 1.4–6.5)
NEUTROS PCT: 88.8 %
Platelet: 339 x10 3/mm (ref 150–440)
RBC: 2.68 10*6/uL — ABNORMAL LOW (ref 3.80–5.20)
RDW: 23.9 % — ABNORMAL HIGH (ref 11.5–14.5)
WBC: 41.6 x10 3/mm — AB (ref 3.6–11.0)

## 2014-03-11 LAB — COMPREHENSIVE METABOLIC PANEL
ALT: 56 U/L (ref 12–78)
ANION GAP: 11 (ref 7–16)
Albumin: 2.9 g/dL — ABNORMAL LOW (ref 3.4–5.0)
Alkaline Phosphatase: 60 U/L
BUN: 19 mg/dL — AB (ref 7–18)
Bilirubin,Total: 0.4 mg/dL (ref 0.2–1.0)
CALCIUM: 8.9 mg/dL (ref 8.5–10.1)
CHLORIDE: 97 mmol/L — AB (ref 98–107)
Co2: 29 mmol/L (ref 21–32)
Creatinine: 0.49 mg/dL — ABNORMAL LOW (ref 0.60–1.30)
EGFR (Non-African Amer.): >60
GLUCOSE: 119 mg/dL — AB (ref 65–99)
Osmolality: 277 (ref 275–301)
Potassium: 3.7 mmol/L (ref 3.5–5.1)
SGOT(AST): 29 U/L (ref 15–37)
SODIUM: 137 mmol/L (ref 136–145)
TOTAL PROTEIN: 5.8 g/dL — AB (ref 6.4–8.2)

## 2014-03-11 LAB — MAGNESIUM: MAGNESIUM: 1.8 mg/dL

## 2014-03-22 ENCOUNTER — Ambulatory Visit: Payer: Self-pay | Admitting: Internal Medicine

## 2014-05-11 ENCOUNTER — Emergency Department: Payer: Self-pay | Admitting: Emergency Medicine

## 2014-05-19 ENCOUNTER — Ambulatory Visit: Payer: Self-pay | Admitting: Internal Medicine

## 2014-09-22 DEATH — deceased

## 2015-04-23 IMAGING — CT CT HEAD WITHOUT CONTRAST
2 of 5 series · 13 of 47 positions shown, 16 images · non-contrast
Comparison: None.

No comparisons

CLINICAL DATA: recent mental decline per husband, today fall in the
shower, lt temp. hematoma, garbled speech, decresed loc, no known hx
of ca or neuro hx.Recent mental decline per husband, today fall in
the shower, lt temp. hematoma, garbled speech, decresed loc, no
known hx of ca or neuro hx.

EXAM:
CT HEAD WITHOUT CONTRAST
CT CERVICAL SPINE WITHOUT CONTRAST
TECHNIQUE: Multidetector CT imaging of the head and cervical spine was
performed following the standard protocol without intravenous
contrast. Multiplanar CT image reconstructions of the cervical spine
were also generated.

[Series 5: soft tissue · axial · 0.29mm/px · z∈[-272,-154]mm · 10 of 72 slices shown, 13 images]
[im 7/72  brain]
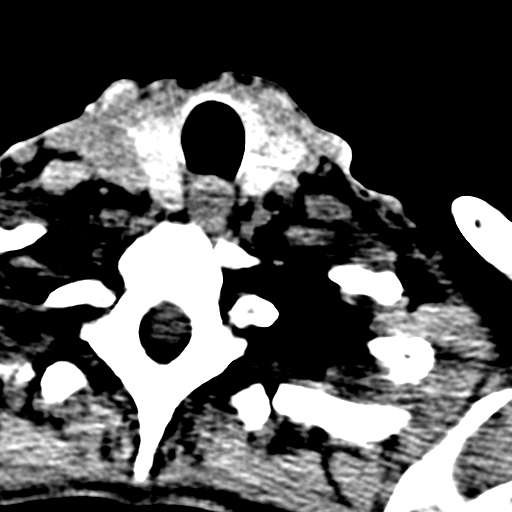
[im 7/72  bone]
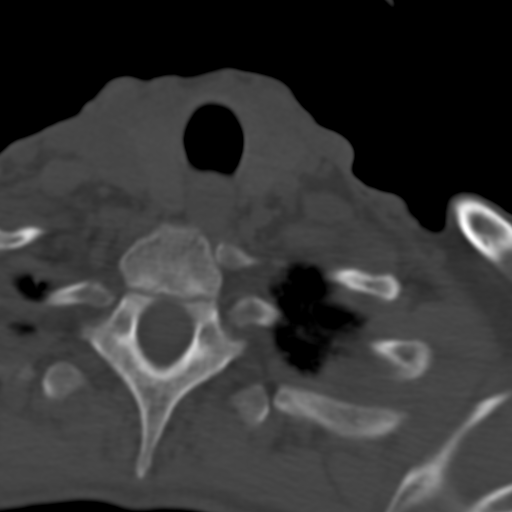
[im 13/72  brain]
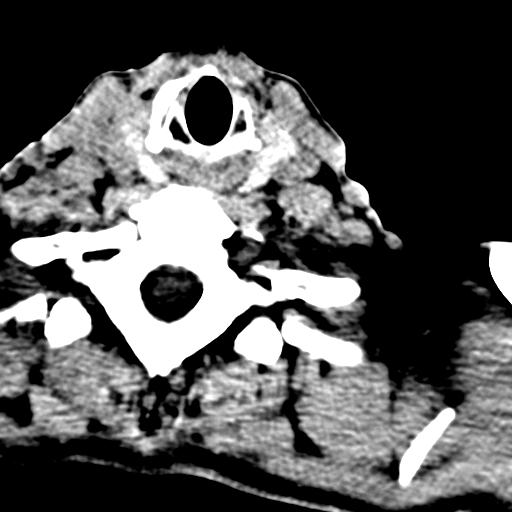
[im 20/72  brain]
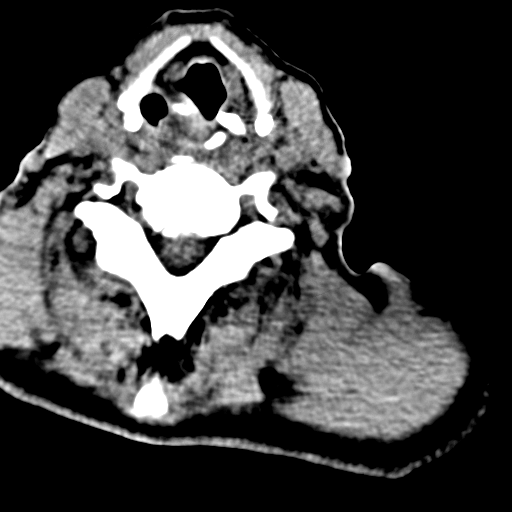
[im 26/72  brain]
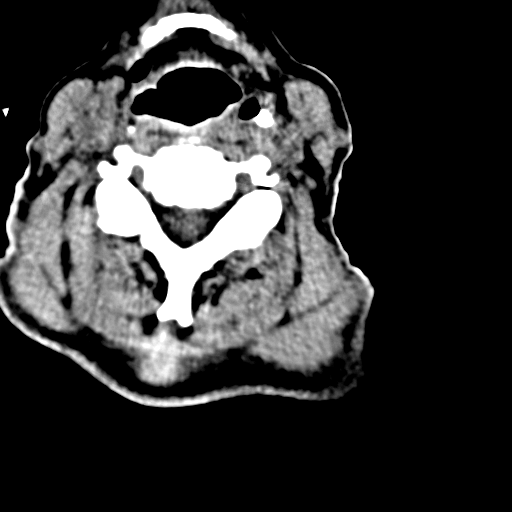
[im 33/72  brain]
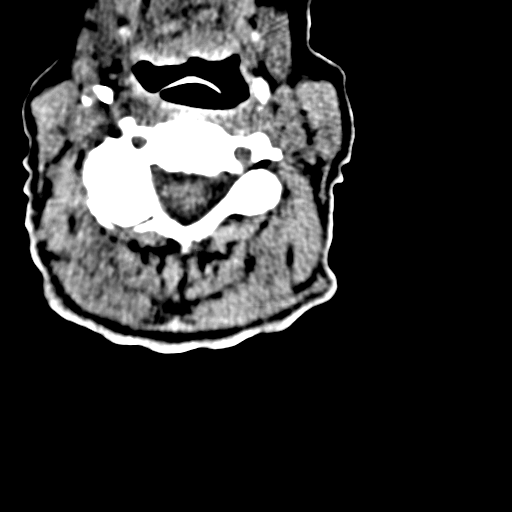
[im 33/72  bone]
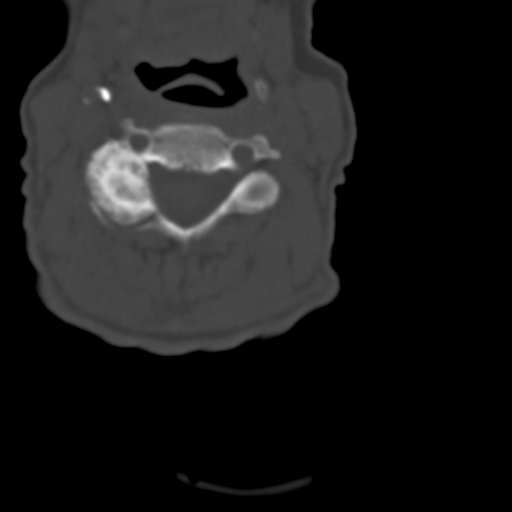
[im 39/72  brain]
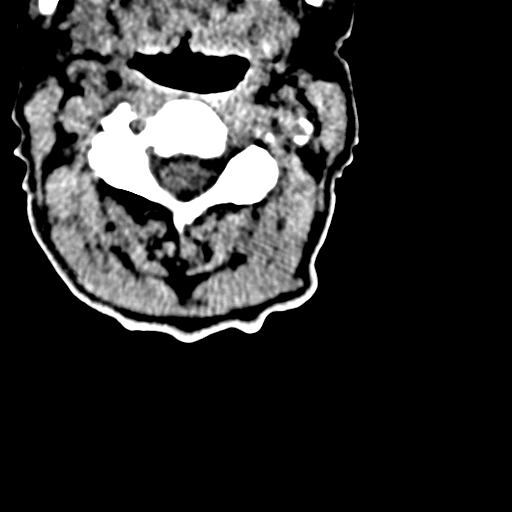
[im 46/72  brain]
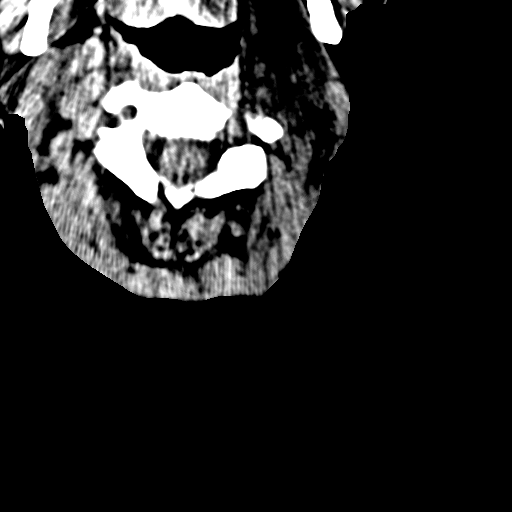
[im 52/72  brain]
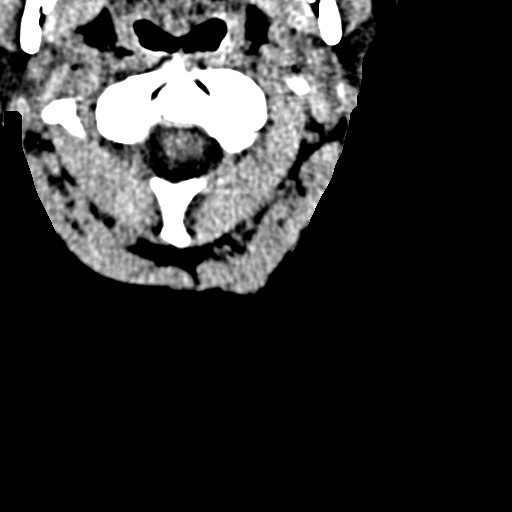
[im 59/72  brain]
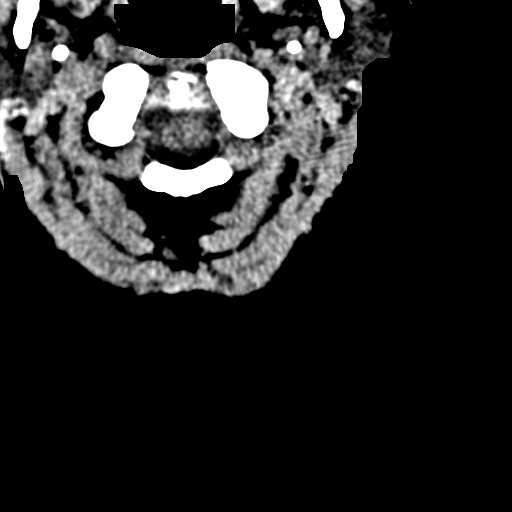
[im 59/72  bone]
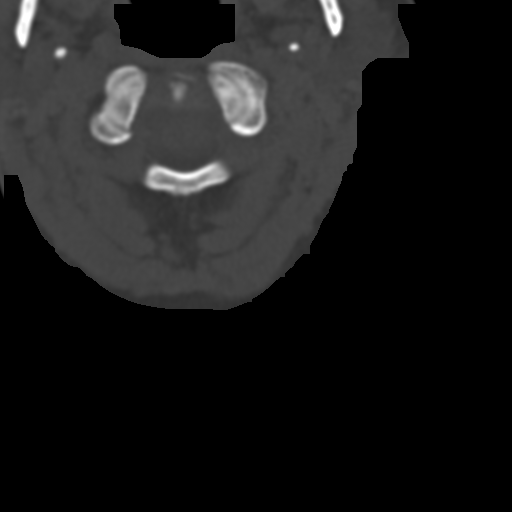
[im 65/72  brain]
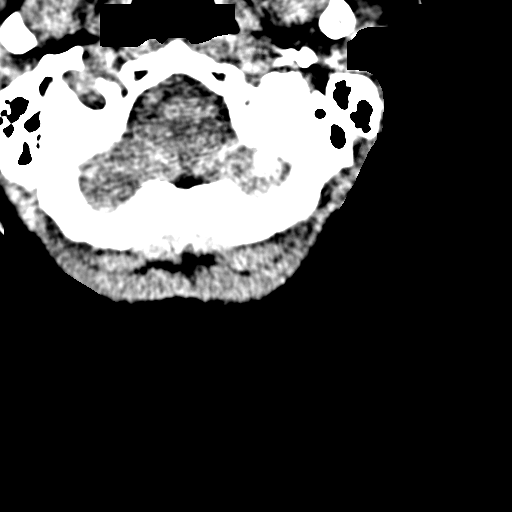

[Series 9: coronal bone · coronal · 0.19mm/px · 3 of 39 slices shown]
[im 13/39  brain]
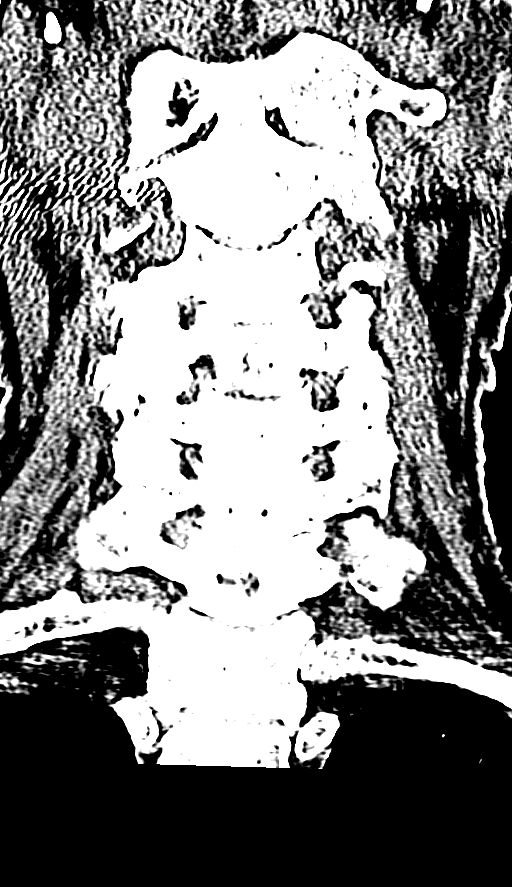
[im 17/39  brain]
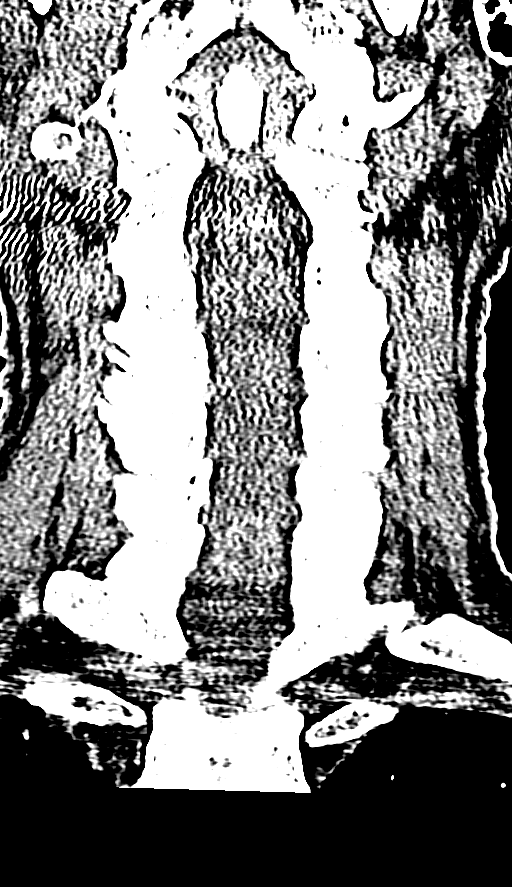
[im 22/39  brain]
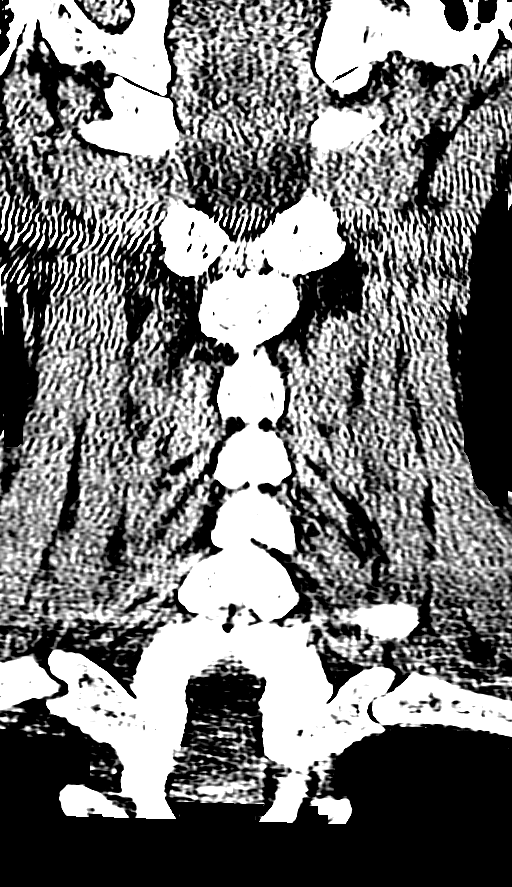

[13 of 47 positions shown; findings below may reference images not displayed]

FINDINGS: CT HEAD FINDINGS

A 5 cm intermediate to perineural, patients like high attenuating
mass projects within the posterior lateral right parietal region.
Central area of low attenuation is identified likely reflecting
necrosis as well as punctate areas of intense attenuation concerning
for areas of hemorrhage. There is surrounding vasogenic and
cytotoxic edema. 12 mm of subfalcine herniation is associated with
this finding as well as partial effacement of the right lateral
ventricle. A second 2 cm focus projects along the apex of this mass
which may represent a loculated component or possibly an adjacent
smaller mass.

A second 2 cm mass projects within the anterior portion of the right
frontal lobe with surrounding vasogenic and cytotoxic edema and
peripheral areas of intense attenuation concerning for areas of
hemorrhage.

No further evidence of extra-axial fluid collections. No further
regions of acute hemorrhage. There is diffuse sulcal effacement
within the right and left hemispheres.

A scalp hematoma is appreciated within the left frontal region.

The parapontine cisterns, third and fourth ventricle appear patent.

There is no evidence of a depressed skull fracture.

CT CERVICAL SPINE FINDINGS

There is no evidence of fracture nor dislocation, canal stenosis,
nor malalignment. There is no evidence prevertebral soft tissue
swelling. Areas of mild disc space narrowing appreciated within the
lower cervical spine as well as regions of mild peripheral
hypertrophic spurring. Areas of facet sclerosis and hypertrophy at
the AC 3 for, C4-5, and C5-6 levels on the right.
IMPRESSION: 1. Multiple intracranial masses largest in the right posterior
parietal region. There are areas concerning for components of
punctate hemorrhage in the dominant mass as well as a mass within
the right frontal region. Differential considerations include
primary brain neoplasm with metachronous metastases, verses non
neuro etiologies such as breast or lung primaries.
2. 12 mm of subfalcine herniation is appreciated
3. Vasogenic and cytotoxic edema associated with the masses
4. There findings concerning for diffuse parenchymal edema
5. These results were called by telephone at the time of
interpretation on 11/11/2013 at [DATE] to Dr. POZISA TIGER , who
verbally acknowledged these results.
6. Neuro surgical consultation recommended.

## 2015-04-25 IMAGING — CT CT HEAD W/O CM
2 series · 16 of 30 positions shown, 18 images · non-contrast
Comparison: MR HEAD WO/W CM dated 11/11/2013; CT HEAD W/O CM dated
11/11/2013

CLINICAL DATA: Follow-up brain tumor resection.

EXAM:
CT HEAD WITHOUT CONTRAST
TECHNIQUE: Contiguous axial images were obtained from the base of the skull
through the vertex without intravenous contrast.

[Series 4: head w/o · axial · non-contrast · 0.49mm/px · z∈[+120,+244]mm · 8 of 33 slices shown, 10 images]
[im 4/33  brain]
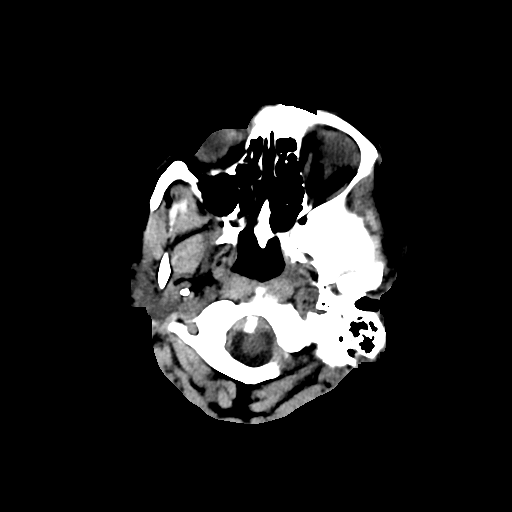
[im 4/33  bone]
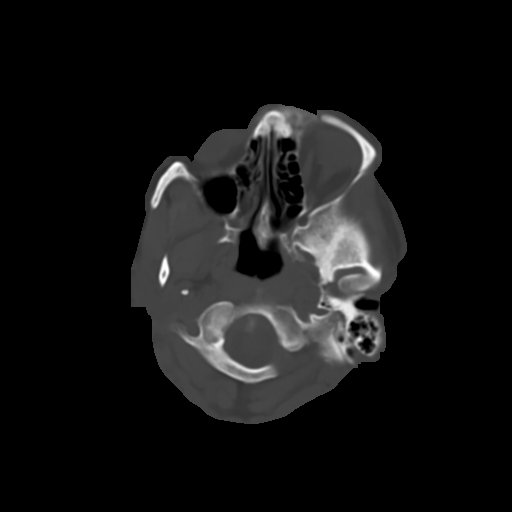
[im 8/33  brain]
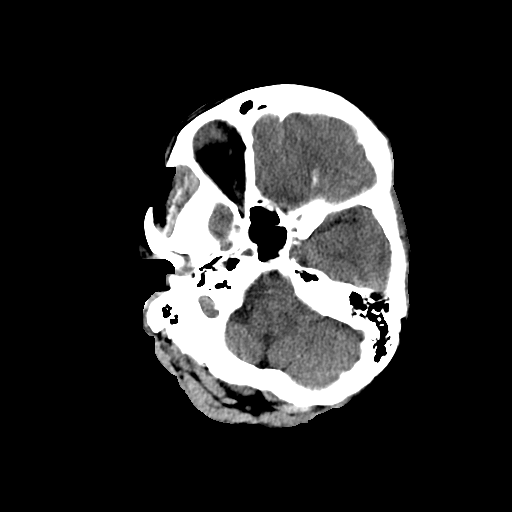
[im 11/33  brain]
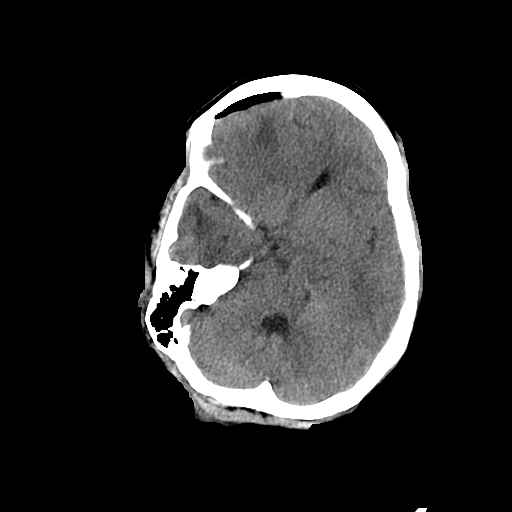
[im 15/33  brain]
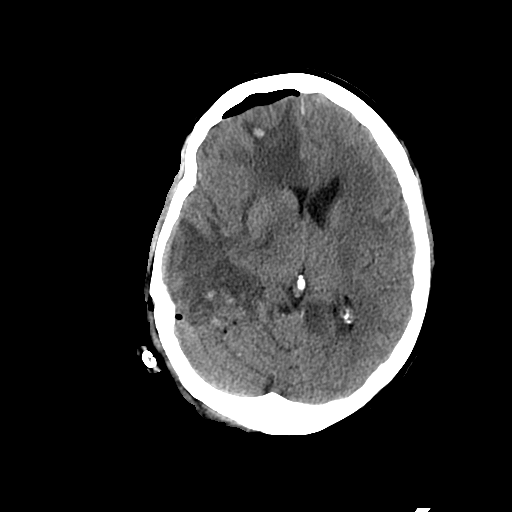
[im 18/33  brain]
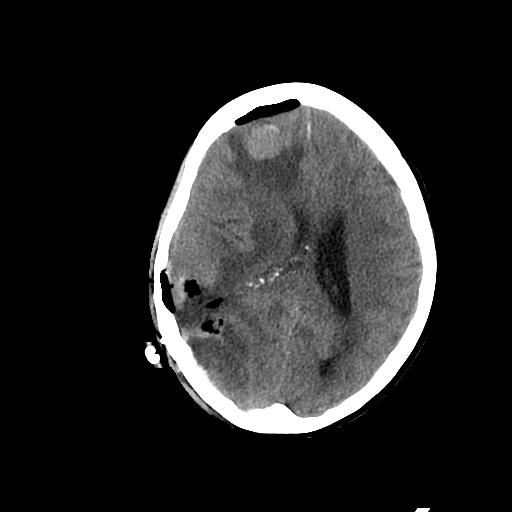
[im 18/33  bone]
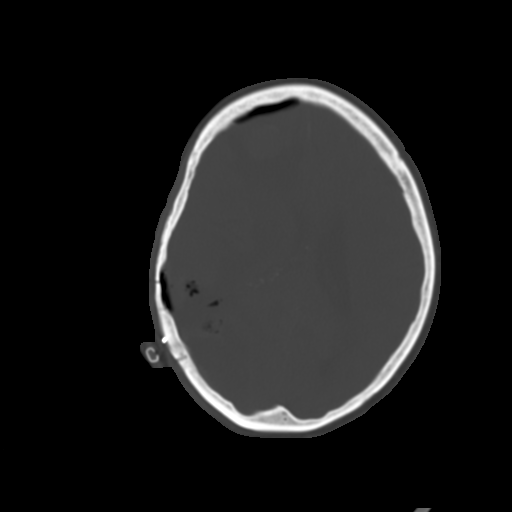
[im 22/33  brain]
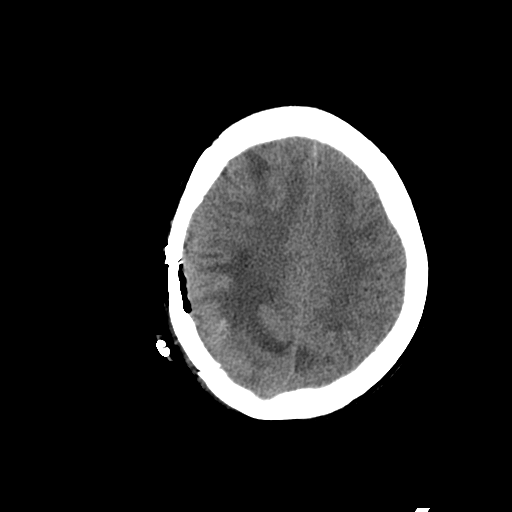
[im 25/33  brain]
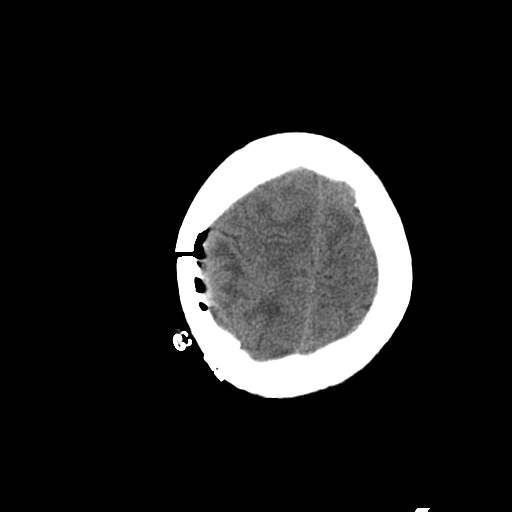
[im 29/33  brain]
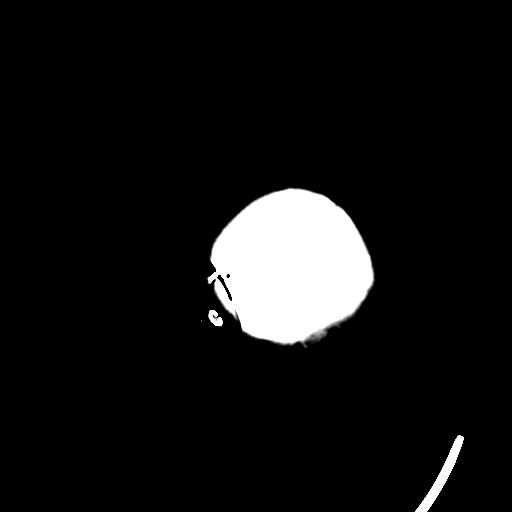

[Series 5: head w/o bone · axial · non-contrast · 0.49mm/px · z∈[+120,+237]mm · 8 of 65 slices shown]
[im 7/65  bone]
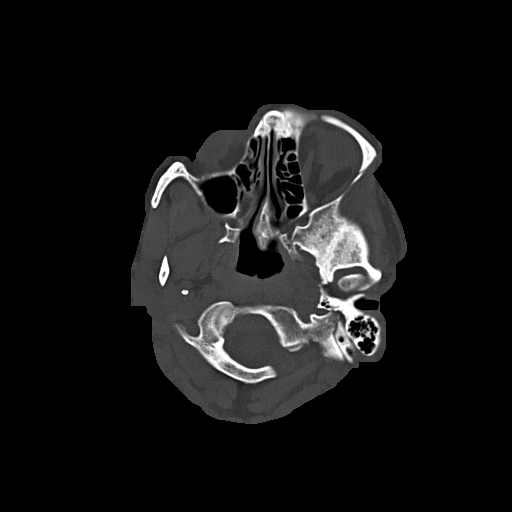
[im 14/65  bone]
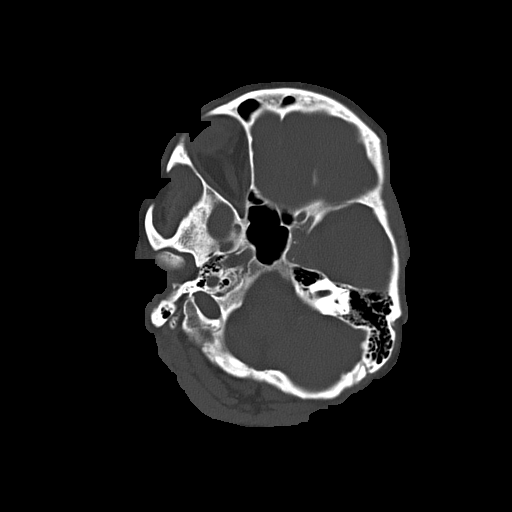
[im 21/65  bone]
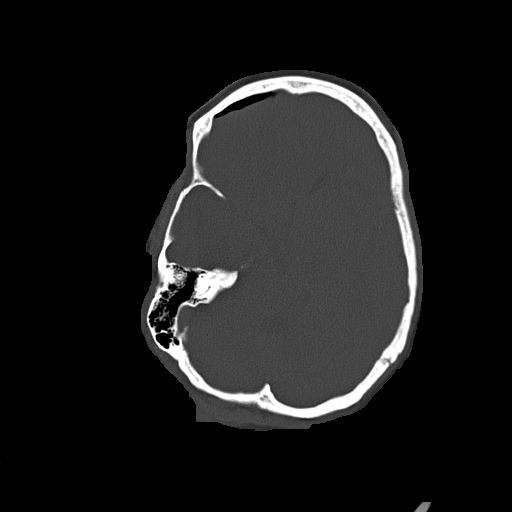
[im 27/65  bone]
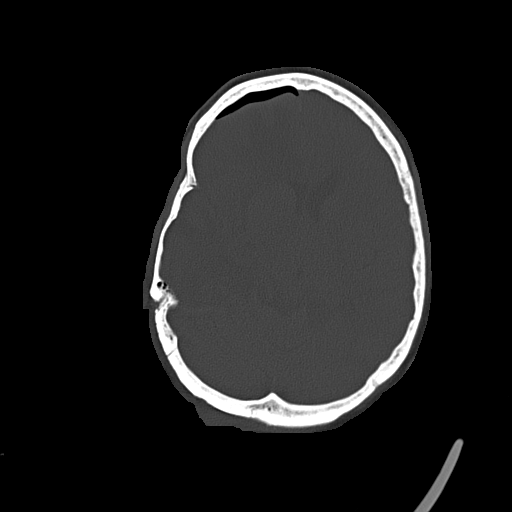
[im 34/65  bone]
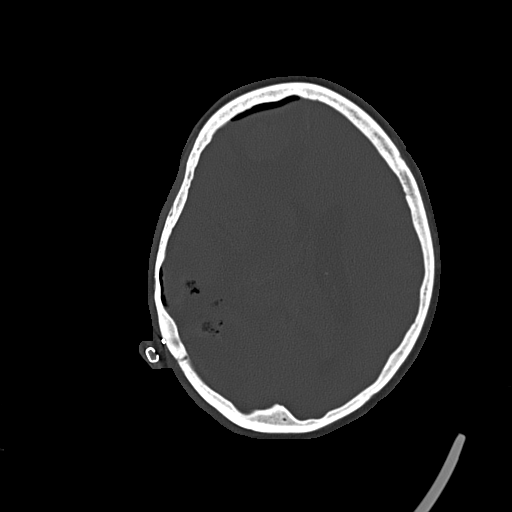
[im 41/65  bone]
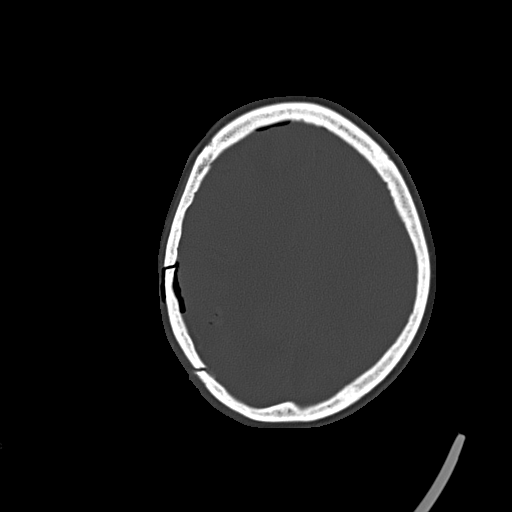
[im 48/65  bone]
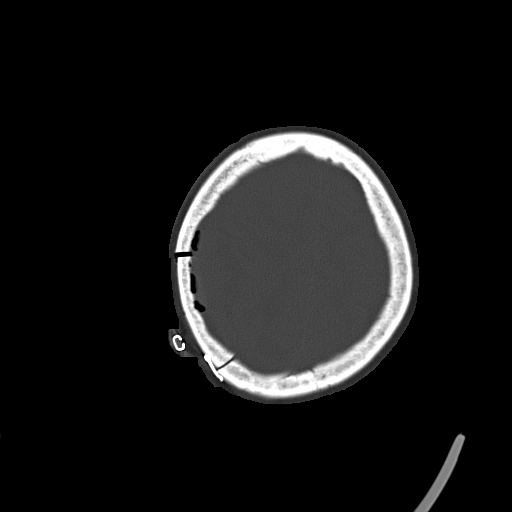
[im 54/65  bone]
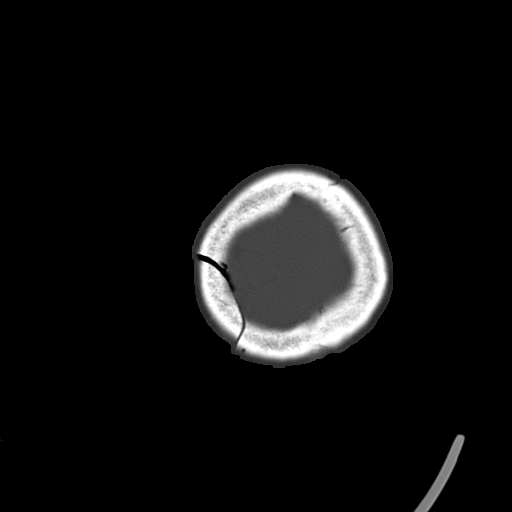

[16 of 30 positions shown; findings below may reference images not displayed]

FINDINGS: Status post interval right frontal parietal craniotomy for
debulking/ resection of right temporoparietal mass. Small amount of
hyperdense blood products and air within the resection cavity. Small
amount of extra-axial pneumocephalus mass seen.

No apparent change in mildly hyperdense right frontal lobe 22 x 19
mm mass, with a subcentimeter focus along the inferior margin of
hyperdense possible blood products, axial 15/33. Right cerebral
probable vasogenic edema, corresponding to MRI abnormality. 10 mm
right to left midline shift, previously 12 mm. The right lateral
ventricle remains predominantly the face, however the left ventricle
system is decompressed, no entrapment on today's examination. Patchy
hypodensity within the left mesial parietal lobe, axial 20/ 33 and
evolving the gray and white matter.

Slight re-expansion of supercerebellar cistern, with persistent
effacement of the quadrigeminal/ambient cistern. Very mild right
uncal herniation is similar.

Mild calcific atherosclerosis of the carotid siphons. Visualized
paranasal sinuses remain well aerated. Included ocular globes and
orbital contents are nonsuspicious.
IMPRESSION: Status post interval right frontal parietal craniotomy for
debulking/ resection of known right temporal parietal mass, with
expected postoperative changes including minimal air and blood
within the resection cavity. Small amount of extra-axial
pneumocephalus. Overall less mass effect with residual 10 mm right
to left midline shift (improved), without hydrocephalus or
ventricular entrapment on today's study.

Focal loss of the gray-white matter junction involving the mesial
left parietal lobe concerning for acute ischemia.

Stable appearance of 22 x 19 mm right frontal lobe mass, however
there is a subcentimeter focus of apparent blood along the inferior
margin of the mass which appears new.

  By: Delma Tomko
